# Patient Record
Sex: Female | Born: 1990
Health system: Southern US, Community
[De-identification: ages and names within clinical notes are randomized; demographics above are authoritative.]

## PROBLEM LIST (undated history)

## (undated) ENCOUNTER — Inpatient Hospital Stay (HOSPITAL_COMMUNITY): Payer: Self-pay

## (undated) ENCOUNTER — Inpatient Hospital Stay (HOSPITAL_COMMUNITY): Payer: Medicaid Other

## (undated) DIAGNOSIS — J40 Bronchitis, not specified as acute or chronic: Secondary | ICD-10-CM

## (undated) DIAGNOSIS — F4310 Post-traumatic stress disorder, unspecified: Secondary | ICD-10-CM

## (undated) DIAGNOSIS — G479 Sleep disorder, unspecified: Secondary | ICD-10-CM

## (undated) DIAGNOSIS — G4700 Insomnia, unspecified: Secondary | ICD-10-CM

## (undated) DIAGNOSIS — F419 Anxiety disorder, unspecified: Secondary | ICD-10-CM

## (undated) DIAGNOSIS — K912 Postsurgical malabsorption, not elsewhere classified: Secondary | ICD-10-CM

## (undated) DIAGNOSIS — E282 Polycystic ovarian syndrome: Secondary | ICD-10-CM

## (undated) DIAGNOSIS — G4730 Sleep apnea, unspecified: Secondary | ICD-10-CM

## (undated) DIAGNOSIS — S61259A Open bite of unspecified finger without damage to nail, initial encounter: Secondary | ICD-10-CM

## (undated) HISTORY — PX: GASTRIC BYPASS: SHX52

## (undated) HISTORY — DX: Bronchitis, not specified as acute or chronic: J40

## (undated) HISTORY — PX: MOUTH SURGERY: SHX715

## (undated) HISTORY — PX: WISDOM TOOTH EXTRACTION: SHX21

---
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
## 2011-06-17 ENCOUNTER — Inpatient Hospital Stay (HOSPITAL_COMMUNITY)
Admission: EM | Admit: 2011-06-17 | Discharge: 2011-06-20 | DRG: 203 | Disposition: A | Discharge status: Home / Self Care | Payer: Self-pay | Attending: Internal Medicine | Admitting: Internal Medicine

## 2011-06-17 ENCOUNTER — Emergency Department (HOSPITAL_COMMUNITY): Payer: Self-pay

## 2011-06-17 DIAGNOSIS — I498 Other specified cardiac arrhythmias: Secondary | ICD-10-CM | POA: Diagnosis present

## 2011-06-17 DIAGNOSIS — F410 Panic disorder [episodic paroxysmal anxiety] without agoraphobia: Secondary | ICD-10-CM | POA: Diagnosis present

## 2011-06-17 DIAGNOSIS — F17200 Nicotine dependence, unspecified, uncomplicated: Secondary | ICD-10-CM | POA: Diagnosis present

## 2011-06-17 DIAGNOSIS — T380X5A Adverse effect of glucocorticoids and synthetic analogues, initial encounter: Secondary | ICD-10-CM | POA: Diagnosis not present

## 2011-06-17 DIAGNOSIS — J45901 Unspecified asthma with (acute) exacerbation: Principal | ICD-10-CM | POA: Diagnosis present

## 2011-06-17 DIAGNOSIS — Z79899 Other long term (current) drug therapy: Secondary | ICD-10-CM

## 2011-06-17 DIAGNOSIS — D72829 Elevated white blood cell count, unspecified: Secondary | ICD-10-CM | POA: Diagnosis not present

## 2011-06-17 DIAGNOSIS — T486X5A Adverse effect of antiasthmatics, initial encounter: Secondary | ICD-10-CM | POA: Diagnosis present

## 2011-06-18 ENCOUNTER — Inpatient Hospital Stay (HOSPITAL_COMMUNITY): Payer: Self-pay

## 2011-06-18 LAB — RAPID URINE DRUG SCREEN, HOSP PERFORMED
Amphetamines: NOT DETECTED
Barbiturates: NOT DETECTED
Benzodiazepines: NOT DETECTED
Cocaine: NOT DETECTED
Opiates: POSITIVE — AB
Tetrahydrocannabinol: NOT DETECTED

## 2011-06-18 LAB — CK TOTAL AND CKMB (NOT AT ARMC)
CK, MB: 2 ng/mL (ref 0.3–4.0)
Relative Index: INVALID (ref 0.0–2.5)
Total CK: 81 U/L (ref 7–177)

## 2011-06-18 LAB — URINALYSIS, ROUTINE W REFLEX MICROSCOPIC
Bilirubin Urine: NEGATIVE
Glucose, UA: >1000 mg/dL — AB
Hgb urine dipstick: NEGATIVE
Ketones, ur: 15 mg/dL — AB
Leukocytes, UA: NEGATIVE
Nitrite: NEGATIVE
Protein, ur: NEGATIVE mg/dL
Specific Gravity, Urine: 1.04 — ABNORMAL HIGH (ref 1.005–1.030)
Urobilinogen, UA: 0.2 mg/dL (ref 0.0–1.0)
pH: 5.5 (ref 5.0–8.0)

## 2011-06-18 LAB — POCT I-STAT 3, ART BLOOD GAS (G3+)
Acid-base deficit: 7 mmol/L — ABNORMAL HIGH (ref 0.0–2.0)
Bicarbonate: 16.2 mEq/L — ABNORMAL LOW (ref 20.0–24.0)
O2 Saturation: 97 %
TCO2: 17 mmol/L (ref 0–100)
pCO2 arterial: 26.1 mmHg — ABNORMAL LOW (ref 35.0–45.0)
pH, Arterial: 7.401 — ABNORMAL HIGH (ref 7.350–7.400)
pO2, Arterial: 84 mmHg (ref 80.0–100.0)

## 2011-06-18 LAB — URINE MICROSCOPIC-ADD ON

## 2011-06-18 LAB — DIFFERENTIAL
Basophils Absolute: 0 10*3/uL (ref 0.0–0.1)
Basophils Relative: 0 % (ref 0–1)
Eosinophils Absolute: 0 10*3/uL (ref 0.0–0.7)
Eosinophils Relative: 0 % (ref 0–5)
Lymphocytes Relative: 13 % (ref 12–46)
Lymphs Abs: 1.4 10*3/uL (ref 0.7–4.0)
Monocytes Absolute: 0.3 10*3/uL (ref 0.1–1.0)
Monocytes Relative: 3 % (ref 3–12)
Neutro Abs: 8.8 10*3/uL — ABNORMAL HIGH (ref 1.7–7.7)
Neutrophils Relative %: 84 % — ABNORMAL HIGH (ref 43–77)

## 2011-06-18 LAB — POCT I-STAT, CHEM 8
BUN: 11 mg/dL (ref 6–23)
Calcium, Ion: 1.12 mmol/L (ref 1.12–1.32)
Chloride: 109 mEq/L (ref 96–112)
Creatinine, Ser: 1 mg/dL (ref 0.50–1.10)
Glucose, Bld: 133 mg/dL — ABNORMAL HIGH (ref 70–99)
HCT: 41 % (ref 36.0–46.0)
Hemoglobin: 13.9 g/dL (ref 12.0–15.0)
Potassium: 3.5 mEq/L (ref 3.5–5.1)
Sodium: 138 mEq/L (ref 135–145)
TCO2: 17 mmol/L (ref 0–100)

## 2011-06-18 LAB — CBC
HCT: 41.4 % (ref 36.0–46.0)
Hemoglobin: 14.8 g/dL (ref 12.0–15.0)
MCH: 30 pg (ref 26.0–34.0)
MCHC: 35.7 g/dL (ref 30.0–36.0)
MCV: 84 fL (ref 78.0–100.0)
Platelets: 240 10*3/uL (ref 150–400)
RBC: 4.93 MIL/uL (ref 3.87–5.11)
RDW: 12.2 % (ref 11.5–15.5)
WBC: 10.5 10*3/uL (ref 4.0–10.5)

## 2011-06-18 LAB — D-DIMER, QUANTITATIVE (NOT AT ARMC): D-Dimer, Quant: 0.22 ug/mL-FEU (ref 0.00–0.48)

## 2011-06-18 LAB — POCT PREGNANCY, URINE: Preg Test, Ur: NEGATIVE

## 2011-06-18 LAB — TROPONIN I: Troponin I: <0.3 ng/mL (ref <0.30)

## 2011-06-18 NOTE — H&P (Signed)
NAMESHARLETT, [name]              ACCOUNT NO.:  [id number]  MEDICAL RECORD NO.:  [id number]  LOCATION:  [facility]                         FACILITY:  [facility]  PHYSICIAN:  Osvaldo Shipper, MD     DATE OF BIRTH:  Feb 10, 1991  DATE OF ADMISSION:  06/18/2011 DATE OF DISCHARGE:                             HISTORY & PHYSICAL   PRIMARY CARE PHYSICIAN:  Social research officer, government in [place], [place].  ADMISSION DIAGNOSES: 1. Acute asthma exacerbation. 2. Tachycardia, likely as a result of respiratory failure and     distress. 3. History of anxiety disorder and panic attacks. 4. Tobacco abuse. 5. Nausea and vomiting as a result of the above.  CHIEF COMPLAINT:  Shortness of breath and cough for the last 4-5 days.  HISTORY OF PRESENT ILLNESS:  The patient is a 20 year old Caucasian female with a past medical history of asthma who was in her usual state of health until this past Monday when she started having facial pain, some sinus tenderness.  On Tuesday, she had shortness of breath, choking spells, and some cough.  On Wednesday, she also developed chest tightness along with other symptoms and so she went to an urgent care center.  She was diagnosed with bronchitis and sinusitis, was prescribed Bactrim, Allegra, and Xopenex inhaler.  She tried taking all these medicines.  Her symptoms keep kept getting worse.  She kept having more wheezing, more difficulty breathing and so she decided to come into the hospital tonight for further evaluation.  The cough has yellowish- greenish expectoration with specks of blood in it.  She tells me that as a result of her severe coughing spells she has nausea and vomiting.  She has not been able to eat or drink anything for the last couple of days. Denies any fever per se, but has not been able to check her temperature at home, but does admit to her chills and sweats.  She does have a history of asthma, has had bronchitis, but never this severe.   Chest tightness is mainly associated with the shortness of breath and cough.  MEDICATIONS AT HOME:  Bactrim 800 x 60 one tablet twice daily.  This was prescribed in the urgent care.  She is also on Allegra and albuterol inhaler and the Xopenex nebulizers.  She is also on NuvaRing at home and Ativan 1 mg as needed for sleep and for anxiety.  ALLERGIES:  No known drug allergies.  PAST MEDICAL HISTORY:  Positive for seasonal asthma, bronchitis.  She has had wisdom tooth removal, but denies any other surgical procedures. Denies any other medical problems.  SOCIAL HISTORY:  Lives in [place] with her fiance.  She works as a Patent attorney.  She smokes 1 pack of cigarettes, which lasts her a week ago.  Occasional alcohol use.  No illicit drug use.  FAMILY HISTORY:  Father side has diabetes, heart failure, liver disease, and lung cancer.  Mother side has heart disease and asthma.  REVIEW OF SYSTEMS:  GENERAL:  Positive for profound weakness and malaise.  HEENT:  Positive for facial pain.  CARDIOVASCULAR:  As in HPI. RESPIRATORY:  As in HPI.  GI:  Unremarkable except as in HPI.  GU: Unremarkable.  NEUROLOGICAL:  Unremarkable.  PSYCHIATRIC:  Unremarkable. DERMATOLOGICAL:  Unremarkable.  Other systems reviewed and found to be negative.  PHYSICAL EXAMINATION:  VITAL SIGNS:  Temperature 98.1.  Blood pressure initially when she came in was 150/93, subsequently 133/74.  Heart rate ranging from anywhere from 90-140s, right now appears to be regular. Respiratory rate is about 24-30 breaths per minute.  Saturation 97-100% on room air. GENERAL:  Obese, white female, in moderate discomfort, but in no distress at this time. HEENT:  Head is normocephalic and atraumatic.  Pupils are equal and reacting.  No pallor.  No icterus.  Oral mucous membranes are slightly dry.  No oral lesions are noted.  She does have sinus tenderness.  She had right frontal sinus tenderness more than the left and  she also has bilateral maxillary sinus tenderness.  There is also some erythema over her maxillary sinuses over the face. NECK:  Soft and supple.  No thyromegaly is appreciated.  No cervical, supraclavicular, or inguinal lymphadenopathy is present. LUNGS:  Diffuse wheezing bilaterally without any definite crackles. CARDIOVASCULAR:  S1 and S2, tachycardic, regular.  No S3, S4, rubs, murmurs, or bruits.  No pedal edema.  No JVD. ABDOMEN:  Soft, nontender, and nondistended.  It is obese.  No organomegaly is appreciated.  Bowel sounds are present. GU:  Deferred. MUSCULOSKELETAL:  Normal muscle mass and tone. NEUROLOGIC:  She is alert and oriented x3.  No focal neurological deficits are present. SKIN:  Does not reveal any rashes.  LAB DATA:  Her CBC shows a normal white count, hemoglobin is 13.9, platelet count is 240.  There are 84% neutrophils on the differential. Electrolytes are all normal.  Renal function is normal.  She had a chest x-ray here which showed moderate changes of bronchitis and/or asthma without any acute other cardiopulmonary process.  EKG is pending at this time.  D-dimer ordered by the ED physician is also pending.  Cardiac enzymes are pending.  ASSESSMENT:  This is a 20 year old Caucasian female with a history of seasonal asthma who presents with 4-5 day history of worsening shortness of breath, wheezing, cough, and most likely had acute asthma exacerbation.  She also has facial tenderness and could have acute sinusitis as well.  She has sinus tachycardia, which is most likely from her respiratory failure.  She also has tobacco abuse.  PLAN: 1. Acute asthma exacerbation.  Will be treated with steroids,     antibiotics, and nebulizer treatments.  We will also give her     inhaled steroids.  Because of her work of breathing and the     tachycardia, she will be monitored in the Step-Down Unit for the     next 12 hours. 2. Sinus tachycardia.  We will get an EKG to  evaluate this further.     We will avoid albuterol and give her Xopenex instead. 3. Facial tenderness, possible sinusitis.  We will get a CT of her     sinuses to evaluate this further. 4. History of anxiety and panic attacks.  Give her Ativan as needed. 5. History of tobacco abuse.  Put her on nicotine patch. 6. We will check a urine pregnancy test.  ABG is also pending at this time.  Her nausea and vomiting is probably posttussive.  We will give her ice chips for now and as her symptoms improve her diet can be advanced.  DVT prophylaxis will be initiated.  Further management decisions will depend on results of further testing  and patient's response to treatment.   Osvaldo Shipper, MD     GK/MEDQ  D:  06/18/2011  T:  06/18/2011  Job:  [id number]  cc:   [facility]  Electronically Signed by Osvaldo Shipper MD on 06/18/2011 10:13:08 PM

## 2011-06-19 LAB — BASIC METABOLIC PANEL
BUN: 11 mg/dL (ref 6–23)
CO2: 21 mEq/L (ref 19–32)
Calcium: 9.8 mg/dL (ref 8.4–10.5)
Chloride: 107 mEq/L (ref 96–112)
Creatinine, Ser: 0.62 mg/dL (ref 0.50–1.10)
GFR calc Af Amer: >90 mL/min (ref >90)
GFR calc non Af Amer: >90 mL/min (ref >90)
Glucose, Bld: 160 mg/dL — ABNORMAL HIGH (ref 70–99)
Potassium: 4.3 mEq/L (ref 3.5–5.1)
Sodium: 139 mEq/L (ref 135–145)

## 2011-06-19 LAB — CBC
HCT: 39.7 % (ref 36.0–46.0)
Hemoglobin: 13.6 g/dL (ref 12.0–15.0)
MCH: 29.7 pg (ref 26.0–34.0)
MCHC: 34.3 g/dL (ref 30.0–36.0)
MCV: 86.7 fL (ref 78.0–100.0)
Platelets: 240 10*3/uL (ref 150–400)
RBC: 4.58 MIL/uL (ref 3.87–5.11)
RDW: 12.5 % (ref 11.5–15.5)
WBC: 16.6 10*3/uL — ABNORMAL HIGH (ref 4.0–10.5)

## 2011-06-21 NOTE — Discharge Summary (Signed)
NAMEJERRIS, [name]              ACCOUNT NO.:  [id number]  MEDICAL RECORD NO.:  [id number]  LOCATION:  4706                         FACILITY:  [facility]  PHYSICIAN:  Jeoffrey Massed, MD    DATE OF BIRTH:  13-Dec-1990  DATE OF ADMISSION:  06/17/2011 DATE OF DISCHARGE:                        DISCHARGE SUMMARY - REFERRING   PRIMARY CARE PRACTITIONER:  At [facility] in [place].  PRIMARY DISCHARGE DIAGNOSES: 1. Acute asthma exacerbation. 2. Sinus tachycardia secondary to anxiety and albuterol, now resolved. 3. Ongoing tobacco abuse.  SECONDARY DISCHARGE DIAGNOSES/PAST MEDICAL HISTORY: 1. History of bronchial asthma. 2. History of anxiety disorder and panic attacks.  DISCHARGE MEDICATIONS: 1. Flovent 110 mcg 2 puffs inhaled twice daily. 2. Avelox 400 mg one tablet daily. 3. Albuterol inhaler 1-2 puffs inhaled 4 times a day p.r.n. 4. Allegra one tablet p.o. daily. 5. Lorazepam 0.5 mg one tablet p.o. daily at bedtime. 6. NuvaRing one ring per vaginally monthly. 7. Promethazine DM one teaspoon p.o. 4 times a day p.r.n. cough. 8. Xopenex nebulizer 0.63 mg one nebulizer inhaled q.4-6 h. p.r.n. 9. Prednisone 40 mg one tablet p.o. daily from June 21, 2011 to     June 23, 2011. 10.Prednisone 30 mg p.o. daily from June 24, 2011 to June 26, 2011. 11.Prednisone 20 mg p.o. daily from June 27, 2011 to June 29, 2011. 12.Prednisone 10 mg p.o. daily from June 30, 2011 to July 02, 2011 and discontinue.  CONSULTANTS ON THE CASE:  None.  BRIEF HISTORY OF PRESENT ILLNESS:  The patient is a 20 year old Caucasian female with the above-noted medical issues, who was brought in for shortness of breath and tachycardia.  For further details, please see the history and physical that was dictated by Dr. Osvaldo Shipper.  PERTINENT RADIOLOGICAL STUDIES: 1. X-ray of the chest done on June 17, 2011 showed moderate changes     of  bronchitis and asthma. 2. CT of the maxillofacial region without contrast showed mucosal     thickening in the paranasal sinus, compatible with chronic     sinusitis.  BRIEF HOSPITAL COURSE: 1. Bronchial asthma exacerbation plus the patient presented with     shortness of breath and tachycardia.  Although, she was admitted to     the step-down unit, she quickly made improvement and was then     downgraded to a regular medical bed.  She was given intravenous     steroids and nebulized albuterol and Atrovent.  With this she made     very quick clinical improvement and currently is almost back to her     usual baseline.  She will be discharged on a tapering dose of     steroids and the above-noted inhalers. 2. Tachycardia.  This was sinus tachycardia secondary to respiratory     distress, anxiety, and perhaps albuterol nebulization.  She is now     significantly better and her heart rate is down back to sinus     rhythm.  PHYSICAL EXAMINATION ON DISCHARGE:  VITAL SIGNS:  Shows a temperature to be afebrile, heart rate of 74, respiratory rate of 16, and a pulse  ox of 94% on room air.  Her blood pressure is 136/76. GENERAL:  Lying comfortably on bed, awake, alert with clear speech.  She is not in any distress, easily speaking full sentences. CHEST:  Bilaterally clear to auscultation without any rhonchi heard today. CARDIOVASCULAR:  S1, S2 regular.  No murmurs heard. ABDOMEN:  Soft, nontender.  The patient is slightly obese. EXTREMITIES:  No edema. NEUROLOGIC:  Nonfocal.  DISPOSITION:  The patient is deemed stable to be discharged home.  FOLLOW-UP INSTRUCTIONS:  The patient is to follow up with the primary care practitioner at [facility] in the next one week or so.  Total time for discharge equals 45 minutes.     Jeoffrey Massed, MD     SG/MEDQ  D:  06/20/2011  T:  06/20/2011  Job:  [id number]  cc:   [facility]  Electronically Signed by Jeoffrey Massed  on 06/21/2011 12:37:26 PM

## 2011-08-31 ENCOUNTER — Emergency Department (HOSPITAL_COMMUNITY)
Admission: EM | Admit: 2011-08-31 | Discharge: 2011-09-01 | Disposition: A | Discharge status: Home / Self Care | Payer: Self-pay | Attending: Emergency Medicine | Admitting: Emergency Medicine

## 2011-08-31 ENCOUNTER — Emergency Department (HOSPITAL_COMMUNITY): Payer: Self-pay

## 2011-08-31 ENCOUNTER — Encounter (HOSPITAL_COMMUNITY): Payer: Self-pay | Admitting: *Deleted

## 2011-08-31 ENCOUNTER — Other Ambulatory Visit: Payer: Self-pay

## 2011-08-31 DIAGNOSIS — J3489 Other specified disorders of nose and nasal sinuses: Secondary | ICD-10-CM | POA: Insufficient documentation

## 2011-08-31 DIAGNOSIS — R059 Cough, unspecified: Secondary | ICD-10-CM | POA: Insufficient documentation

## 2011-08-31 DIAGNOSIS — R6883 Chills (without fever): Secondary | ICD-10-CM | POA: Insufficient documentation

## 2011-08-31 DIAGNOSIS — R197 Diarrhea, unspecified: Secondary | ICD-10-CM | POA: Insufficient documentation

## 2011-08-31 DIAGNOSIS — R109 Unspecified abdominal pain: Secondary | ICD-10-CM | POA: Insufficient documentation

## 2011-08-31 DIAGNOSIS — J029 Acute pharyngitis, unspecified: Secondary | ICD-10-CM | POA: Insufficient documentation

## 2011-08-31 DIAGNOSIS — J45909 Unspecified asthma, uncomplicated: Secondary | ICD-10-CM | POA: Insufficient documentation

## 2011-08-31 DIAGNOSIS — K5289 Other specified noninfective gastroenteritis and colitis: Secondary | ICD-10-CM | POA: Insufficient documentation

## 2011-08-31 DIAGNOSIS — K529 Noninfective gastroenteritis and colitis, unspecified: Secondary | ICD-10-CM

## 2011-08-31 DIAGNOSIS — R05 Cough: Secondary | ICD-10-CM | POA: Insufficient documentation

## 2011-08-31 DIAGNOSIS — R112 Nausea with vomiting, unspecified: Secondary | ICD-10-CM | POA: Insufficient documentation

## 2011-08-31 LAB — DIFFERENTIAL
Basophils Relative: 0 % (ref 0–1)
Eosinophils Absolute: 0.3 10*3/uL (ref 0.0–0.7)
Monocytes Absolute: 0.7 10*3/uL (ref 0.1–1.0)
Monocytes Relative: 7 % (ref 3–12)
Neutro Abs: 7.3 10*3/uL (ref 1.7–7.7)

## 2011-08-31 LAB — CBC
HCT: 46 % (ref 36.0–46.0)
Hemoglobin: 16.7 g/dL — ABNORMAL HIGH (ref 12.0–15.0)
MCH: 30.3 pg (ref 26.0–34.0)
MCHC: 36.3 g/dL — ABNORMAL HIGH (ref 30.0–36.0)

## 2011-08-31 LAB — COMPREHENSIVE METABOLIC PANEL
Albumin: 4 g/dL (ref 3.5–5.2)
BUN: 16 mg/dL (ref 6–23)
Chloride: 104 mEq/L (ref 96–112)
Creatinine, Ser: 0.74 mg/dL (ref 0.50–1.10)
GFR calc Af Amer: >90 mL/min (ref >90)
GFR calc non Af Amer: >90 mL/min (ref >90)
Glucose, Bld: 92 mg/dL (ref 70–99)
Total Bilirubin: 0.6 mg/dL (ref 0.3–1.2)

## 2011-08-31 LAB — URINALYSIS, ROUTINE W REFLEX MICROSCOPIC
Ketones, ur: 15 mg/dL — AB
Leukocytes, UA: NEGATIVE
Nitrite: NEGATIVE
Protein, ur: NEGATIVE mg/dL
Urobilinogen, UA: 0.2 mg/dL (ref 0.0–1.0)
pH: 5 (ref 5.0–8.0)

## 2011-08-31 LAB — LIPASE, BLOOD: Lipase: 17 U/L (ref 11–59)

## 2011-08-31 MED ORDER — FENTANYL CITRATE 0.05 MG/ML IJ SOLN
50.0000 ug | Freq: Once | INTRAMUSCULAR | Status: AC
  Administered 2011-08-31: 50 ug via INTRAVENOUS
  Filled 2011-08-31: qty 2

## 2011-08-31 MED ORDER — ONDANSETRON HCL 4 MG/2ML IJ SOLN
4.0000 mg | Freq: Once | INTRAMUSCULAR | Status: AC
  Administered 2011-09-01: 4 mg via INTRAVENOUS
  Filled 2011-08-31: qty 2

## 2011-08-31 MED ORDER — SODIUM CHLORIDE 0.9 % IV BOLUS (SEPSIS)
1000.0000 mL | Freq: Once | INTRAVENOUS | Status: AC
  Administered 2011-09-01: 1000 mL via INTRAVENOUS

## 2011-08-31 MED ORDER — ONDANSETRON HCL 4 MG/2ML IJ SOLN
4.0000 mg | Freq: Once | INTRAMUSCULAR | Status: AC
  Administered 2011-08-31: 4 mg via INTRAVENOUS
  Filled 2011-08-31: qty 2

## 2011-08-31 MED ORDER — SODIUM CHLORIDE 0.9 % IV BOLUS (SEPSIS)
1000.0000 mL | Freq: Once | INTRAVENOUS | Status: AC
  Administered 2011-08-31: 1000 mL via INTRAVENOUS

## 2011-08-31 NOTE — ED Notes (Signed)
Pt c/o n/v/d for the past 3 days.  States has not been able to keep fluids down.  Last diarrhea-today at 9 o clock.  HR elevated

## 2011-08-31 NOTE — ED Notes (Addendum)
Pt is vomiting and c/o  more intense stomach pain than previously experienced this pm.

## 2011-08-31 NOTE — ED Provider Notes (Signed)
History     CSN: [id number]  Arrival date & time 08/31/11  2138   First MD Initiated Contact with Patient 08/31/11 2206     HPI Patient reports for 3 days had upper respiratory tract infection. States symptoms included rhinorrhea congestion ear pain sore throat and cough. States states also had nausea vomiting and diarrhea. Reports upper respiratory tract infection improved. States nausea vomiting and diarrhea has worsened. Reports having diffuse cramping abdominal pain. States that she is unable to tolerate solids or liquids now. Reports her boyfriend has the same symptoms. Patient is a 21 y.o. female presenting with vomiting. The history is provided by the patient.  Emesis  This is a new problem. The current episode started more than 2 days ago. The problem has not changed since onset.There has been no fever. Associated symptoms include abdominal pain, chills, cough, diarrhea and URI. Pertinent negatives include no headaches.    Past Medical History  Diagnosis Date  . Endometriosis   . Asthma     Past Surgical History  Procedure Date  . Mouth surgery     History reviewed. No pertinent family history.  History  Substance Use Topics  . Smoking status: Current Everyday Smoker -- 0.2 packs/day  . Smokeless tobacco: Not on file  . Alcohol Use: Yes    OB History    Grav Para Term Preterm Abortions TAB SAB Ect Mult Living                  Review of Systems  Constitutional: Positive for chills and fatigue.  HENT: Positive for ear pain and sinus pressure.   Respiratory: Positive for cough.   Gastrointestinal: Positive for nausea, vomiting, abdominal pain and diarrhea.  Genitourinary: Negative for dysuria, urgency and hematuria.  Neurological: Negative for dizziness, numbness and headaches.  All other systems reviewed and are negative.    Allergies  Amoxicillin  Home Medications   Current Outpatient Rx  Name Route Sig Dispense Refill  . LORAZEPAM 1 MG PO TABS Oral  Take 1 mg by mouth every 8 (eight) hours as needed. For anxiety/sleep    . MULTI-VITAMIN/MINERALS PO TABS Oral Take 1 tablet by mouth daily.    Marland Kitchen ALKA-SELTZER PLS NIGHT CLD/FLU PO Oral Take 1 tablet by mouth every 4 (four) hours as needed. For cold symptoms      BP 104/86  Pulse 128  Temp(Src) 98.8 F (37.1 C) (Oral)  Resp 17  SpO2 99%  LMP 08/13/2011  Physical Exam  Vitals reviewed. Constitutional: She is oriented to person, place, and time. Vital signs are normal. She appears well-developed and well-nourished. She has a sickly appearance. She appears ill.  HENT:  Head: Normocephalic and atraumatic.  Right Ear: External ear normal.  Left Ear: External ear normal.  Nose: Nose normal.  Mouth/Throat: Oropharynx is clear and moist. No oropharyngeal exudate.  Eyes: Conjunctivae and EOM are normal. Pupils are equal, round, and reactive to light.  Neck: Normal range of motion. Neck supple.  Cardiovascular: Regular rhythm and normal heart sounds.  Tachycardia present.  Exam reveals no friction rub.   No murmur heard. Pulmonary/Chest: Effort normal and breath sounds normal. She has no decreased breath sounds. She has no wheezes. She has no rhonchi. She has no rales. She exhibits no tenderness.  Abdominal: Soft. Bowel sounds are normal. She exhibits no distension and no mass. There is no hepatosplenomegaly. There is generalized tenderness. There is no rebound, no guarding, no CVA tenderness, no tenderness at McBurney's point and  negative Murphy's sign.  Musculoskeletal: Normal range of motion.  Neurological: She is alert and oriented to person, place, and time. Coordination normal.  Skin: Skin is warm and dry. No rash noted. No erythema. No pallor.    ED Course  Procedures   Date: 09/01/2011  Rate: 121  Rhythm: sinus tachycardia  QRS Axis: normal  Intervals: normal  ST/T Wave abnormalities: normal  Conduction Disutrbances:none  Narrative Interpretation:   Old EKG Reviewed:  unchanged  Results for orders placed during the hospital encounter of 08/31/11  CBC      Component Value Range   WBC 9.8  4.0 - 10.5 (K/uL)   RBC 5.51 (*) 3.87 - 5.11 (MIL/uL)   Hemoglobin 16.7 (*) 12.0 - 15.0 (g/dL)   HCT 24.4  01.0 - 27.2 (%)   MCV 83.5  78.0 - 100.0 (fL)   MCH 30.3  26.0 - 34.0 (pg)   MCHC 36.3 (*) 30.0 - 36.0 (g/dL)   RDW 53.6  64.4 - 03.4 (%)   Platelets 224  150 - 400 (K/uL)  DIFFERENTIAL      Component Value Range   Neutrophils Relative 74  43 - 77 (%)   Neutro Abs 7.3  1.7 - 7.7 (K/uL)   Lymphocytes Relative 15  12 - 46 (%)   Lymphs Abs 1.5  0.7 - 4.0 (K/uL)   Monocytes Relative 7  3 - 12 (%)   Monocytes Absolute 0.7  0.1 - 1.0 (K/uL)   Eosinophils Relative 3  0 - 5 (%)   Eosinophils Absolute 0.3  0.0 - 0.7 (K/uL)   Basophils Relative 0  0 - 1 (%)   Basophils Absolute 0.0  0.0 - 0.1 (K/uL)  COMPREHENSIVE METABOLIC PANEL      Component Value Range   Sodium 137  135 - 145 (mEq/L)   Potassium 3.8  3.5 - 5.1 (mEq/L)   Chloride 104  96 - 112 (mEq/L)   CO2 20  19 - 32 (mEq/L)   Glucose, Bld 92  70 - 99 (mg/dL)   BUN 16  6 - 23 (mg/dL)   Creatinine, Ser 7.42  0.50 - 1.10 (mg/dL)   Calcium 59.5  8.4 - 10.5 (mg/dL)   Total Protein 7.8  6.0 - 8.3 (g/dL)   Albumin 4.0  3.5 - 5.2 (g/dL)   AST 30  0 - 37 (U/L)   ALT 45 (*) 0 - 35 (U/L)   Alkaline Phosphatase 108  39 - 117 (U/L)   Total Bilirubin 0.6  0.3 - 1.2 (mg/dL)   GFR calc non Af Amer >90  >90 (mL/min)   GFR calc Af Amer >90  >90 (mL/min)  LIPASE, BLOOD      Component Value Range   Lipase 17  11 - 59 (U/L)  URINALYSIS, ROUTINE W REFLEX MICROSCOPIC      Component Value Range   Color, Urine AMBER (*) YELLOW    APPearance CLOUDY (*) CLEAR    Specific Gravity, Urine 1.034 (*) 1.005 - 1.030    pH 5.0  5.0 - 8.0    Glucose, UA NEGATIVE  NEGATIVE (mg/dL)   Hgb urine dipstick NEGATIVE  NEGATIVE    Bilirubin Urine SMALL (*) NEGATIVE    Ketones, ur 15 (*) NEGATIVE (mg/dL)   Protein, ur NEGATIVE   NEGATIVE (mg/dL)   Urobilinogen, UA 0.2  0.0 - 1.0 (mg/dL)   Nitrite NEGATIVE  NEGATIVE    Leukocytes, UA NEGATIVE  NEGATIVE   POCT PREGNANCY, URINE  Component Value Range   Preg Test, Ur NEGATIVE     Dg Abd Acute W/chest  08/31/2011  *RADIOLOGY REPORT*  Clinical Data: Nausea, vomiting, diarrhea.  ACUTE ABDOMEN SERIES (ABDOMEN 2 VIEW & CHEST 1 VIEW)  Comparison: 06/17/2011.  Findings: Normal bowel gas pattern.  No free air underneath the hemidiaphragms.  No organomegaly.  Mild levoconvex scoliosis.  No dilated loops of large or small bowel. Chronic elevation of the right hemidiaphragm.  No free air underneath the hemidiaphragms. No acute cardiopulmonary disease.  IMPRESSION: No acute abnormality.  Original Report Authenticated By: Andreas Newport, M.D.     MDM   Reports feeling significantly improved after medication. Has been able to keep down apple juice in the ED. Will discharge how with antiemetics and analgesics.    Abe People, PA-C 09/01/11 0038

## 2011-09-01 MED ORDER — TRAMADOL HCL 50 MG PO TABS: 50.0000 mg | ORAL_TABLET | Freq: Four times a day (QID) | ORAL | Status: AC | PRN

## 2011-09-01 MED ORDER — TRAMADOL HCL 50 MG PO TABS: 50.0000 mg | ORAL_TABLET | Freq: Four times a day (QID) | ORAL | Status: DC | PRN

## 2011-09-01 MED ORDER — ONDANSETRON 8 MG PO TBDP: 8.0000 mg | ORAL_TABLET | Freq: Three times a day (TID) | ORAL | Status: DC | PRN

## 2011-09-01 MED ORDER — ONDANSETRON 8 MG PO TBDP: 8.0000 mg | ORAL_TABLET | Freq: Three times a day (TID) | ORAL | Status: AC | PRN

## 2011-09-01 NOTE — ED Notes (Signed)
Pt reports having nausea but not actively vomiting. Pt has no had any episodes of diarrhea. Plan of care is updated with verbal understanding, will continue to monitor pt.

## 2011-09-01 NOTE — ED Provider Notes (Signed)
Medical screening examination/treatment/procedure(s) were performed by non-physician practitioner and as supervising physician I was immediately available for consultation/collaboration.   Earlee Herald L Leslyn Monda, MD 09/01/11 1452 

## 2011-09-01 NOTE — ED Notes (Signed)
Pt was given apple juice appx 240 ml. Pt has drinking juice with no vomiting observed or reported. Pt now request ice water "im so thirsty". Pt given PO fluids, plan of care is updated with verbal understanding. Pt is awaiting completion of IV Fluids prior to discharge.

## 2011-09-02 NOTE — ED Notes (Signed)
Patient called requesting change in RX, stating Zofran too expensive. Change in RX by Trixie Dredge PA to Phenergan 12.5 mg tab, one PO every eight hours prn nausea. Dispense #20, called to [facility] on [address] per patient request.

## 2011-11-02 ENCOUNTER — Emergency Department (HOSPITAL_COMMUNITY): Payer: Self-pay

## 2011-11-02 ENCOUNTER — Encounter (HOSPITAL_COMMUNITY): Payer: Self-pay | Admitting: *Deleted

## 2011-11-02 ENCOUNTER — Emergency Department (HOSPITAL_COMMUNITY)
Admission: EM | Admit: 2011-11-02 | Discharge: 2011-11-03 | Disposition: A | Discharge status: Home / Self Care | Payer: Self-pay | Attending: Emergency Medicine | Admitting: Emergency Medicine

## 2011-11-02 DIAGNOSIS — F17200 Nicotine dependence, unspecified, uncomplicated: Secondary | ICD-10-CM | POA: Insufficient documentation

## 2011-11-02 DIAGNOSIS — R070 Pain in throat: Secondary | ICD-10-CM | POA: Insufficient documentation

## 2011-11-02 DIAGNOSIS — R42 Dizziness and giddiness: Secondary | ICD-10-CM | POA: Insufficient documentation

## 2011-11-02 DIAGNOSIS — J069 Acute upper respiratory infection, unspecified: Secondary | ICD-10-CM | POA: Insufficient documentation

## 2011-11-02 DIAGNOSIS — R05 Cough: Secondary | ICD-10-CM | POA: Insufficient documentation

## 2011-11-02 DIAGNOSIS — R0602 Shortness of breath: Secondary | ICD-10-CM | POA: Insufficient documentation

## 2011-11-02 DIAGNOSIS — J3489 Other specified disorders of nose and nasal sinuses: Secondary | ICD-10-CM | POA: Insufficient documentation

## 2011-11-02 DIAGNOSIS — Z79899 Other long term (current) drug therapy: Secondary | ICD-10-CM | POA: Insufficient documentation

## 2011-11-02 DIAGNOSIS — J45901 Unspecified asthma with (acute) exacerbation: Secondary | ICD-10-CM | POA: Insufficient documentation

## 2011-11-02 DIAGNOSIS — R059 Cough, unspecified: Secondary | ICD-10-CM | POA: Insufficient documentation

## 2011-11-02 NOTE — ED Notes (Signed)
Called x1, no answer

## 2011-11-02 NOTE — ED Notes (Signed)
Coughing for 2-3 days and diff breathing worse today.  Seasonal allergies.

## 2011-11-02 NOTE — ED Notes (Signed)
Updated on POC.  Family remains with pt, no distress noted.

## 2011-11-03 ENCOUNTER — Encounter (HOSPITAL_COMMUNITY): Payer: Self-pay | Admitting: Emergency Medicine

## 2011-11-03 ENCOUNTER — Other Ambulatory Visit: Payer: Self-pay

## 2011-11-03 MED ORDER — PREDNISONE 20 MG PO TABS
60.0000 mg | ORAL_TABLET | Freq: Once | ORAL | Status: AC
  Administered 2011-11-03: 60 mg via ORAL
  Filled 2011-11-03: qty 3

## 2011-11-03 MED ORDER — LEVALBUTEROL HCL 1.25 MG/0.5ML IN NEBU
1.2500 mg | INHALATION_SOLUTION | RESPIRATORY_TRACT | Status: AC
  Administered 2011-11-03: 1.25 mg via RESPIRATORY_TRACT
  Filled 2011-11-03: qty 0.5

## 2011-11-03 MED ORDER — BENZONATATE 100 MG PO CAPS
200.0000 mg | ORAL_CAPSULE | Freq: Once | ORAL | Status: AC
  Administered 2011-11-03: 200 mg via ORAL
  Filled 2011-11-03: qty 2

## 2011-11-03 MED ORDER — IPRATROPIUM BROMIDE 0.02 % IN SOLN
0.5000 mg | Freq: Once | RESPIRATORY_TRACT | Status: AC
  Administered 2011-11-03: 0.5 mg via RESPIRATORY_TRACT
  Filled 2011-11-03: qty 2.5

## 2011-11-03 MED ORDER — SODIUM CHLORIDE 0.9 % IV BOLUS (SEPSIS): 1000.0000 mL | Freq: Once | INTRAVENOUS | Status: DC

## 2011-11-03 MED ORDER — PREDNISONE 50 MG PO TABS: 50.0000 mg | ORAL_TABLET | Freq: Every day | ORAL | Status: AC

## 2011-11-03 NOTE — Discharge Instructions (Signed)
 Asthma, Acute Bronchospasm  Your exam shows you have asthma, or acute bronchospasm that acts like asthma. Bronchospasm means your air passages become narrowed. These conditions are due to inflammation and airway spasm that cause narrowing of the bronchial tubes in the lungs. This causes you to have wheezing and shortness of breath.  CAUSES    Respiratory infections and allergies most often bring on these attacks. Smoking, air pollution, cold air, emotional upsets, and vigorous exercise can also bring them on.    TREATMENT     Treatment is aimed at making the narrowed airways larger. Mild asthma/bronchospasm is usually controlled with inhaled medicines. Albuterol is a common medicine that you breathe in to open spastic or narrowed airways. Some trade names for albuterol are Ventolin or Proventil. Steroid medicine is also used to reduce the inflammation when an attack is moderate or severe. Antibiotics (medications used to kill germs) are only used if a bacterial infection is present.    If you are pregnant and need to use Albuterol (Ventolin or Proventil), you can expect the baby to move more than usual shortly after the medicine is used.   HOME CARE INSTRUCTIONS     Rest.    Drink plenty of liquids. This helps the mucus to remain thin and easily coughed up. Do not use caffeine or alcohol.    Do not smoke. Avoid being exposed to second-hand smoke.    You play a critical role in keeping yourself in good health. Avoid exposure to things that cause you to wheeze. Avoid exposure to things that cause you to have breathing problems. Keep your medications up-to-date and available. Carefully follow your doctor's treatment plan.    When pollen or pollution is bad, keep windows closed and use an air conditioner go to places with air conditioning. If you are allergic to furry pets or birds, find new homes for them or keep them outside.    Take your medicine exactly as prescribed.     Asthma requires careful medical attention. See your caregiver for follow-up as advised. If you are more than [redacted] weeks pregnant and you were prescribed any new medications, let your Obstetrician know about the visit and how you are doing. Arrange a recheck.   SEEK IMMEDIATE MEDICAL CARE IF:     You are getting worse.    You have trouble breathing. If severe, call 911.    You develop chest pain or discomfort.    You are throwing up or not drinking fluids.    You are not getting better within 24 hours.    You are coughing up yellow, green, brown, or bloody sputum.    You develop a fever over 102 F (38.9 C).    You have trouble swallowing.   MAKE SURE YOU:     Understand these instructions.    Will watch your condition.    Will get help right away if you are not doing well or get worse.   Document Released: 11/22/2006 Document Revised: 07/27/2011 Document Reviewed: 07/22/2007  [facility] Patient Information 2012 [place], [place].

## 2011-11-03 NOTE — ED Provider Notes (Signed)
History     CSN: [id number]  Arrival date & time 11/02/11  1843   First MD Initiated Contact with Patient 11/03/11 [phone]      Chief Complaint  Patient presents with  . Shortness of Breath    (Consider location/radiation/quality/duration/timing/severity/associated sxs/prior treatment) HPI Comments: Patient notes that she has a history of seasonal allergies with asthma set off a change of seasons.  Over the last 2 days she's had cold and sinus-type symptoms.  She's had some increased difficulty breathing similar to prior episodes without relief with her nebulizers at home.  No specific fevers.  No specific chest pain.  Patient presents for the worsening shortness of breath the  Patient is a 21 y.o. female presenting with shortness of breath. The history is provided by the patient. No language interpreter was used.  Shortness of Breath  The current episode started yesterday. The problem has been gradually worsening. The problem is moderate. The symptoms are relieved by nothing. The symptoms are aggravated by activity. Associated symptoms include rhinorrhea, sore throat, cough, shortness of breath and wheezing. Pertinent negatives include no chest pain and no fever.    Past Medical History  Diagnosis Date  . Endometriosis   . Asthma     Past Surgical History  Procedure Date  . Mouth surgery     History reviewed. No pertinent family history.  History  Substance Use Topics  . Smoking status: Current Everyday Smoker -- 0.2 packs/day  . Smokeless tobacco: Not on file  . Alcohol Use: Yes    OB History    Grav Para Term Preterm Abortions TAB SAB Ect Mult Living                  Review of Systems  Constitutional: Negative.  Negative for fever and chills.  HENT: Positive for sore throat and rhinorrhea.   Eyes: Negative.  Negative for discharge and redness.  Respiratory: Positive for cough, shortness of breath and wheezing.   Cardiovascular: Negative.  Negative for chest pain.    Gastrointestinal: Negative.  Negative for nausea, vomiting, abdominal pain and diarrhea.  Genitourinary: Negative.  Negative for dysuria and vaginal discharge.  Musculoskeletal: Negative.  Negative for back pain.  Skin: Negative.  Negative for color change and rash.  Neurological: Negative.  Negative for syncope and headaches.  Hematological: Negative.  Negative for adenopathy.  Psychiatric/Behavioral: Negative.  Negative for confusion.  All other systems reviewed and are negative.    Allergies  Amoxicillin and Albuterol sulfate  Home Medications   Current Outpatient Rx  Name Route Sig Dispense Refill  . LORAZEPAM 1 MG PO TABS Oral Take 1 mg by mouth every 8 (eight) hours as needed. For anxiety/sleep    . MULTI-VITAMIN/MINERALS PO TABS Oral Take 1 tablet by mouth daily.    Dorothyann Peng MULTI-SYMPTOM PO Oral Take 2 capsules by mouth once.      BP 115/60  Pulse 83  Temp(Src) 98.8 F (37.1 C) (Oral)  Resp 21  SpO2 98%  LMP 09/22/2011  Physical Exam  Nursing note and vitals reviewed. Constitutional: She is oriented to person, place, and time. She appears well-developed and well-nourished.  Non-toxic appearance. She does not have a sickly appearance.  HENT:  Head: Normocephalic and atraumatic.  Eyes: Conjunctivae, EOM and lids are normal. Pupils are equal, round, and reactive to light. No scleral icterus.  Neck: Trachea normal and normal range of motion. Neck supple.  Cardiovascular: Normal rate, regular rhythm and normal heart sounds.   Pulmonary/Chest:  Effort normal. No respiratory distress. She has wheezes.       Patient had inspiratory and expiratory wheezing with decreased breath sounds on exam  Abdominal: Soft. Normal appearance. There is no tenderness. There is no rebound, no guarding and no CVA tenderness.  Musculoskeletal: Normal range of motion.  Neurological: She is alert and oriented to person, place, and time. She has normal strength.  Skin: Skin is warm, dry and  intact. No rash noted.  Psychiatric: She has a normal mood and affect. Her behavior is normal. Judgment and thought content normal.    ED Course  Procedures (including critical care time)  Labs Reviewed - No data to display Dg Chest 2 View  11/02/2011  *RADIOLOGY REPORT*  Clinical Data: Short of breath.  Cough.  Dizziness.  CHEST - 2 VIEW  Comparison:  06/17/2011  Findings:  The heart size and mediastinal contours are within normal limits.  Both lungs are clear.  The visualized skeletal structures are unremarkable.  IMPRESSION: No active cardiopulmonary disease.  Original Report Authenticated By: Danae Orleans, M.D.     No diagnosis found.    MDM  Patient came here for a mild asthma exacerbation.  She's received nebulizer treatments here and does feel much better.  Patient wishes to go home at this point in time and I will discharge her with a prednisone prescription.        Nat Christen, MD 11/03/11 [phone]

## 2012-01-01 ENCOUNTER — Encounter (HOSPITAL_COMMUNITY): Payer: Self-pay | Admitting: Emergency Medicine

## 2012-01-01 ENCOUNTER — Emergency Department (INDEPENDENT_AMBULATORY_CARE_PROVIDER_SITE_OTHER): Payer: Self-pay

## 2012-01-01 ENCOUNTER — Emergency Department (INDEPENDENT_AMBULATORY_CARE_PROVIDER_SITE_OTHER)
Admission: EM | Admit: 2012-01-01 | Discharge: 2012-01-01 | Disposition: A | Discharge status: Home / Self Care | Payer: Self-pay | Source: Home / Self Care | Attending: Emergency Medicine | Admitting: Emergency Medicine

## 2012-01-01 DIAGNOSIS — S9030XA Contusion of unspecified foot, initial encounter: Secondary | ICD-10-CM

## 2012-01-01 DIAGNOSIS — S9032XA Contusion of left foot, initial encounter: Secondary | ICD-10-CM

## 2012-01-01 MED ORDER — MELOXICAM 15 MG PO TABS: 15.0000 mg | ORAL_TABLET | Freq: Every day | ORAL | Status: DC

## 2012-01-01 MED ORDER — TRAMADOL HCL 50 MG PO TABS: 100.0000 mg | ORAL_TABLET | Freq: Three times a day (TID) | ORAL | Status: AC | PRN

## 2012-01-01 NOTE — ED Notes (Signed)
PT HERE WITH LEFT FOOT PAIN RADIATING TO L CALF S/P INJURY Thursday.PT STATES SHE WAS IN A SLEEP NIGHT TERROR AND WAS KICKING HEADBOARD MULTIPLE TIMES ACCORDING TO BOYFRIEND AS WITNESS BUT PT DOESN'T RECALL.TAKES ATIVAN FOR ANXIETY BUT RAN OUT  AND RELATES INJURY TO ATTACK.NO BRUISING OR SWELLING SEEN BUT REDNESS AND TOP OF FOOT PAIN

## 2012-01-01 NOTE — ED Notes (Signed)
ICE APPLIED TO LEFT FOOT AND ELEVATED

## 2012-01-01 NOTE — Discharge Instructions (Signed)
Bone Bruise  A bone bruise is a small hidden fracture of the bone. It typically occurs with bones located close to the surface of the skin.  SYMPTOMS  The pain lasts longer than a normal bruise.   The bruised area is difficult to use.   There may be discoloration or swelling of the bruised area.   When a bone bruise is found with injury to the anterior cruciate ligament (in the knee) there is often an increased:   Amount of fluid in the knee   Time the fluid in the knee lasts.   Number of days until you are walking normally and regaining the motion you had before the injury.   Number of days with pain from the injury.  DIAGNOSIS  It can only be seen on X-rays known as MRIs. This stands for magnetic resonance imaging. A regular X-ray taken of a bone bruise would appear to be normal. A bone bruise is a common injury in the knee and the heel bone (calcaneus). The problems are similar to those produced by stress fractures, which are bone injuries caused by overuse. A bone bruise may also be a sign of other injuries. For example, bone bruises are commonly found where an anterior cruciate ligament (ACL) in the knee has been pulled away from the bone (ruptured). A ligament is a tough fibrous material that connects bones together to make our joints stable. Bruises of the bone last a lot longer than bruises of the muscle or tissues beneath the skin. Bone bruises can last from days to months and are often more severe and painful than other bruises. TREATMENT Because bone bruises are sudden injuries you cannot often prevent them, other than by being extremely careful. Some things you can do to improve the condition are:  Apply ice to the sore area for 15 to 20 minutes, 3 to 4 times per day while awake for the first 2 days. Put the ice in a plastic bag, and place a towel between the bag of ice and your skin.   Keep your bruised area raised (elevated) when possible to lessen swelling.   For activity:     Use crutches when necessary; do not put weight on the injured leg until you are no longer tender.   You may walk on your affected part as the pain allows, or as instructed.   Start weight bearing gradually on the bruised part.   Continue to use crutches or a cane until you can stand without causing pain, or as instructed.   If a plaster splint was applied, wear the splint until you are seen for a follow-up examination. Rest it on nothing harder than a pillow the first 24 hours. Do not put weight on it. Do not get it wet. You may take it off to take a shower or bath.   If an air splint was applied, more air may be blown into or out of the splint as needed for comfort. You may take it off at night and to take a shower or bath.   Wiggle your toes in the splint several times per day if you are able.   You may have been given an elastic bandage to use with the plaster splint or alone. The splint is too tight if you have numbness, tingling or if your foot becomes cold and blue. Adjust the bandage to make it comfortable.   Only take over-the-counter or prescription medicines for pain, discomfort, or fever as directed by   your caregiver.   Follow all instructions for follow up with your caregiver. This includes any orthopedic referrals, physical therapy, and rehabilitation. Any delay in obtaining necessary care could result in a delay or failure of the bones to heal.  SEEK MEDICAL CARE IF:   You have an increase in bruising, swelling, or pain.   You notice coldness of your toes.   You do not get pain relief with medications.  SEEK IMMEDIATE MEDICAL CARE IF:   Your toes are numb or blue.   You have severe pain not controlled with medications.   If any of the problems that caused you to seek care are becoming worse.  Document Released: 10/28/2003 Document Revised: 07/27/2011 Document Reviewed: 03/11/2008 [employer] Patient Information 2012 [employer].  Contusion A contusion is a deep  bruise. Contusions are the result of an injury that caused bleeding under the skin. The contusion may turn blue, purple, or yellow. Minor injuries will give you a painless contusion, but more severe contusions may stay painful and swollen for a few weeks.  CAUSES  A contusion is usually caused by a blow, trauma, or direct force to an area of the body. SYMPTOMS   Swelling and redness of the injured area.   Bruising of the injured area.   Tenderness and soreness of the injured area.   Pain.  DIAGNOSIS  The diagnosis can be made by taking a history and physical exam. An X-ray, CT scan, or MRI may be needed to determine if there were any associated injuries, such as fractures. TREATMENT  Specific treatment will depend on what area of the body was injured. In general, the best treatment for a contusion is resting, icing, elevating, and applying cold compresses to the injured area. Over-the-counter medicines may also be recommended for pain control. Ask your caregiver what the best treatment is for your contusion. HOME CARE INSTRUCTIONS   Put ice on the injured area.   Put ice in a plastic bag.   Place a towel between your skin and the bag.   Leave the ice on for 15 to 20 minutes, 3 to 4 times a day.   Only take over-the-counter or prescription medicines for pain, discomfort, or fever as directed by your caregiver. Your caregiver may recommend avoiding anti-inflammatory medicines (aspirin, ibuprofen, and naproxen) for 48 hours because these medicines may increase bruising.   Rest the injured area.   If possible, elevate the injured area to reduce swelling.  SEEK IMMEDIATE MEDICAL CARE IF:   You have increased bruising or swelling.   You have pain that is getting worse.   Your swelling or pain is not relieved with medicines.  MAKE SURE YOU:   Understand these instructions.   Will watch your condition.   Will get help right away if you are not doing well or get worse.  Document  Released: 05/17/2005 Document Revised: 07/27/2011 Document Reviewed: 06/12/2011 [employer] Patient Information 2012 [employer]. 

## 2012-01-01 NOTE — ED Provider Notes (Signed)
Chief Complaint  Patient presents with  . Foot Injury    History of Present Illness:   The patient is a 21 year old female who injured her right foot 6 days ago. She has night terrors and kicked the headboard of her bed. Ever since then she's had pain over the dorsum of the foot without any swelling or bruising. It hurts somewhat to bear weight, but she is able to walk but with a length. She denies any numbness or tingling.  Review of Systems:  Other than noted above, the patient denies any of the following symptoms: Systemic:  No fevers, chills, sweats, or aches.  No fatigue or tiredness. Musculoskeletal:  No joint pain, arthritis, bursitis, swelling, back pain, or neck pain. Neurological:  No muscular weakness, paresthesias, headache, or trouble with speech or coordination.  No dizziness.   PMFSH:  Past medical history, family history, social history, meds, and allergies were reviewed.  Physical Exam:   Vital signs:  LMP 12/24/2011 Gen:  Alert and oriented times 3.  In no distress. Musculoskeletal: She has pain to palpation over the first metatarsal. There is no swelling, bruising, or deformity. Otherwise, all joints had a full a ROM with no swelling, bruising or deformity.  No edema, pulses full. Extremities were warm and pink.  Capillary refill was brisk.  Skin:  Clear, warm and dry.  No rash. Neuro:  Alert and oriented times 3.  Muscle strength was normal.  Sensation was intact to light touch.   Radiology:  Dg Foot Complete Right  01/01/2012  *RADIOLOGY REPORT*  Clinical Data: Foot trauma, pain at superior aspect of left foot  LEFT FOOT THREE VIEWS:  Comparison: None  Findings: Tiny plantar calcaneal spur. Bone mineralization normal. Joint spaces preserved. No fracture, dislocation, or bone destruction.  IMPRESSION: No acute osseous abnormalities.  Original Report Authenticated By: Lollie Marrow, M.D.    Assessment:  The encounter diagnosis was Contusion of left foot.  Plan:   1.   The following meds were prescribed:   New Prescriptions   MELOXICAM (MOBIC) 15 MG TABLET    Take 1 tablet (15 mg total) by mouth daily.   TRAMADOL (ULTRAM) 50 MG TABLET    Take 2 tablets (100 mg total) by mouth every 8 (eight) hours as needed for pain.   2.  The patient was instructed in symptomatic care, including rest and activity, elevation, application of ice and compression.  She was placed in a postoperative boot and given crutches.  Appropriate handouts were given. 3.  The patient was told to return if becoming worse in any way, if no better in 3 or 4 days, and given some red flag symptoms that would indicate earlier return.   4.  The patient was told to follow up with her primary care physician if no better in 2 weeks.   Reuben Likes, MD 01/01/12 [phone]

## 2012-03-31 ENCOUNTER — Emergency Department (HOSPITAL_COMMUNITY): Payer: Self-pay

## 2012-03-31 ENCOUNTER — Emergency Department (HOSPITAL_COMMUNITY)
Admission: EM | Admit: 2012-03-31 | Discharge: 2012-04-01 | Disposition: A | Discharge status: Home / Self Care | Payer: Self-pay | Attending: Emergency Medicine | Admitting: Emergency Medicine

## 2012-03-31 ENCOUNTER — Encounter (HOSPITAL_COMMUNITY): Payer: Self-pay | Admitting: *Deleted

## 2012-03-31 DIAGNOSIS — N949 Unspecified condition associated with female genital organs and menstrual cycle: Secondary | ICD-10-CM | POA: Insufficient documentation

## 2012-03-31 DIAGNOSIS — R112 Nausea with vomiting, unspecified: Secondary | ICD-10-CM | POA: Insufficient documentation

## 2012-03-31 DIAGNOSIS — R10819 Abdominal tenderness, unspecified site: Secondary | ICD-10-CM | POA: Insufficient documentation

## 2012-03-31 DIAGNOSIS — R109 Unspecified abdominal pain: Secondary | ICD-10-CM | POA: Insufficient documentation

## 2012-03-31 LAB — CBC WITH DIFFERENTIAL/PLATELET
HCT: 40.4 % (ref 36.0–46.0)
Hemoglobin: 14.2 g/dL (ref 12.0–15.0)
Lymphs Abs: 2.3 10*3/uL (ref 0.7–4.0)
Monocytes Relative: 14 % — ABNORMAL HIGH (ref 3–12)
Neutro Abs: 2 10*3/uL (ref 1.7–7.7)
Neutrophils Relative %: 38 % — ABNORMAL LOW (ref 43–77)
RBC: 4.7 MIL/uL (ref 3.87–5.11)

## 2012-03-31 LAB — POCT I-STAT, CHEM 8
BUN: 14 mg/dL (ref 6–23)
Chloride: 108 mEq/L (ref 96–112)
Creatinine, Ser: 0.8 mg/dL (ref 0.50–1.10)
Potassium: 4.6 mEq/L (ref 3.5–5.1)
Sodium: 142 mEq/L (ref 135–145)

## 2012-03-31 LAB — URINALYSIS, ROUTINE W REFLEX MICROSCOPIC
Bilirubin Urine: NEGATIVE
Glucose, UA: NEGATIVE mg/dL
Leukocytes, UA: NEGATIVE
Nitrite: NEGATIVE
Specific Gravity, Urine: 1.028 (ref 1.005–1.030)
pH: 5 (ref 5.0–8.0)

## 2012-03-31 MED ORDER — HYDROMORPHONE HCL PF 1 MG/ML IJ SOLN
1.0000 mg | Freq: Once | INTRAMUSCULAR | Status: AC
  Administered 2012-03-31: 1 mg via INTRAVENOUS
  Filled 2012-03-31: qty 1

## 2012-03-31 MED ORDER — SODIUM CHLORIDE 0.9 % IV BOLUS (SEPSIS)
1000.0000 mL | Freq: Once | INTRAVENOUS | Status: AC
  Administered 2012-03-31: 1000 mL via INTRAVENOUS

## 2012-03-31 MED ORDER — HYDROMORPHONE HCL PF 2 MG/ML IJ SOLN
2.0000 mg | Freq: Once | INTRAMUSCULAR | Status: AC
  Administered 2012-03-31: 2 mg via INTRAVENOUS
  Filled 2012-03-31: qty 1

## 2012-03-31 MED ORDER — HYDROMORPHONE HCL PF 1 MG/ML IJ SOLN
1.0000 mg | Freq: Once | INTRAMUSCULAR | Status: AC
  Administered 2012-03-31: 1 mg via INTRAVENOUS

## 2012-03-31 MED ORDER — KETOROLAC TROMETHAMINE 30 MG/ML IJ SOLN
30.0000 mg | Freq: Once | INTRAMUSCULAR | Status: AC
  Administered 2012-03-31: 30 mg via INTRAVENOUS
  Filled 2012-03-31: qty 1

## 2012-03-31 MED ORDER — HYDROMORPHONE HCL PF 2 MG/ML IJ SOLN
2.0000 mg | Freq: Once | INTRAMUSCULAR | Status: DC
  Filled 2012-03-31: qty 2

## 2012-03-31 NOTE — ED Notes (Signed)
Pt presented to ED with acute abdo pain with nausea and vomiting.

## 2012-03-31 NOTE — ED Notes (Signed)
Patient transported to Ultrasound 

## 2012-03-31 NOTE — ED Notes (Signed)
Pt has hx of endometriosis.  Pt unsure last menstral.  States at 4pm had RLQ cramp and then severe pain that made her drop to her knees with nausea, trembling, and chills.  Pt is having waves of severe lower abdominal pain and nausea

## 2012-03-31 NOTE — ED Provider Notes (Signed)
History     CSN: [id number]  Arrival date & time 03/31/12  1804   First MD Initiated Contact with Patient 03/31/12 1906      Chief Complaint  Patient presents with  . Abdominal Pain  . Nausea    (Consider location/radiation/quality/duration/timing/severity/associated sxs/prior treatment) HPI Comments: Patient reports a 4 hour history of RLQ abdominal. The pain started suddenly when she was at work. She described the pain as sharp and excruciating. She reports associated nausea and vomiting from the pain. She has not taken anything for the pain and has never experienced this before. Her last normal period was 6 weeks ago. She denies injury or any other symptoms.   Patient is a 21 y.o. female presenting with abdominal pain.  Abdominal Pain The primary symptoms of the illness include abdominal pain, nausea and vomiting. The primary symptoms of the illness do not include fever, fatigue, shortness of breath, diarrhea or vaginal discharge.  Symptoms associated with the illness do not include chills, diaphoresis or constipation.    Past Medical History  Diagnosis Date  . Endometriosis   . Asthma     Past Surgical History  Procedure Date  . Mouth surgery     No family history on file.  History  Substance Use Topics  . Smoking status: Current Everyday Smoker -- 0.2 packs/day  . Smokeless tobacco: Not on file  . Alcohol Use: Yes    OB History    Grav Para Term Preterm Abortions TAB SAB Ect Mult Living                  Review of Systems  Constitutional: Negative for fever, chills, diaphoresis and fatigue.  Respiratory: Negative for shortness of breath and wheezing.   Cardiovascular: Negative for chest pain.  Gastrointestinal: Positive for nausea, vomiting and abdominal pain. Negative for diarrhea and constipation.  Genitourinary: Negative for flank pain and vaginal discharge.  Skin: Negative for rash and wound.  Neurological: Negative for dizziness, weakness, numbness  and headaches.    Allergies  Amoxicillin and Albuterol sulfate  Home Medications   Current Outpatient Rx  Name Route Sig Dispense Refill  . CALCIUM CARBONATE ANTACID 500 MG PO CHEW Oral Chew 2 tablets by mouth daily as needed. For upset stomach    . CLONAZEPAM 1 MG PO TABS Oral Take 1 mg by mouth at bedtime as needed. For sleep    . FAMOTIDINE 10 MG PO TABS Oral Take 10 mg by mouth 2 (two) times daily as needed. For indigestion    . IBUPROFEN 200 MG PO TABS Oral Take 400 mg by mouth every 6 (six) hours as needed. For pain      Pulse 95  Temp 98.3 F (36.8 C) (Oral)  Resp 18  SpO2 99%  Physical Exam  Nursing note and vitals reviewed. Constitutional: She is oriented to person, place, and time. She appears well-developed and well-nourished. She appears distressed.       Patient is crying.  HENT:  Head: Normocephalic and atraumatic.  Eyes: Conjunctivae are normal. No scleral icterus.  Neck: Normal range of motion.  Cardiovascular: Normal rate and regular rhythm.  Exam reveals no gallop and no friction rub.   No murmur heard. Pulmonary/Chest: Effort normal and breath sounds normal. No respiratory distress. She has no wheezes. She has no rales. She exhibits no tenderness.  Abdominal: Soft. She exhibits no distension.       Generalized tenderness to palpation most notable in the lower mid abdomen. Guarding of  lower abdomen.  Musculoskeletal: Normal range of motion.  Neurological: She is alert and oriented to person, place, and time.  Skin: Skin is warm and dry. She is not diaphoretic.  Psychiatric: She has a normal mood and affect. Her behavior is normal.    ED Course  Procedures (including critical care time)  Labs Reviewed  CBC WITH DIFFERENTIAL - Abnormal; Notable for the following:    Neutrophils Relative 38 (*)     Monocytes Relative 14 (*)     All other components within normal limits  POCT I-STAT, CHEM 8 - Abnormal; Notable for the following:    Glucose, Bld 104 (*)      Calcium, Ion 1.30 (*)     All other components within normal limits  URINALYSIS, ROUTINE W REFLEX MICROSCOPIC  POCT PREGNANCY, URINE   No results found.   No diagnosis found.    MDM  1:15 AM Patient shows a normal ultrasound and will be discharged with Percocet. She can be discharged with pain medication. Plan discussed with Dr. Dierdre Highman who is agreeable.         Emilia Beck, PA-C 04/01/12 [phone]

## 2012-03-31 NOTE — ED Notes (Signed)
Pt presented to ED with sever abdo pain with nausea and vomiting.

## 2012-03-31 NOTE — ED Notes (Signed)
Unsuccessful blood draw attempt lab informed.  

## 2012-03-31 NOTE — ED Provider Notes (Signed)
Patient moved to CDU holding for pelvic ultrasound.  Signout received from Dr Weldon Inches.  Patient with sudden onset abdominal pain, N/V.  Possible history of endometriosis.  Pt is to get pelvic US, concern for ruptured ovarian cyst or endometriosis.    10:58 PM Pt has arrived to CDU.  Patient reports no needs at this time.  Pain is 8/10 but states she recently received dilaudid.  Pt currently being taken to ultrasound.    12:09 AM Patient currently in ultrasound.  Emilia Beck, PA-C, to reassume care of patient at change of shift pending pelvic US.    [place], [place] 04/01/12 0010

## 2012-03-31 NOTE — ED Provider Notes (Signed)
Medical screening examination/treatment/procedure(s) were conducted as a shared visit with non-physician practitioner(s) and myself.  I personally evaluated the patient during the encounter 21 year old, morbidly obese, female, who says that she may of been diagnosed with endometriosis in the past.  Presents to emergency department with sudden onset of abdominal pain, and nausea and vomiting.  She was at work, sitting on her symptoms began.  She has not had fevers, chills, diarrhea, urinary tract symptoms.  She denies vaginal discharge or bleeding.  On, examination.  She is morbidly obese crying and a lot of discomfort.  She has lower abdominal tenderness.  Concern is that she has endometriosis or a ruptured ovarian cyst.  We will perform laboratory testing, and an ultrasound of her pelvis.  We will give her IV analgesics for the pain.  Cheri Guppy, MD 03/31/12 [phone]

## 2012-04-01 MED ORDER — HYDROMORPHONE HCL PF 1 MG/ML IJ SOLN
1.0000 mg | Freq: Once | INTRAMUSCULAR | Status: AC
  Administered 2012-04-01: 1 mg via INTRAVENOUS
  Filled 2012-04-01: qty 1

## 2012-04-01 MED ORDER — OXYCODONE-ACETAMINOPHEN 5-325 MG PO TABS: 2.0000 | ORAL_TABLET | ORAL | Status: AC | PRN

## 2012-04-01 NOTE — ED Provider Notes (Signed)
Medical screening examination/treatment/procedure(s) were conducted as a shared visit with non-physician practitioner(s) and myself.  I personally evaluated the patient during the encounter  Ellora Varnum, MD 04/01/12 0712 

## 2012-04-01 NOTE — ED Provider Notes (Signed)
Medical screening examination/treatment/procedure(s) were conducted as a shared visit with non-physician practitioner(s) and myself.  I personally evaluated the patient during the encounter  Cheri Guppy, MD 04/01/12 [phone]

## 2012-05-20 ENCOUNTER — Encounter (HOSPITAL_COMMUNITY): Payer: Self-pay | Admitting: Emergency Medicine

## 2012-05-20 ENCOUNTER — Emergency Department (HOSPITAL_COMMUNITY)
Admission: EM | Admit: 2012-05-20 | Discharge: 2012-05-20 | Disposition: A | Discharge status: Home / Self Care | Payer: Self-pay | Attending: Emergency Medicine | Admitting: Emergency Medicine

## 2012-05-20 DIAGNOSIS — Z881 Allergy status to other antibiotic agents status: Secondary | ICD-10-CM | POA: Insufficient documentation

## 2012-05-20 DIAGNOSIS — J45909 Unspecified asthma, uncomplicated: Secondary | ICD-10-CM | POA: Insufficient documentation

## 2012-05-20 DIAGNOSIS — Z888 Allergy status to other drugs, medicaments and biological substances status: Secondary | ICD-10-CM | POA: Insufficient documentation

## 2012-05-20 DIAGNOSIS — R1110 Vomiting, unspecified: Secondary | ICD-10-CM | POA: Insufficient documentation

## 2012-05-20 DIAGNOSIS — F17200 Nicotine dependence, unspecified, uncomplicated: Secondary | ICD-10-CM | POA: Insufficient documentation

## 2012-05-20 DIAGNOSIS — R197 Diarrhea, unspecified: Secondary | ICD-10-CM | POA: Insufficient documentation

## 2012-05-20 HISTORY — DX: Polycystic ovarian syndrome: E28.2

## 2012-05-20 LAB — URINALYSIS, ROUTINE W REFLEX MICROSCOPIC
Glucose, UA: NEGATIVE mg/dL
Hgb urine dipstick: NEGATIVE
Specific Gravity, Urine: 1.038 — ABNORMAL HIGH (ref 1.005–1.030)
Urobilinogen, UA: 0.2 mg/dL (ref 0.0–1.0)
pH: 5 (ref 5.0–8.0)

## 2012-05-20 LAB — POCT PREGNANCY, URINE: Preg Test, Ur: NEGATIVE

## 2012-05-20 MED ORDER — ONDANSETRON HCL 4 MG/2ML IJ SOLN
4.0000 mg | Freq: Once | INTRAMUSCULAR | Status: AC
  Administered 2012-05-20: 4 mg via INTRAVENOUS
  Filled 2012-05-20: qty 2

## 2012-05-20 MED ORDER — SODIUM CHLORIDE 0.9 % IV SOLN
INTRAVENOUS | Status: DC
  Administered 2012-05-20: 04:00:00 via INTRAVENOUS

## 2012-05-20 MED ORDER — HYDROMORPHONE HCL PF 1 MG/ML IJ SOLN
1.0000 mg | Freq: Once | INTRAMUSCULAR | Status: AC
  Administered 2012-05-20: 1 mg via INTRAVENOUS
  Filled 2012-05-20: qty 1

## 2012-05-20 MED ORDER — MORPHINE SULFATE 4 MG/ML IJ SOLN: 4.0000 mg | Freq: Once | INTRAMUSCULAR | Status: DC

## 2012-05-20 MED ORDER — ONDANSETRON HCL 8 MG PO TABS: 8.0000 mg | ORAL_TABLET | Freq: Three times a day (TID) | ORAL | Status: DC | PRN

## 2012-05-20 MED ORDER — SODIUM CHLORIDE 0.9 % IV BOLUS (SEPSIS)
1000.0000 mL | Freq: Once | INTRAVENOUS | Status: AC
  Administered 2012-05-20: 1000 mL via INTRAVENOUS

## 2012-05-20 MED ORDER — PROMETHAZINE HCL 25 MG PO TABS: 25.0000 mg | ORAL_TABLET | Freq: Four times a day (QID) | ORAL | Status: DC | PRN

## 2012-05-20 NOTE — ED Provider Notes (Signed)
History     CSN: [id number]  Arrival date & time 05/20/12  0246   First MD Initiated Contact with Patient 05/20/12 [phone]      Chief Complaint  Patient presents with  . Emesis    Patient is a 21 y.o. female presenting with vomiting. The history is provided by the patient.  Emesis  This is a new problem. The current episode started 2 days ago. The problem occurs 2 to 4 times per day. The problem has been gradually worsening. The emesis has an appearance of stomach contents. The maximum temperature recorded prior to her arrival was 101 to 101.9 F. Associated symptoms include abdominal pain, chills, diarrhea and a fever. Risk factors include ill contacts.  pt denies bloody emesis or bloody stool She reports abdominal cramping No dysuria is reported.  No vaginal bleeding is reported +sick contacts  Past Medical History  Diagnosis Date  . Endometriosis   . Asthma   . PCOS (polycystic ovarian syndrome)     Past Surgical History  Procedure Date  . Mouth surgery     No family history on file.  History  Substance Use Topics  . Smoking status: Current Every Day Smoker -- 0.2 packs/day  . Smokeless tobacco: Not on file  . Alcohol Use: Yes    OB History    Grav Para Term Preterm Abortions TAB SAB Ect Mult Living                  Review of Systems  Constitutional: Positive for fever and chills.  Gastrointestinal: Positive for vomiting, abdominal pain and diarrhea.  All other systems reviewed and are negative.    Allergies  Amoxicillin and Albuterol sulfate  Home Medications   Current Outpatient Rx  Name Route Sig Dispense Refill  . CALCIUM CARBONATE ANTACID 500 MG PO CHEW Oral Chew 2 tablets by mouth daily as needed. For upset stomach    . CLONAZEPAM 1 MG PO TABS Oral Take 1 mg by mouth at bedtime as needed. For sleep    . FAMOTIDINE 10 MG PO TABS Oral Take 10 mg by mouth 2 (two) times daily as needed. For indigestion    . IBUPROFEN 200 MG PO TABS Oral Take 400 mg by  mouth every 6 (six) hours as needed. For pain      BP 141/77  Pulse 94  Temp 98.4 F (36.9 C) (Oral)  Resp 14  Ht 5' 8.5" (1.74 m)  Wt 280 lb (127.007 kg)  BMI 41.95 kg/m2  SpO2 99%  Physical Exam CONSTITUTIONAL: Well developed/well nourished HEAD AND FACE: Normocephalic/atraumatic EYES: EOMI/PERRL, no icterus ENMT: Mucous membranes dry NECK: supple no meningeal signs SPINE:entire spine nontender CV: S1/S2 noted, no murmurs/rubs/gallops noted LUNGS: Lungs are clear to auscultation bilaterally, no apparent distress ABDOMEN: soft, nontender, no rebound or guarding GU:no cva tenderness NEURO: Pt is awake/alert, moves all extremitiesx4 EXTREMITIES: pulses normal, full ROM SKIN: warm, color normal PSYCH: no abnormalities of mood noted   ED Course  Procedures    Labs Reviewed  URINALYSIS, ROUTINE W REFLEX MICROSCOPIC   3:44 AM Pt with vomiting/diarrhea and feeling dizzy Will give IV fluids, pain meds/nausea meds and then reassess   6:40 AM Pt starting to feel improved No further vomiting Her abd pain is improved Discussed return precautions with patient She is requesting zofran and phenergen  MDM  Nursing notes including past medical history and social history reviewed and considered in documentation Labs/vital reviewed and considered  Joya Gaskins, MD 05/20/12 [phone]

## 2012-05-20 NOTE — ED Notes (Signed)
PT. REPORTS VOMITTING AND DIARRHEA WITH DIZZINESS / THIRSTY / GENERALIZED WEAKNESS/ABDOMINAL CRAMPING ONSET 2 DAYS AGO.

## 2012-07-23 DIAGNOSIS — J45901 Unspecified asthma with (acute) exacerbation: Secondary | ICD-10-CM | POA: Insufficient documentation

## 2012-07-23 DIAGNOSIS — Z79899 Other long term (current) drug therapy: Secondary | ICD-10-CM | POA: Insufficient documentation

## 2012-07-23 DIAGNOSIS — F17200 Nicotine dependence, unspecified, uncomplicated: Secondary | ICD-10-CM | POA: Insufficient documentation

## 2012-07-23 DIAGNOSIS — E282 Polycystic ovarian syndrome: Secondary | ICD-10-CM | POA: Insufficient documentation

## 2012-07-23 DIAGNOSIS — R05 Cough: Secondary | ICD-10-CM | POA: Insufficient documentation

## 2012-07-23 DIAGNOSIS — R071 Chest pain on breathing: Secondary | ICD-10-CM | POA: Insufficient documentation

## 2012-07-23 DIAGNOSIS — R059 Cough, unspecified: Secondary | ICD-10-CM | POA: Insufficient documentation

## 2012-07-23 DIAGNOSIS — N809 Endometriosis, unspecified: Secondary | ICD-10-CM | POA: Insufficient documentation

## 2012-07-24 ENCOUNTER — Emergency Department (HOSPITAL_COMMUNITY)
Admit: 2012-07-24 | Discharge: 2012-07-24 | Disposition: A | Discharge status: Home / Self Care | Payer: Self-pay | Attending: Emergency Medicine | Admitting: Emergency Medicine

## 2012-07-24 ENCOUNTER — Encounter (HOSPITAL_COMMUNITY): Payer: Self-pay | Admitting: Emergency Medicine

## 2012-07-24 ENCOUNTER — Emergency Department (HOSPITAL_COMMUNITY)
Admission: EM | Admit: 2012-07-24 | Discharge: 2012-07-24 | Disposition: A | Discharge status: Home / Self Care | Payer: Self-pay | Attending: Emergency Medicine | Admitting: Emergency Medicine

## 2012-07-24 DIAGNOSIS — J40 Bronchitis, not specified as acute or chronic: Secondary | ICD-10-CM

## 2012-07-24 DIAGNOSIS — J45901 Unspecified asthma with (acute) exacerbation: Secondary | ICD-10-CM

## 2012-07-24 LAB — CBC
HCT: 40.6 % (ref 36.0–46.0)
Hemoglobin: 14 g/dL (ref 12.0–15.0)
MCHC: 34.5 g/dL (ref 30.0–36.0)

## 2012-07-24 LAB — POCT I-STAT, CHEM 8
Chloride: 106 mEq/L (ref 96–112)
Creatinine, Ser: 0.9 mg/dL (ref 0.50–1.10)
Glucose, Bld: 115 mg/dL — ABNORMAL HIGH (ref 70–99)
Potassium: 3.5 mEq/L (ref 3.5–5.1)

## 2012-07-24 MED ORDER — PREDNISONE 20 MG PO TABS
40.0000 mg | ORAL_TABLET | Freq: Once | ORAL | Status: AC
  Administered 2012-07-24: 40 mg via ORAL
  Filled 2012-07-24: qty 2

## 2012-07-24 MED ORDER — AZITHROMYCIN 250 MG PO TABS: 250.0000 mg | ORAL_TABLET | Freq: Every day | ORAL | Status: DC

## 2012-07-24 MED ORDER — ALBUTEROL SULFATE HFA 108 (90 BASE) MCG/ACT IN AERS: 2.0000 | INHALATION_SPRAY | RESPIRATORY_TRACT | Status: DC | PRN

## 2012-07-24 MED ORDER — PREDNISONE 20 MG PO TABS: 40.0000 mg | ORAL_TABLET | Freq: Every day | ORAL | Status: DC

## 2012-07-24 MED ORDER — ALBUTEROL SULFATE (5 MG/ML) 0.5% IN NEBU
5.0000 mg | INHALATION_SOLUTION | Freq: Once | RESPIRATORY_TRACT | Status: AC
  Administered 2012-07-24: 5 mg via RESPIRATORY_TRACT

## 2012-07-24 NOTE — ED Notes (Addendum)
Patient with increase shortness of breath during the day today since 0300.  Audible wheezing heard with patient's arrival to ED.  Patient also states she is having chest pain with the shortness of breath, that started around 5pm today.

## 2012-07-24 NOTE — ED Provider Notes (Signed)
History     CSN: [id number]  Arrival date & time 07/23/12  2357   First MD Initiated Contact with Patient 07/24/12 0136      Chief Complaint  Patient presents with  . Shortness of Breath  . Chest Pain    (Consider location/radiation/quality/duration/timing/severity/associated sxs/prior treatment) HPI Comments: 21 year old female, with a history of intermittent asthma that is related to this is and who has had approximately 12 hours of difficulty breathing, she started coughing at 3:00, has had several albuterol treatments since that time with mild improvement, denies any chest pain or back pain. The symptoms are persistent, similar to prior episodes, not associated with fevers, chills, nausea, vomiting, swelling of the legs. She does admit that her cough is productive of a green sputum.  Patient is a 21 y.o. female presenting with shortness of breath and chest pain. The history is provided by the patient.  Shortness of Breath  Associated symptoms include chest pain and shortness of breath.  Chest Pain Primary symptoms include shortness of breath.     Past Medical History  Diagnosis Date  . Endometriosis   . Asthma   . PCOS (polycystic ovarian syndrome)     Past Surgical History  Procedure Date  . Mouth surgery     No family history on file.  History  Substance Use Topics  . Smoking status: Current Every Day Smoker -- 0.2 packs/day  . Smokeless tobacco: Not on file  . Alcohol Use: Yes    OB History    Grav Para Term Preterm Abortions TAB SAB Ect Mult Living                  Review of Systems  Respiratory: Positive for shortness of breath.   Cardiovascular: Positive for chest pain.  All other systems reviewed and are negative.    Allergies  Amoxicillin and Albuterol sulfate  Home Medications   Current Outpatient Rx  Name  Route  Sig  Dispense  Refill  . ALBUTEROL SULFATE HFA 108 (90 BASE) MCG/ACT IN AERS   Inhalation   Inhale 2 puffs into the lungs  every 4 (four) hours as needed for wheezing or shortness of breath.   1 Inhaler   3   . AZITHROMYCIN 250 MG PO TABS   Oral   Take 1 tablet (250 mg total) by mouth daily. 500mg  PO day 1, then 250mg  PO days 205   6 tablet   0   . CALCIUM CARBONATE ANTACID 500 MG PO CHEW   Oral   Chew 2 tablets by mouth daily as needed. For upset stomach         . ETONOGESTREL-ETHINYL ESTRADIOL 0.12-0.015 MG/24HR VA RING   Vaginal   Place 1 each vaginally every 28 (twenty-eight) days. Insert vaginally and leave in place for 3 consecutive weeks, then remove for 1 week.         Marland Kitchen FAMOTIDINE 10 MG PO TABS   Oral   Take 10 mg by mouth 2 (two) times daily as needed. For indigestion         . LORAZEPAM 2 MG PO TABS   Oral   Take 2 mg by mouth at bedtime as needed. For sleep         . ONDANSETRON HCL 8 MG PO TABS   Oral   Take 1 tablet (8 mg total) by mouth every 8 (eight) hours as needed for nausea.   20 tablet   0   . PREDNISONE 20 MG  PO TABS   Oral   Take 2 tablets (40 mg total) by mouth daily.   10 tablet   0   . PROMETHAZINE HCL 25 MG PO TABS   Oral   Take 1 tablet (25 mg total) by mouth every 6 (six) hours as needed for nausea.   30 tablet   0     BP 140/115  Pulse 114  Temp 98.1 F (36.7 C) (Oral)  Resp 19  SpO2 100%  LMP 07/15/2012  Physical Exam  Nursing note and vitals reviewed. Constitutional: She appears well-developed and well-nourished. No distress.  HENT:  Head: Normocephalic and atraumatic.  Mouth/Throat: Oropharynx is clear and moist. No oropharyngeal exudate.       Tympanic membranes clear bilaterally, oropharynx is clear and moist, no signs of erythema, exudate, asymmetry, hypertrophy. Nasal passages are clear  Eyes: Conjunctivae normal and EOM are normal. Pupils are equal, round, and reactive to light. Right eye exhibits no discharge. Left eye exhibits no discharge. No scleral icterus.  Neck: Normal range of motion. Neck supple. No JVD present. No  thyromegaly present.  Cardiovascular: Regular rhythm, normal heart sounds and intact distal pulses.  Exam reveals no gallop and no friction rub.   No murmur heard.      Tachycardia to 110  Pulmonary/Chest: Effort normal. No respiratory distress. She has wheezes ( Mild end expiratory wheezing, tachypnea to 22 breaths per minute but no accessory muscle use, speaks in full sentences.). She has no rales.  Abdominal: Soft. Bowel sounds are normal. She exhibits no distension and no mass. There is no tenderness.  Musculoskeletal: Normal range of motion. She exhibits no edema and no tenderness.  Lymphadenopathy:    She has no cervical adenopathy.  Neurological: She is alert. Coordination normal.  Skin: Skin is warm and dry. No rash noted. No erythema.  Psychiatric: She has a normal mood and affect. Her behavior is normal.    ED Course  Procedures (including critical care time)   Labs Reviewed  CBC   Dg Chest 2 View  07/24/2012  *RADIOLOGY REPORT*  Clinical Data: New onset of shortness of breath.  CHEST - 2 VIEW  Comparison: Chest radiograph performed 11/02/2011  Findings: The lungs are mildly hypoexpanded; minimal bibasilar atelectasis is noted.  No focal consolidation, pleural effusion or pneumothorax is seen.  The cardiomediastinal silhouette is within normal limits.  No acute osseous abnormalities are seen.  IMPRESSION: Lungs mildly hypoexpanded, with minimal bibasilar atelectasis.  No definite evidence of focal airspace consolidation.   Original Report Authenticated By: Tonia Ghent, M.D.      1. Asthma attack   2. Bronchitis       MDM  The patient states that she uses lead albuterol at home to minimize tachycardia, she was given albuterol neb on arrival though it did improve, it also causes her to be slightly tachycardic. She is not febrile, she is not hypoxic, I have personally interpreted her chest x-ray which is a 2 view PA and lateral of the chest which shows no signs of focal  infiltrates, pneumothorax or other abnormalities. She will be started on prednisone, albuterol, discharged home with Zithromax        Vida Roller, MD 07/24/12 [phone]

## 2012-09-13 ENCOUNTER — Emergency Department (HOSPITAL_COMMUNITY)
Admission: EM | Admit: 2012-09-13 | Discharge: 2012-09-13 | Disposition: A | Discharge status: Home / Self Care | Payer: Self-pay | Attending: Emergency Medicine | Admitting: Emergency Medicine

## 2012-09-13 ENCOUNTER — Encounter (HOSPITAL_COMMUNITY): Payer: Self-pay | Admitting: Emergency Medicine

## 2012-09-13 ENCOUNTER — Emergency Department (HOSPITAL_COMMUNITY): Payer: Self-pay

## 2012-09-13 DIAGNOSIS — F17200 Nicotine dependence, unspecified, uncomplicated: Secondary | ICD-10-CM | POA: Insufficient documentation

## 2012-09-13 DIAGNOSIS — X503XXA Overexertion from repetitive movements, initial encounter: Secondary | ICD-10-CM | POA: Insufficient documentation

## 2012-09-13 DIAGNOSIS — IMO0002 Reserved for concepts with insufficient information to code with codable children: Secondary | ICD-10-CM | POA: Insufficient documentation

## 2012-09-13 DIAGNOSIS — Y929 Unspecified place or not applicable: Secondary | ICD-10-CM | POA: Insufficient documentation

## 2012-09-13 DIAGNOSIS — Y939 Activity, unspecified: Secondary | ICD-10-CM | POA: Insufficient documentation

## 2012-09-13 DIAGNOSIS — Z79899 Other long term (current) drug therapy: Secondary | ICD-10-CM | POA: Insufficient documentation

## 2012-09-13 DIAGNOSIS — J45909 Unspecified asthma, uncomplicated: Secondary | ICD-10-CM | POA: Insufficient documentation

## 2012-09-13 DIAGNOSIS — S86919A Strain of unspecified muscle(s) and tendon(s) at lower leg level, unspecified leg, initial encounter: Secondary | ICD-10-CM

## 2012-09-13 DIAGNOSIS — Z8742 Personal history of other diseases of the female genital tract: Secondary | ICD-10-CM | POA: Insufficient documentation

## 2012-09-13 MED ORDER — OXYCODONE-ACETAMINOPHEN 5-325 MG PO TABS
2.0000 | ORAL_TABLET | Freq: Once | ORAL | Status: AC
  Administered 2012-09-13: 2 via ORAL
  Filled 2012-09-13: qty 2

## 2012-09-13 MED ORDER — OXYCODONE-ACETAMINOPHEN 5-325 MG PO TABS: 2.0000 | ORAL_TABLET | ORAL | Status: DC | PRN

## 2012-09-13 MED ORDER — IBUPROFEN 800 MG PO TABS: 800.0000 mg | ORAL_TABLET | Freq: Three times a day (TID) | ORAL | Status: DC

## 2012-09-13 NOTE — ED Notes (Signed)
Rt knee popped last  Night after standing up. It hurts  And it may be swelling

## 2012-09-13 NOTE — ED Provider Notes (Signed)
History     CSN: [id number]  Arrival date & time 09/13/12  1124   First MD Initiated Contact with Patient 09/13/12 1144      No chief complaint on file.   (Consider location/radiation/quality/duration/timing/severity/associated sxs/prior treatment) Patient is a 22 y.o. female presenting with knee pain. The history is provided by the patient. No language interpreter was used.  Knee Pain This is a new problem. The current episode started yesterday. The problem occurs constantly. The problem has been gradually worsening. Associated symptoms include joint swelling and myalgias. Nothing aggravates the symptoms. She has tried nothing for the symptoms. The treatment provided moderate relief.  Pt complains of popping and cracking when knee bends.  Pt reports knee feels like it is going to give a way  Past Medical History  Diagnosis Date  . Endometriosis   . Asthma   . PCOS (polycystic ovarian syndrome)     Past Surgical History  Procedure Date  . Mouth surgery     No family history on file.  History  Substance Use Topics  . Smoking status: Current Every Day Smoker -- 0.2 packs/day  . Smokeless tobacco: Not on file  . Alcohol Use: Yes    OB History    Grav Para Term Preterm Abortions TAB SAB Ect Mult Living                  Review of Systems  Unable to perform ROS Musculoskeletal: Positive for myalgias and joint swelling.  All other systems reviewed and are negative.    Allergies  Amoxicillin and Albuterol sulfate  Home Medications   Current Outpatient Rx  Name  Route  Sig  Dispense  Refill  . ALBUTEROL SULFATE HFA 108 (90 BASE) MCG/ACT IN AERS   Inhalation   Inhale 2 puffs into the lungs every 4 (four) hours as needed for wheezing or shortness of breath.   1 Inhaler   3   . CALCIUM CARBONATE ANTACID 500 MG PO CHEW   Oral   Chew 2 tablets by mouth daily as needed. For upset stomach         . ETONOGESTREL-ETHINYL ESTRADIOL 0.12-0.015 MG/24HR VA RING  Vaginal   Place 1 each vaginally every 28 (twenty-eight) days. Insert vaginally and leave in place for 3 consecutive weeks, then remove for 1 week.         Marland Kitchen FAMOTIDINE 10 MG PO TABS   Oral   Take 10 mg by mouth 2 (two) times daily as needed. For indigestion         . LORAZEPAM 2 MG PO TABS   Oral   Take 2 mg by mouth at bedtime as needed. For sleep           BP 134/97  Pulse 91  Temp 98.5 F (36.9 C) (Oral)  Resp 22  SpO2 99%  LMP 09/13/2012  Physical Exam  Nursing note and vitals reviewed. Constitutional: She appears well-developed and well-nourished.  HENT:  Head: Normocephalic.  Musculoskeletal: She exhibits tenderness.       From with pain,  nv and ns intact.  No medial or lateral instability    Neurological: She is alert.  Skin: Skin is warm.  Psychiatric: She has a normal mood and affect.    ED Course  Procedures (including critical care time)  Labs Reviewed - No data to display Dg Knee Complete 4 Views Left  09/13/2012  *RADIOLOGY REPORT*  Clinical Data: Pain after fall.  LEFT KNEE - COMPLETE 4+  VIEW  Comparison: None.  Findings: Bipartite patella.  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 No joint effusion.  IMPRESSION: No acute osseous abnormality.   Original Report Authenticated By: Jeronimo Greaves, M.D.      No diagnosis found.    MDM  Knee imbolizer,   Rx for percocet.   I advied follow up with Dr. Ophelia Charter for recheck.  Call for appointment        Elson Areas, PA 09/13/12 1234

## 2012-09-13 NOTE — Progress Notes (Signed)
Orthopedic Tech Progress Note Patient Details:  [name] 15-May-1991 [id number]  Ortho Devices Type of Ortho Device: Knee Immobilizer Ortho Device/Splint Location: left leg Ortho Device/Splint Interventions: Application   [name] 09/13/2012, 1:12 PM

## 2012-09-16 NOTE — ED Provider Notes (Signed)
Medical screening examination/treatment/procedure(s) were performed by non-physician practitioner and as supervising physician I was immediately available for consultation/collaboration.   Vash Quezada J. Lavinia Mcneely, MD 09/16/12 1542 

## 2012-10-29 ENCOUNTER — Encounter (HOSPITAL_COMMUNITY): Payer: Self-pay | Admitting: *Deleted

## 2012-10-29 ENCOUNTER — Emergency Department (HOSPITAL_COMMUNITY)
Admission: EM | Admit: 2012-10-29 | Discharge: 2012-10-29 | Disposition: A | Discharge status: Home / Self Care | Payer: Worker's Compensation | Attending: Emergency Medicine | Admitting: Emergency Medicine

## 2012-10-29 DIAGNOSIS — S61209A Unspecified open wound of unspecified finger without damage to nail, initial encounter: Secondary | ICD-10-CM | POA: Insufficient documentation

## 2012-10-29 DIAGNOSIS — F17200 Nicotine dependence, unspecified, uncomplicated: Secondary | ICD-10-CM | POA: Insufficient documentation

## 2012-10-29 DIAGNOSIS — Y990 Civilian activity done for income or pay: Secondary | ICD-10-CM | POA: Insufficient documentation

## 2012-10-29 DIAGNOSIS — Z23 Encounter for immunization: Secondary | ICD-10-CM | POA: Insufficient documentation

## 2012-10-29 DIAGNOSIS — Z79899 Other long term (current) drug therapy: Secondary | ICD-10-CM | POA: Insufficient documentation

## 2012-10-29 DIAGNOSIS — IMO0001 Reserved for inherently not codable concepts without codable children: Secondary | ICD-10-CM | POA: Insufficient documentation

## 2012-10-29 DIAGNOSIS — Y9269 Other specified industrial and construction area as the place of occurrence of the external cause: Secondary | ICD-10-CM | POA: Insufficient documentation

## 2012-10-29 DIAGNOSIS — Y93K9 Activity, other involving animal care: Secondary | ICD-10-CM | POA: Insufficient documentation

## 2012-10-29 DIAGNOSIS — S61258A Open bite of other finger without damage to nail, initial encounter: Secondary | ICD-10-CM

## 2012-10-29 DIAGNOSIS — Z8742 Personal history of other diseases of the female genital tract: Secondary | ICD-10-CM | POA: Insufficient documentation

## 2012-10-29 MED ORDER — TETANUS-DIPHTHERIA TOXOIDS TD 5-2 LFU IM INJ
0.5000 mL | INJECTION | Freq: Once | INTRAMUSCULAR | Status: AC
  Administered 2012-10-29: 0.5 mL via INTRAMUSCULAR
  Filled 2012-10-29: qty 0.5

## 2012-10-29 MED ORDER — AMOXICILLIN-POT CLAVULANATE 875-125 MG PO TABS
1.0000 | ORAL_TABLET | Freq: Once | ORAL | Status: AC
  Administered 2012-10-29: 1 via ORAL
  Filled 2012-10-29: qty 1

## 2012-10-29 MED ORDER — AMOXICILLIN-POT CLAVULANATE 875-125 MG PO TABS: 1.0000 | ORAL_TABLET | Freq: Two times a day (BID) | ORAL | Status: DC

## 2012-10-29 NOTE — ED Provider Notes (Signed)
History  This chart was scribed for non-physician practitioner working with Hilario Quarry, MD, by Candelaria Stagers, ED Scribe. This patient was seen in room WTR8/WTR8 and the patient's care was started at 10:52 PM   CSN: [id number]  Arrival date & time 10/29/12  2107   First MD Initiated Contact with Patient 10/29/12 2233      Chief Complaint  Patient presents with  . Animal Bite     The history is provided by the patient. No language interpreter was used.   [name] is a 22 y.o. female who presents to the Emergency Department complaining of an animal bite to her right index finger from a kitten that occurred earlier today while at work.  Pt states the cat is a ferrell cat with no known vaccines.  She reports the bite was from a small tooth, not an incisor.  Cat is in custody at vet and will be quarantined for the next ten days.  Animal control report has already been filed.  Pt has bled the puncture wound, scrubbed with soap and water, and dripped saline onto the bite.  Pt presents an injury report from work.  Last tetanus was 7 years ago.  Past Medical History  Diagnosis Date  . Endometriosis   . Asthma   . PCOS (polycystic ovarian syndrome)     Past Surgical History  Procedure Laterality Date  . Mouth surgery      No family history on file.  History  Substance Use Topics  . Smoking status: Current Every Day Smoker -- 0.25 packs/day  . Smokeless tobacco: Not on file  . Alcohol Use: Yes    OB History   Grav Para Term Preterm Abortions TAB SAB Ect Mult Living                  Review of Systems  Skin: Positive for wound (cat bite to right index finger).  All other systems reviewed and are negative.    Allergies  Amoxicillin and Albuterol sulfate  Home Medications   Current Outpatient Rx  Name  Route  Sig  Dispense  Refill  . albuterol (PROVENTIL HFA;VENTOLIN HFA) 108 (90 BASE) MCG/ACT inhaler   Inhalation   Inhale 2 puffs into the lungs every 4  (four) hours as needed for wheezing or shortness of breath.   1 Inhaler   3   . calcium carbonate (TUMS - DOSED IN MG ELEMENTAL CALCIUM) 500 MG chewable tablet   Oral   Chew 2 tablets by mouth daily as needed. For upset stomach         . etonogestrel-ethinyl estradiol (NUVARING) 0.12-0.015 MG/24HR vaginal ring   Vaginal   Place 1 each vaginally every 28 (twenty-eight) days. Insert vaginally and leave in place for 3 consecutive weeks, then remove for 1 week.         Marland Kitchen LORazepam (ATIVAN) 2 MG tablet   Oral   Take 2 mg by mouth at bedtime as needed. For sleep         . amoxicillin-clavulanate (AUGMENTIN) 875-125 MG per tablet   Oral   Take 1 tablet by mouth 2 (two) times daily.   14 tablet   0     BP 146/99  Pulse 84  Temp(Src) 97.8 F (36.6 C) (Oral)  Resp 18  Ht 5\' 8"  (1.727 m)  SpO2 100%  LMP 10/22/2012  Physical Exam  Nursing note and vitals reviewed. Constitutional: She is oriented to person, place, and time. She  appears well-developed and well-nourished. No distress.  HENT:  Head: Normocephalic and atraumatic.  Eyes: EOM are normal.  Neck: Neck supple. No tracheal deviation present.  Cardiovascular: Normal rate.   Pulmonary/Chest: Effort normal. No respiratory distress.  Musculoskeletal: Normal range of motion.  Small puncture wound to the right index finger which has been bled and irrigated.  No tenderness.  No erythema or drainage from wound.    Neurological: She is alert and oriented to person, place, and time.  Skin: Skin is warm and dry.  Psychiatric: She has a normal mood and affect. Her behavior is normal.    ED Course  Procedures   DIAGNOSTIC STUDIES: Oxygen Saturation is 100% on room air, normal by my interpretation.    COORDINATION OF CARE:  10:55 PM Will prescribe Augmentin.  Pt understands and agrees.    Labs Reviewed - No data to display No results found.   1. Cat bite of index finger, initial encounter       MDM  Pt finger  bled at [facility]. Wound is clean. Washed with soap and water. Pt familiar with signs and symptoms of infection. Given high dose Augmenting for 7 days TID. Asked to return for wound check in 3 days. Animal in quarantine.   Pt has been advised of the symptoms that warrant their return to the ED. Patient has voiced understanding and has agreed to follow-up with the PCP or specialist.       Dorthula Matas, PA-C 10/29/12 2349

## 2012-10-29 NOTE — ED Notes (Signed)
Pt bit right index finger from a cat at [employer] where she works; vaccinations unknown on cat; states animal control report has already been filed; cat is in custody at [employer]

## 2012-10-30 NOTE — ED Provider Notes (Signed)
History/physical exam/procedure(s) were performed by non-physician practitioner and as supervising physician I was immediately available for consultation/collaboration. I have reviewed all notes and am in agreement with care and plan. I personally performed the services described in this documentation, which was scribed in my presence. The recorded information has been reviewed and considered.   Hilario Quarry, MD 10/30/12 [phone]

## 2013-05-12 ENCOUNTER — Inpatient Hospital Stay (HOSPITAL_COMMUNITY)
Admission: AD | Admit: 2013-05-12 | Discharge: 2013-05-12 | Disposition: A | Discharge status: Home / Self Care | Payer: Medicaid Other | Source: Ambulatory Visit | Attending: Obstetrics and Gynecology | Admitting: Obstetrics and Gynecology

## 2013-05-12 ENCOUNTER — Encounter (HOSPITAL_COMMUNITY): Payer: Self-pay

## 2013-05-12 DIAGNOSIS — O99891 Other specified diseases and conditions complicating pregnancy: Secondary | ICD-10-CM | POA: Insufficient documentation

## 2013-05-12 DIAGNOSIS — G5602 Carpal tunnel syndrome, left upper limb: Secondary | ICD-10-CM

## 2013-05-12 DIAGNOSIS — G5600 Carpal tunnel syndrome, unspecified upper limb: Secondary | ICD-10-CM | POA: Insufficient documentation

## 2013-05-12 DIAGNOSIS — R42 Dizziness and giddiness: Secondary | ICD-10-CM | POA: Insufficient documentation

## 2013-05-12 DIAGNOSIS — I959 Hypotension, unspecified: Secondary | ICD-10-CM | POA: Insufficient documentation

## 2013-05-12 HISTORY — DX: Open bite of unspecified finger without damage to nail, initial encounter: S61.259A

## 2013-05-12 LAB — URINALYSIS, ROUTINE W REFLEX MICROSCOPIC
Leukocytes, UA: NEGATIVE
Nitrite: NEGATIVE
Specific Gravity, Urine: >1.03 — ABNORMAL HIGH (ref 1.005–1.030)
pH: 5.5 (ref 5.0–8.0)

## 2013-05-12 LAB — POCT PREGNANCY, URINE: Preg Test, Ur: POSITIVE — AB

## 2013-05-12 MED ORDER — FAMOTIDINE 20 MG PO TABS: 20.0000 mg | ORAL_TABLET | Freq: Two times a day (BID) | ORAL | Status: DC

## 2013-05-12 MED ORDER — FAMOTIDINE 20 MG PO TABS
20.0000 mg | ORAL_TABLET | Freq: Once | ORAL | Status: AC
  Administered 2013-05-12: 20 mg via ORAL
  Filled 2013-05-12: qty 1

## 2013-05-12 NOTE — MAU Note (Signed)
[facility] She went and started feeling numbness and tingling her hands. She felt like her hands are swollen, and can't get her rings off. She also states that she feels dizzy whenever she gets up or changed position. She denies vaginal bleeding, abdominal pain or abnormal discharge. She states that she is unsure of her lmp but thinks it was in July. 7/24

## 2013-05-12 NOTE — MAU Provider Note (Signed)
History     CSN: [id number]  Arrival date and time: 05/12/13 2440   First Provider Initiated Contact with Patient 05/12/13 1944      Chief Complaint  Patient presents with  . Numbness  . Dizziness   HPI [name] is a 22 y.o. G1P0 at [redacted]w[redacted]d who presents to MAU today with complaints of dizziness and tingling in her hands. The tingling is worse on the right side. She is also having swelling in both hands. She states that dizziness occurs with changes of position. LMP was at the end of July. She has also had regular heartburn. She hasn't taken any medication for it because she didn't know what to take. She has had some nausea without vomiting.    OB History   Grav Para Term Preterm Abortions TAB SAB Ect Mult Living   1               Past Medical History  Diagnosis Date  . Endometriosis   . Asthma   . PCOS (polycystic ovarian syndrome)     Past Surgical History  Procedure Laterality Date  . Mouth surgery      No family history on file.  History  Substance Use Topics  . Smoking status: Current Every Day Smoker -- 0.25 packs/day  . Smokeless tobacco: Not on file  . Alcohol Use: Yes    Allergies:  Allergies  Allergen Reactions  . Amoxicillin     Needs probiotic. Causes yeast infections  . Albuterol Sulfate Palpitations    Prescriptions prior to admission  Medication Sig Dispense Refill  . albuterol (PROVENTIL HFA;VENTOLIN HFA) 108 (90 BASE) MCG/ACT inhaler Inhale 2 puffs into the lungs every 4 (four) hours as needed for wheezing or shortness of breath.  1 Inhaler  3  . amoxicillin-clavulanate (AUGMENTIN) 875-125 MG per tablet Take 1 tablet by mouth 2 (two) times daily.  14 tablet  0  . calcium carbonate (TUMS - DOSED IN MG ELEMENTAL CALCIUM) 500 MG chewable tablet Chew 2 tablets by mouth daily as needed. For upset stomach      . etonogestrel-ethinyl estradiol (NUVARING) 0.12-0.015 MG/24HR vaginal ring Place 1 each vaginally every 28 (twenty-eight) days.  Insert vaginally and leave in place for 3 consecutive weeks, then remove for 1 week.      Marland Kitchen LORazepam (ATIVAN) 2 MG tablet Take 2 mg by mouth at bedtime as needed. For sleep        Review of Systems  Constitutional: Negative for fever and malaise/fatigue.  Gastrointestinal: Positive for nausea. Negative for vomiting, abdominal pain, diarrhea and constipation.  Genitourinary: Negative for dysuria, urgency and frequency.       Neg - vaginal bleeding, discharge  Neurological: Positive for dizziness and tingling. Negative for loss of consciousness.   Physical Exam   Blood pressure 119/76, pulse 84, temperature 98.2 F (36.8 C), resp. rate 18, height 5\' 8"  (1.727 m), weight 332 lb 8 oz (150.821 kg), last menstrual period 03/13/2013, SpO2 100.00%.  Physical Exam  Constitutional: She is oriented to person, place, and time. She appears well-developed and well-nourished. No distress.  HENT:  Head: Normocephalic and atraumatic.  Cardiovascular: Normal rate, regular rhythm and normal heart sounds.   Respiratory: Effort normal and breath sounds normal. No respiratory distress.  GI: Soft. Bowel sounds are normal. She exhibits no distension and no mass. There is tenderness (mild upper abdominal tenderness to palpation). There is no rebound and no guarding.  Neurological: She is alert and oriented to  person, place, and time.  + Tinnels sign + Phalens sign  Skin: Skin is warm and dry.  Psychiatric: She has a normal mood and affect.   Results for orders placed during the hospital encounter of 05/12/13 (from the past 24 hour(s))  POCT PREGNANCY, URINE     Status: Abnormal   Collection Time    05/12/13  7:29 PM      Result Value Range   Preg Test, Ur POSITIVE (*) NEGATIVE  URINALYSIS, ROUTINE W REFLEX MICROSCOPIC     Status: Abnormal   Collection Time    05/12/13  7:30 PM      Result Value Range   Color, Urine YELLOW  YELLOW   APPearance CLEAR  CLEAR   Specific Gravity, Urine >1.030 (*) 1.005 -  1.030   pH 5.5  5.0 - 8.0   Glucose, UA NEGATIVE  NEGATIVE mg/dL   Hgb urine dipstick NEGATIVE  NEGATIVE   Bilirubin Urine NEGATIVE  NEGATIVE   Ketones, ur NEGATIVE  NEGATIVE mg/dL   Protein, ur NEGATIVE  NEGATIVE mg/dL   Urobilinogen, UA 0.2  0.0 - 1.0 mg/dL   Nitrite NEGATIVE  NEGATIVE   Leukocytes, UA NEGATIVE  NEGATIVE    MAU Course  Procedures None  MDM +UPT UA today   Assessment and Plan  A: Carpal tunnel syndrome Hypotension  P: Discharge home Referred to [facility] clinic for prenatal care in [facility] Advised patient to increase PO hydration as tolerated ABCs of pregnancy information given Patient encouraged to get wrist braces for support of carpal tunnel Patient may return to MAU as needed  Freddi Starr, PA-C  05/12/2013, 7:44 PM

## 2013-05-14 NOTE — MAU Provider Note (Signed)
Attestation of Attending Supervision of Advanced Practitioner (CNM/NP): Evaluation and management procedures were performed by the Advanced Practitioner under my supervision and collaboration.  I have reviewed the Advanced Practitioner's note and chart, and I agree with the management and plan.  Ursala Cressy 05/14/2013 10:26 AM

## 2013-06-04 ENCOUNTER — Emergency Department (HOSPITAL_COMMUNITY)
Admission: EM | Admit: 2013-06-04 | Discharge: 2013-06-05 | Disposition: A | Discharge status: Home / Self Care | Payer: Medicaid Other | Attending: Emergency Medicine | Admitting: Emergency Medicine

## 2013-06-04 ENCOUNTER — Encounter (HOSPITAL_COMMUNITY): Payer: Self-pay | Admitting: Emergency Medicine

## 2013-06-04 ENCOUNTER — Telehealth: Payer: Self-pay

## 2013-06-04 ENCOUNTER — Emergency Department (HOSPITAL_COMMUNITY): Payer: Medicaid Other

## 2013-06-04 DIAGNOSIS — Z8639 Personal history of other endocrine, nutritional and metabolic disease: Secondary | ICD-10-CM | POA: Insufficient documentation

## 2013-06-04 DIAGNOSIS — R109 Unspecified abdominal pain: Secondary | ICD-10-CM | POA: Insufficient documentation

## 2013-06-04 DIAGNOSIS — Z87828 Personal history of other (healed) physical injury and trauma: Secondary | ICD-10-CM | POA: Insufficient documentation

## 2013-06-04 DIAGNOSIS — O210 Mild hyperemesis gravidarum: Secondary | ICD-10-CM | POA: Insufficient documentation

## 2013-06-04 DIAGNOSIS — Z79899 Other long term (current) drug therapy: Secondary | ICD-10-CM | POA: Insufficient documentation

## 2013-06-04 DIAGNOSIS — O99330 Smoking (tobacco) complicating pregnancy, unspecified trimester: Secondary | ICD-10-CM | POA: Insufficient documentation

## 2013-06-04 DIAGNOSIS — Z8742 Personal history of other diseases of the female genital tract: Secondary | ICD-10-CM | POA: Insufficient documentation

## 2013-06-04 DIAGNOSIS — Z862 Personal history of diseases of the blood and blood-forming organs and certain disorders involving the immune mechanism: Secondary | ICD-10-CM | POA: Insufficient documentation

## 2013-06-04 DIAGNOSIS — J069 Acute upper respiratory infection, unspecified: Secondary | ICD-10-CM

## 2013-06-04 DIAGNOSIS — Z3490 Encounter for supervision of normal pregnancy, unspecified, unspecified trimester: Secondary | ICD-10-CM

## 2013-06-04 DIAGNOSIS — J45909 Unspecified asthma, uncomplicated: Secondary | ICD-10-CM | POA: Insufficient documentation

## 2013-06-04 LAB — CBC WITH DIFFERENTIAL/PLATELET
Basophils Absolute: 0 10*3/uL (ref 0.0–0.1)
Eosinophils Relative: 3 % (ref 0–5)
HCT: 38.6 % (ref 36.0–46.0)
Hemoglobin: 13.8 g/dL (ref 12.0–15.0)
Lymphocytes Relative: 33 % (ref 12–46)
Lymphs Abs: 2.4 10*3/uL (ref 0.7–4.0)
MCV: 85.4 fL (ref 78.0–100.0)
Monocytes Absolute: 0.7 10*3/uL (ref 0.1–1.0)
Monocytes Relative: 9 % (ref 3–12)
Neutro Abs: 4 10*3/uL (ref 1.7–7.7)
Neutrophils Relative %: 55 % (ref 43–77)
RBC: 4.52 MIL/uL (ref 3.87–5.11)
RDW: 12 % (ref 11.5–15.5)
WBC: 7.3 10*3/uL (ref 4.0–10.5)

## 2013-06-04 LAB — OB RESULTS CONSOLE GC/CHLAMYDIA
CHLAMYDIA, DNA PROBE: NEGATIVE
GC PROBE AMP, GENITAL: NEGATIVE

## 2013-06-04 LAB — HCG, QUANTITATIVE, PREGNANCY: hCG, Beta Chain, Quant, S: 67736 m[IU]/mL — ABNORMAL HIGH (ref <5)

## 2013-06-04 LAB — POCT PREGNANCY, URINE: Preg Test, Ur: POSITIVE — AB

## 2013-06-04 LAB — BASIC METABOLIC PANEL
CO2: 24 mEq/L (ref 19–32)
Chloride: 101 mEq/L (ref 96–112)
GFR calc non Af Amer: >90 mL/min (ref >90)
Glucose, Bld: 100 mg/dL — ABNORMAL HIGH (ref 70–99)
Potassium: 3.7 mEq/L (ref 3.5–5.1)
Sodium: 133 mEq/L — ABNORMAL LOW (ref 135–145)

## 2013-06-04 LAB — TYPE AND SCREEN
ABO/RH(D): O NEG
Antibody Screen: NEGATIVE

## 2013-06-04 LAB — WET PREP, GENITAL: Yeast Wet Prep HPF POC: NONE SEEN

## 2013-06-04 LAB — ABO/RH: ABO/RH(D): O NEG

## 2013-06-04 MED ORDER — SODIUM CHLORIDE 0.9 % IV BOLUS (SEPSIS)
1000.0000 mL | Freq: Once | INTRAVENOUS | Status: AC
  Administered 2013-06-04: 1000 mL via INTRAVENOUS

## 2013-06-04 NOTE — ED Notes (Signed)
Patient also c/o post tussive emesis and dizziness

## 2013-06-04 NOTE — ED Notes (Signed)
Patient with c/o "stopped up nose that I can't clear up. Then I started coughing and that freaked me out because I'm [redacted] weeks pregnant." Patient also c/o "not being able to keep anything down, even toast and clear liquids." Patient denies vaginal bleeding, but states that she does have vaginal discharge, but "that's considered normal according to a book I'm reading."

## 2013-06-04 NOTE — Telephone Encounter (Signed)
Pt called and stated that she would have a new ob appt scheduled on Oct 22nd and had a question. Called pt and pt asked if she was going to receive an [place] that day so she can tell her husband so that he would take off.  I informed pt that no that an [place] would not be done that day but that one would possibly be scheduled for another day.  Pt stated "ok , thank you."

## 2013-06-04 NOTE — ED Provider Notes (Signed)
CSN: [id number]     Arrival date & time 06/04/13  2043 History   First MD Initiated Contact with Patient 06/04/13 2115     Chief Complaint  Patient presents with  . Abdominal Pain  . Cough  . Emesis    Post tussive emesis   (Consider location/radiation/quality/duration/timing/severity/associated sxs/prior Treatment) HPI  This is a 22 are old G1 P0 female who presents with upper respiratory symptoms, nausea, and abdominal pain. Patient reports 3 days of nasal congestion and nonproductive cough. She denies any fevers. She denies any sore throat or ear pain. Patient states she's also had multiple episodes of nonbilious, nonbloody emesis including one in triage. She states "it could be morning sickness."  She reports sharp epigastric pains that started today. She reports her pain is 6/10. It is worse with coughing. +vaginal discharge.  She denies any chest pain or shortness of breath. She has not gotten her flu shot this year.  Past Medical History  Diagnosis Date  . Endometriosis   . Asthma   . PCOS (polycystic ovarian syndrome)   . Animal bite of finger     a cat   Past Surgical History  Procedure Laterality Date  . Mouth surgery     History reviewed. No pertinent family history. History  Substance Use Topics  . Smoking status: Current Every Day Smoker -- 0.25 packs/day  . Smokeless tobacco: Not on file  . Alcohol Use: Yes   OB History   Grav Para Term Preterm Abortions TAB SAB Ect Mult Living   1              Review of Systems  Constitutional: Negative for fever.  Respiratory: Positive for cough. Negative for chest tightness and shortness of breath.   Cardiovascular: Negative for chest pain.  Gastrointestinal: Positive for nausea and vomiting. Negative for abdominal pain.  Genitourinary: Positive for vaginal discharge. Negative for dysuria, vaginal bleeding and vaginal pain.  Musculoskeletal: Negative for back pain.  Neurological: Negative for headaches.  All other  systems reviewed and are negative.    Allergies  Amoxicillin and Albuterol sulfate  Home Medications   Current Outpatient Rx  Name  Route  Sig  Dispense  Refill  . albuterol (PROVENTIL HFA;VENTOLIN HFA) 108 (90 BASE) MCG/ACT inhaler   Inhalation   Inhale 2 puffs into the lungs every 4 (four) hours as needed for wheezing or shortness of breath.   1 Inhaler   3   . calcium carbonate (TUMS - DOSED IN MG ELEMENTAL CALCIUM) 500 MG chewable tablet   Oral   Chew 2 tablets by mouth 3 (three) times daily as needed for heartburn. For upset stomach         . famotidine (PEPCID) 10 MG tablet   Oral   Take 10 mg by mouth 2 (two) times daily.         Marland Kitchen guaifenesin (HUMIBID E) 400 MG TABS tablet   Oral   Take 400 mg by mouth every 4 (four) hours.         . Prenatal Vit-Fe Fumarate-FA (PRENATAL MULTIVITAMIN) TABS tablet   Oral   Take 1 tablet by mouth daily at 12 noon.         . sodium chloride (OCEAN) 0.65 % nasal spray   Nasal   Place 1 spray into the nose as needed for congestion.         Marland Kitchen albuterol (PROVENTIL HFA;VENTOLIN HFA) 108 (90 BASE) MCG/ACT inhaler   Inhalation   Inhale  2 puffs into the lungs every 4 (four) hours as needed (cough).   1 Inhaler   0    BP 106/64  Pulse 83  Temp(Src) 98.6 F (37 C) (Oral)  Resp 22  Ht 5\' 7"  (1.702 m)  Wt 300 lb (136.079 kg)  BMI 46.98 kg/m2  SpO2 100%  LMP 03/13/2013 Physical Exam  Nursing note and vitals reviewed. Constitutional: She is oriented to person, place, and time. She appears well-developed and well-nourished.  obese  HENT:  Head: Normocephalic and atraumatic.  Cardiovascular: Normal rate, regular rhythm and normal heart sounds.   No murmur heard. Pulmonary/Chest: Effort normal and breath sounds normal. No respiratory distress. She has no wheezes.  Abdominal: Soft. Bowel sounds are normal. There is no tenderness.  Genitourinary:  Scant white vaginal discharge noted on pelvic exam, no lateralizing  tenderness, no cervical motion tenderness  Musculoskeletal: She exhibits no edema.  Neurological: She is alert and oriented to person, place, and time.  Skin: Skin is warm and dry.  Psychiatric: She has a normal mood and affect.    ED Course  Procedures (including critical care time) Labs Review Labs Reviewed  WET PREP, GENITAL - Abnormal; Notable for the following:    WBC, Wet Prep HPF POC FEW (*)    All other components within normal limits  BASIC METABOLIC PANEL - Abnormal; Notable for the following:    Sodium 133 (*)    Glucose, Bld 100 (*)    All other components within normal limits  HCG, QUANTITATIVE, PREGNANCY - Abnormal; Notable for the following:    hCG, Beta Chain, Quant, [place] [address] (*)    All other components within normal limits  POCT PREGNANCY, URINE - Abnormal; Notable for the following:    Preg Test, Ur POSITIVE (*)    All other components within normal limits  GC/CHLAMYDIA PROBE AMP  CBC WITH DIFFERENTIAL  TYPE AND SCREEN  ABO/RH   Imaging Review US Ob Comp Less 14 Wks  06/05/2013   CLINICAL DATA:  Pelvic pain.  Pregnancy.  EXAM: OBSTETRIC <14 WK ULTRASOUND  TECHNIQUE: Transabdominal ultrasound was performed for evaluation of the gestation as well as the maternal uterus and adnexal regions.  COMPARISON:  None of the same gestation  FINDINGS: Intrauterine gestational sac: Visualized/normal in shape. No subchorionic hemorrhage.  Yolk sac:  Present, 4 mm diameter.  Embryo:  Present.  Cardiac Activity: Present and normal.  Heart Rate: 165 bpm  CRL:   1.9 cm   8 w 3 d                  [place] EDC: 01/11/2014  Maternal uterus/adnexae: Other than the gestational, unremarkable uterus. Normal and symmetric appearing ovaries, 3.5 x 2.3 x 2.8 cm on the right and 3.3 x 2.2 x 2.6 cm on the left.  IMPRESSION: Single living intrauterine gestation, estimated gestational age [redacted] weeks 3 days.   Electronically Signed   By: Tiburcio Pea M.D.   On: 06/05/2013 00:58    EKG Interpretation    None       MDM   1. Upper respiratory infection   2. Pregnancy    Presents with cough and abdominal pain. She states that she is approximately [redacted] weeks pregnant by last menstrual period.  She has an OB appointment later this week. Belly is soft without rebound or guarding. GU exam is reassuring. Patient is coughing but breath sounds are clear. She is afebrile. Ultrasound shows intrauterine pregnancy at 8 weeks and 3  days. Discussed with the patient I have low suspicion for pneumonia and that she likely has a viral URI. Patient was given albuterol given her history reactive airway disease and persistent cough. Patient will followup as scheduled with her OB/GYN.  After history, exam, and medical workup I feel the patient has been appropriately medically screened and is safe for discharge home. Pertinent diagnoses were discussed with the patient. Patient was given return precautions.     Shon Baton, MD 06/06/13 [phone]

## 2013-06-05 LAB — GC/CHLAMYDIA PROBE AMP: CT Probe RNA: NEGATIVE

## 2013-06-05 MED ORDER — ALBUTEROL SULFATE HFA 108 (90 BASE) MCG/ACT IN AERS: 2.0000 | INHALATION_SPRAY | RESPIRATORY_TRACT | Status: DC | PRN

## 2013-06-05 MED ORDER — ALBUTEROL SULFATE HFA 108 (90 BASE) MCG/ACT IN AERS
2.0000 | INHALATION_SPRAY | RESPIRATORY_TRACT | Status: DC | PRN
  Administered 2013-06-05: 2 via RESPIRATORY_TRACT
  Filled 2013-06-05: qty 6.7

## 2013-06-11 ENCOUNTER — Other Ambulatory Visit: Payer: Self-pay | Admitting: Obstetrics and Gynecology

## 2013-06-11 ENCOUNTER — Ambulatory Visit (INDEPENDENT_AMBULATORY_CARE_PROVIDER_SITE_OTHER): Payer: Medicaid Other | Admitting: Obstetrics and Gynecology

## 2013-06-11 ENCOUNTER — Encounter: Payer: Self-pay | Admitting: Obstetrics and Gynecology

## 2013-06-11 ENCOUNTER — Encounter (HOSPITAL_COMMUNITY): Payer: Self-pay

## 2013-06-11 ENCOUNTER — Ambulatory Visit (HOSPITAL_COMMUNITY)
Admission: RE | Admit: 2013-06-11 | Discharge: 2013-06-11 | Disposition: A | Discharge status: Home / Self Care | Payer: Medicaid Other | Source: Ambulatory Visit | Attending: Obstetrics and Gynecology | Admitting: Obstetrics and Gynecology

## 2013-06-11 ENCOUNTER — Ambulatory Visit (HOSPITAL_COMMUNITY): Payer: Medicaid Other

## 2013-06-11 VITALS — BP 132/81 | Temp 98.8°F | Wt 322.0 lb

## 2013-06-11 DIAGNOSIS — Z124 Encounter for screening for malignant neoplasm of cervix: Secondary | ICD-10-CM

## 2013-06-11 DIAGNOSIS — F17200 Nicotine dependence, unspecified, uncomplicated: Secondary | ICD-10-CM | POA: Insufficient documentation

## 2013-06-11 DIAGNOSIS — O360990 Maternal care for other rhesus isoimmunization, unspecified trimester, not applicable or unspecified: Secondary | ICD-10-CM

## 2013-06-11 DIAGNOSIS — Z3682 Encounter for antenatal screening for nuchal translucency: Secondary | ICD-10-CM

## 2013-06-11 DIAGNOSIS — O360113 Maternal care for anti-D [Rh] antibodies, first trimester, fetus 3: Secondary | ICD-10-CM

## 2013-06-11 DIAGNOSIS — E669 Obesity, unspecified: Secondary | ICD-10-CM | POA: Insufficient documentation

## 2013-06-11 DIAGNOSIS — Z3689 Encounter for other specified antenatal screening: Secondary | ICD-10-CM | POA: Insufficient documentation

## 2013-06-11 DIAGNOSIS — Z3491 Encounter for supervision of normal pregnancy, unspecified, first trimester: Secondary | ICD-10-CM | POA: Insufficient documentation

## 2013-06-11 DIAGNOSIS — Z113 Encounter for screening for infections with a predominantly sexual mode of transmission: Secondary | ICD-10-CM

## 2013-06-11 LAB — US OB LIMITED

## 2013-06-11 LAB — POCT URINALYSIS DIP (DEVICE)
Glucose, UA: NEGATIVE mg/dL
Hgb urine dipstick: NEGATIVE
Ketones, ur: NEGATIVE mg/dL
Specific Gravity, Urine: >=1.03 (ref 1.005–1.030)
Urobilinogen, UA: 0.2 mg/dL (ref 0.0–1.0)

## 2013-06-11 NOTE — Progress Notes (Signed)
   Subjective:    [name] is a G1P0 [redacted]w[redacted]d being seen today for her first obstetrical visit.  Her obstetrical history is significant for obesity. Patient does intend to breast feed. Pregnancy history fully reviewed.  Patient reports no complaints. 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  Filed Vitals:   06/11/13 0931  BP: 132/81  Temp: 98.8 F (37.1 C)  Weight: 146.058 kg (322 lb)    HISTORY: OB History  Gravida Para Term Preterm AB SAB TAB Ectopic Multiple Living  1             # Outcome Date GA Lbr Len/2nd Weight Sex Delivery Anes PTL Lv  1 CUR              Past Medical History  Diagnosis Date  . Endometriosis   . Asthma   . PCOS (polycystic ovarian syndrome)   . Animal bite of finger     a cat  . Bronchitis    Past Surgical History  Procedure Laterality Date  . Mouth surgery     Family History  Problem Relation Age of Onset  . Asthma Mother   . Diabetes Father   . Heart attack Maternal Grandfather   . Diabetes Paternal Grandmother   . Heart attack Paternal Grandfather      Exam    Uterus:     Pelvic Exam:    Perineum: No Hemorrhoids, Normal Perineum   Vulva: normal, female escutcheon   Vagina:  normal mucosa, normal discharge       Cervix: no bleeding following Pap and no cervical motion tenderness   Adnexa: no mass, fullness, tenderness   Bony Pelvis: gynecoid  System: Breast:  normal appearance, no masses or tenderness   Skin: normal coloration and turgor, no rashes    Neurologic: oriented, normal, grossly non-focal   Extremities: normal strength, tone, and muscle mass   HEENT PERRLA and extra ocular movement intact   Mouth/Teeth mucous membranes moist, pharynx normal without lesions and dental hygiene good   Neck supple and no masses   Cardiovascular: regular rate and rhythm, no murmurs or gallops   Respiratory:  appears well, vitals normal, no respiratory distress, acyanotic, normal RR, ear and  throat exam is normal, neck free of mass or lymphadenopathy, chest clear, no wheezing, crepitations, rhonchi, normal symmetric air entry   Abdomen: Obese DT: not heard> [place] by Diane confirms viable early IUP   Urinary: urethral meatus normal      Assessment:    Pregnancy: G1P0 Patient Active Problem List   Diagnosis Date Noted  . Supervision of normal pregnancy in first trimester 06/11/2013  . Obesity 06/11/2013  Smoker      Plan:     Initial labs drawn. Prenatal vitamins. Problem list reviewed and updated. Genetic Screening discussed First Screen, Integrated Screen and Quad Screen: undecided. Call if decides on integrated screen.  Ultrasound discussed; fetal survey: requested.  Follow up in 4 weeks. 50% of 30 min visit spent on counseling and coordination of care.  Pregnancy precautions reviewed. Smoking cessation info given.   POE,DEIRDRE 06/11/2013

## 2013-06-11 NOTE — Addendum Note (Signed)
Addended by: Faythe Casa on: 06/11/2013 04:41 PM   Modules accepted: Orders

## 2013-06-11 NOTE — Patient Instructions (Addendum)
Pregnancy - First Trimester During sexual intercourse, millions of sperm go into the vagina. Only 1 sperm will penetrate and fertilize the female egg while it is in the Fallopian tube. One week later, the fertilized egg implants into the wall of the uterus. An embryo begins to develop into a baby. At 6 to 8 weeks, the eyes and face are formed and the heartbeat can be seen on ultrasound. At the end of 12 weeks (first trimester), all the baby's organs are formed. Now that you are pregnant, you will want to do everything you can to have a healthy baby. Two of the most important things are to get good prenatal care and follow your caregiver's instructions. Prenatal care is all the medical care you receive before the baby's birth. It is given to prevent, find, and treat problems during the pregnancy and childbirth. PRENATAL EXAMS  During prenatal visits, your weight, blood pressure, and urine are checked. This is done to make sure you are healthy and progressing normally during the pregnancy.  A pregnant woman should gain 25 to 35 pounds during the pregnancy. However, if you are overweight or underweight, your caregiver will advise you regarding your weight.  Your caregiver will ask and answer questions for you.  Blood work, cervical cultures, other necessary tests, and a Pap test are done during your prenatal exams. These tests are done to check on your health and the probable health of your baby. Tests are strongly recommended and done for HIV with your permission. This is the virus that causes AIDS. These tests are done because medicines can be given to help prevent your baby from being born with this infection should you have been infected without knowing it. Blood work is also used to find out your blood type, previous infections, and follow your blood levels (hemoglobin).  Low hemoglobin (anemia) is common during pregnancy. Iron and vitamins are given to help prevent this. Later in the pregnancy, blood  tests for diabetes will be done along with any other tests if any problems develop.  You may need other tests to make sure you and the baby are doing well. CHANGES DURING THE FIRST TRIMESTER  Your body goes through many changes during pregnancy. They vary from person to person. Talk to your caregiver about changes you notice and are concerned about. Changes can include:  Your menstrual period stops.  The egg and sperm carry the genes that determine what you look like. Genes from you and your partner are forming a baby. The female genes determine whether the baby is a boy or a girl.  Your body increases in girth and you may feel bloated.  Feeling sick to your stomach (nauseous) and throwing up (vomiting). If the vomiting is uncontrollable, call your caregiver.  Your breasts will begin to enlarge and become tender.  Your nipples may stick out more and become darker.  The need to urinate more. Painful urination may mean you have a bladder infection.  Tiring easily.  Loss of appetite.  Cravings for certain kinds of food.  At first, you may gain or lose a couple of pounds.  You may have changes in your emotions from day to day (excited to be pregnant or concerned something may go wrong with the pregnancy and baby).  You may have more vivid and strange dreams. HOME CARE INSTRUCTIONS   It is very important to avoid all smoking, alcohol and non-prescribed drugs during your pregnancy. These affect the formation and growth of the baby.   Avoid chemicals while pregnant to ensure the delivery of a healthy infant.  Start your prenatal visits by the 12th week of pregnancy. They are usually scheduled monthly at first, then more often in the last 2 months before delivery. Keep your caregiver's appointments. Follow your caregiver's instructions regarding medicine use, blood and lab tests, exercise, and diet.  During pregnancy, you are providing food for you and your baby. Eat regular, well-balanced  meals. Choose foods such as meat, fish, milk and other low fat dairy products, vegetables, fruits, and whole-grain breads and cereals. Your caregiver will tell you of the ideal weight gain.  You can help morning sickness by keeping soda crackers at the bedside. Eat a couple before arising in the morning. You may want to use the crackers without salt on them.  Eating 4 to 5 small meals rather than 3 large meals a day also may help the nausea and vomiting.  Drinking liquids between meals instead of during meals also seems to help nausea and vomiting.  A physical sexual relationship may be continued throughout pregnancy if there are no other problems. Problems may be early (premature) leaking of amniotic fluid from the membranes, vaginal bleeding, or belly (abdominal) pain.  Exercise regularly if there are no restrictions. Check with your caregiver or physical therapist if you are unsure of the safety of some of your exercises. Greater weight gain will occur in the last 2 trimesters of pregnancy. Exercising will help:  Control your weight.  Keep you in shape.  Prepare you for labor and delivery.  Help you lose your pregnancy weight after you deliver your baby.  Wear a good support or jogging bra for breast tenderness during pregnancy. This may help if worn during sleep too.  Ask when prenatal classes are available. Begin classes when they are offered.  Do not use hot tubs, steam rooms, or saunas.  Wear your seat belt when driving. This protects you and your baby if you are in an accident.  Avoid raw meat, uncooked cheese, cat litter boxes, and soil used by cats throughout the pregnancy. These carry germs that can cause birth defects in the baby.  The first trimester is a good time to visit your dentist for your dental health. Getting your teeth cleaned is okay. Use a softer toothbrush and brush gently during pregnancy.  Ask for help if you have financial, counseling, or nutritional needs  during pregnancy. Your caregiver will be able to offer counseling for these needs as well as refer you for other special needs.  Do not take any medicines or herbs unless told by your caregiver.  Inform your caregiver if there is any mental or physical domestic violence.  Make a list of emergency phone numbers of family, friends, hospital, and police and fire departments.  Write down your questions. Take them to your prenatal visit.  Do not douche.  Do not cross your legs.  If you have to stand for long periods of time, rotate you feet or take small steps in a circle.  You may have more vaginal secretions that may require a sanitary pad. Do not use tampons or scented sanitary pads. MEDICINES AND DRUG USE IN PREGNANCY  Take prenatal vitamins as directed. The vitamin should contain 1 milligram of folic acid. Keep all vitamins out of reach of children. Only a couple vitamins or tablets containing iron may be fatal to a baby or young child when ingested.  Avoid use of all medicines, including herbs, over-the-counter medicines, not   prescribed or suggested by your caregiver. Only take over-the-counter or prescription medicines for pain, discomfort, or fever as directed by your caregiver. Do not use aspirin, ibuprofen, or naproxen unless directed by your caregiver.  Let your caregiver also know about herbs you may be using.  Alcohol is related to a number of birth defects. This includes fetal alcohol syndrome. All alcohol, in any form, should be avoided completely. Smoking will cause low birth rate and premature babies.  Street or illegal drugs are very harmful to the baby. They are absolutely forbidden. A baby born to an addicted mother will be addicted at birth. The baby will go through the same withdrawal an adult does.  Let your caregiver know about any medicines that you have to take and for what reason you take them. SEEK MEDICAL CARE IF:  You have any concerns or worries during your  pregnancy. It is better to call with your questions if you feel they cannot wait, rather than worry about them. SEEK IMMEDIATE MEDICAL CARE IF:   An unexplained oral temperature above 102 F (38.9 C) develops, or as your caregiver suggests.  You have leaking of fluid from the vagina (birth canal). If leaking membranes are suspected, take your temperature and inform your caregiver of this when you call.  There is vaginal spotting or bleeding. Notify your caregiver of the amount and how many pads are used.  You develop a bad smelling vaginal discharge with a change in the color.  You continue to feel sick to your stomach (nauseated) and have no relief from remedies suggested. You vomit blood or coffee ground-like materials.  You lose more than 2 pounds of weight in 1 week.  You gain more than 2 pounds of weight in 1 week and you notice swelling of your face, hands, feet, or legs.  You gain 5 pounds or more in 1 week (even if you do not have swelling of your hands, face, legs, or feet).  You get exposed to [place] measles and have never had them.  You are exposed to fifth disease or chickenpox.  You develop belly (abdominal) pain. Round ligament discomfort is a common non-cancerous (benign) cause of abdominal pain in pregnancy. Your caregiver still must evaluate this.  You develop headache, fever, diarrhea, pain with urination, or shortness of breath.  You fall or are in a car accident or have any kind of trauma.  There is mental or physical violence in your home. Document Released: 08/01/2001 Document Revised: 05/01/2012 Document Reviewed: 02/02/2009 [facility] Patient Information 2014 [place], [place]. Smoking Cessation Quitting smoking is important to your health and has many advantages. However, it is not always easy to quit since nicotine is a very addictive drug. Often times, people try 3 times or more before being able to quit. This document explains the best ways for you to prepare  to quit smoking. Quitting takes hard work and a lot of effort, but you can do it. ADVANTAGES OF QUITTING SMOKING  You will live longer, feel better, and live better.  Your body will feel the impact of quitting smoking almost immediately.  Within 20 minutes, blood pressure decreases. Your pulse returns to its normal level.  After 8 hours, carbon monoxide levels in the blood return to normal. Your oxygen level increases.  After 24 hours, the chance of having a heart attack starts to decrease. Your breath, hair, and body stop smelling like smoke.  After 48 hours, damaged nerve endings begin to recover. Your sense of taste  and smell improve.  After 72 hours, the body is virtually free of nicotine. Your bronchial tubes relax and breathing becomes easier.  After 2 to 12 weeks, lungs can hold more air. Exercise becomes easier and circulation improves.  The risk of having a heart attack, stroke, cancer, or lung disease is greatly reduced.  After 1 year, the risk of coronary heart disease is cut in half.  After 5 years, the risk of stroke falls to the same as a nonsmoker.  After 10 years, the risk of lung cancer is cut in half and the risk of other cancers decreases significantly.  After 15 years, the risk of coronary heart disease drops, usually to the level of a nonsmoker.  If you are pregnant, quitting smoking will improve your chances of having a healthy baby.  The people you live with, especially any children, will be healthier.  You will have extra money to spend on things other than cigarettes. QUESTIONS TO THINK ABOUT BEFORE ATTEMPTING TO QUIT You may want to talk about your answers with your caregiver.  Why do you want to quit?  If you tried to quit in the past, what helped and what did not?  What will be the most difficult situations for you after you quit? How will you plan to handle them?  Who can help you through the tough times? Your family? Friends? A  caregiver?  What pleasures do you get from smoking? What ways can you still get pleasure if you quit? Here are some questions to ask your caregiver:  How can you help me to be successful at quitting?  What medicine do you think would be best for me and how should I take it?  What should I do if I need more help?  What is smoking withdrawal like? How can I get information on withdrawal? GET READY  Set a quit date.  Change your environment by getting rid of all cigarettes, ashtrays, matches, and lighters in your home, car, or work. Do not let people smoke in your home.  Review your past attempts to quit. Think about what worked and what did not. GET SUPPORT AND ENCOURAGEMENT You have a better chance of being successful if you have help. You can get support in many ways.  Tell your family, friends, and co-workers that you are going to quit and need their support. Ask them not to smoke around you.  Get individual, group, or telephone counseling and support. Programs are available at [employer] and health centers. Call your local health department for information about programs in your area.  Spiritual beliefs and practices may help some smokers quit.  Download a "quit meter" on your computer to keep track of quit statistics, such as how long you have gone without smoking, cigarettes not smoked, and money saved.  Get a self-help book about quitting smoking and staying off of tobacco. LEARN NEW SKILLS AND BEHAVIORS  Distract yourself from urges to smoke. Talk to someone, go for a walk, or occupy your time with a task.  Change your normal routine. Take a different route to work. Drink tea instead of coffee. Eat breakfast in a different place.  Reduce your stress. Take a hot bath, exercise, or read a book.  Plan something enjoyable to do every day. Reward yourself for not smoking.  Explore interactive web-based programs that specialize in helping you quit. GET MEDICINE AND USE  IT CORRECTLY Medicines can help you stop smoking and decrease the urge to smoke.  Combining medicine with the above behavioral methods and support can greatly increase your chances of successfully quitting smoking.  Nicotine replacement therapy helps deliver nicotine to your body without the negative effects and risks of smoking. Nicotine replacement therapy includes nicotine gum, lozenges, inhalers, nasal sprays, and skin patches. Some may be available over-the-counter and others require a prescription.  Antidepressant medicine helps people abstain from smoking, but how this works is unknown. This medicine is available by prescription.  Nicotinic receptor partial agonist medicine simulates the effect of nicotine in your brain. This medicine is available by prescription. Ask your caregiver for advice about which medicines to use and how to use them based on your health history. Your caregiver will tell you what side effects to look out for if you choose to be on a medicine or therapy. Carefully read the information on the package. Do not use any other product containing nicotine while using a nicotine replacement product.  RELAPSE OR DIFFICULT SITUATIONS Most relapses occur within the first 3 months after quitting. Do not be discouraged if you start smoking again. Remember, most people try several times before finally quitting. You may have symptoms of withdrawal because your body is used to nicotine. You may crave cigarettes, be irritable, feel very hungry, cough often, get headaches, or have difficulty concentrating. The withdrawal symptoms are only temporary. They are strongest when you first quit, but they will go away within 10 14 days. To reduce the chances of relapse, try to:  Avoid drinking alcohol. Drinking lowers your chances of successfully quitting.  Reduce the amount of caffeine you consume. Once you quit smoking, the amount of caffeine in your body increases and can give you symptoms,  such as a rapid heartbeat, sweating, and anxiety.  Avoid smokers because they can make you want to smoke.  Do not let weight gain distract you. Many smokers will gain weight when they quit, usually less than 10 pounds. Eat a healthy diet and stay active. You can always lose the weight gained after you quit.  Find ways to improve your mood other than smoking. FOR MORE INFORMATION  www.smokefree.gov  Document Released: 08/01/2001 Document Revised: 02/06/2012 Document Reviewed: 11/16/2011 [facility] Patient Information 2014 [place], [place].

## 2013-06-11 NOTE — Addendum Note (Signed)
Addended by: Franchot Mimes on: 06/11/2013 02:25 PM   Modules accepted: Orders

## 2013-06-11 NOTE — Progress Notes (Signed)
Informal [place] for viability- FHR = 170 per PW doppler.  Deirdre Christy Gentles CNM notified

## 2013-06-11 NOTE — Addendum Note (Signed)
Addended by: Louanna Raw on: 06/11/2013 11:16 AM   Modules accepted: Orders

## 2013-06-11 NOTE — Progress Notes (Signed)
Pulse: 75

## 2013-06-12 LAB — OBSTETRIC PANEL
Eosinophils Absolute: 0.2 10*3/uL (ref 0.0–0.7)
Eosinophils Relative: 2 % (ref 0–5)
Hemoglobin: 13.2 g/dL (ref 12.0–15.0)
Lymphocytes Relative: 34 % (ref 12–46)
Lymphs Abs: 2.3 10*3/uL (ref 0.7–4.0)
MCH: 30.1 pg (ref 26.0–34.0)
MCV: 87 fL (ref 78.0–100.0)
Monocytes Absolute: 0.5 10*3/uL (ref 0.1–1.0)
Monocytes Relative: 8 % (ref 3–12)
Neutro Abs: 3.9 10*3/uL (ref 1.7–7.7)
Platelets: 205 10*3/uL (ref 150–400)
RBC: 4.39 MIL/uL (ref 3.87–5.11)
Rh Type: NEGATIVE
WBC: 6.9 10*3/uL (ref 4.0–10.5)

## 2013-06-12 LAB — PRESCRIPTION MONITORING PROFILE (19 PANEL)
Barbiturate Screen, Urine: NEGATIVE ng/mL
Benzodiazepine Screen, Urine: NEGATIVE ng/mL
Cannabinoid Scrn, Ur: NEGATIVE ng/mL
Cocaine Metabolites: NEGATIVE ng/mL
Fentanyl, Ur: NEGATIVE ng/mL
Methaqualone: NEGATIVE ng/mL
Opiate Screen, Urine: NEGATIVE ng/mL
Phencyclidine, Ur: NEGATIVE ng/mL
Tapentadol, urine: NEGATIVE ng/mL
Tramadol Scrn, Ur: NEGATIVE ng/mL
Zolpidem, Urine: NEGATIVE ng/mL

## 2013-06-12 LAB — GLUCOSE TOLERANCE, 1 HOUR (50G) W/O FASTING: Glucose, 1 Hour GTT: 107 mg/dL (ref 70–140)

## 2013-06-12 LAB — HIV ANTIBODY (ROUTINE TESTING W REFLEX): HIV: NONREACTIVE

## 2013-06-12 LAB — ALCOHOL METABOLITE (ETG), URINE: Ethyl Glucuronide (EtG): NEGATIVE ng/mL

## 2013-06-13 DIAGNOSIS — Z6791 Unspecified blood type, Rh negative: Secondary | ICD-10-CM | POA: Insufficient documentation

## 2013-07-03 ENCOUNTER — Other Ambulatory Visit: Payer: Self-pay | Admitting: Advanced Practice Midwife

## 2013-07-03 DIAGNOSIS — Z3682 Encounter for antenatal screening for nuchal translucency: Secondary | ICD-10-CM

## 2013-07-09 ENCOUNTER — Encounter: Payer: Self-pay | Admitting: *Deleted

## 2013-07-09 ENCOUNTER — Other Ambulatory Visit: Payer: Self-pay

## 2013-07-09 ENCOUNTER — Ambulatory Visit (HOSPITAL_COMMUNITY)
Admission: RE | Admit: 2013-07-09 | Discharge: 2013-07-09 | Disposition: A | Discharge status: Home / Self Care | Payer: Medicaid Other | Source: Ambulatory Visit | Attending: Obstetrics and Gynecology | Admitting: Obstetrics and Gynecology

## 2013-07-09 ENCOUNTER — Encounter (HOSPITAL_COMMUNITY): Payer: Self-pay

## 2013-07-09 ENCOUNTER — Ambulatory Visit (HOSPITAL_COMMUNITY)
Admission: RE | Admit: 2013-07-09 | Discharge: 2013-07-09 | Disposition: A | Discharge status: Home / Self Care | Payer: Medicaid Other | Source: Ambulatory Visit | Attending: Advanced Practice Midwife | Admitting: Advanced Practice Midwife

## 2013-07-09 ENCOUNTER — Ambulatory Visit (INDEPENDENT_AMBULATORY_CARE_PROVIDER_SITE_OTHER): Payer: Medicaid Other | Admitting: Advanced Practice Midwife

## 2013-07-09 VITALS — BP 118/66 | Temp 98.0°F | Wt 316.0 lb

## 2013-07-09 DIAGNOSIS — Z3491 Encounter for supervision of normal pregnancy, unspecified, first trimester: Secondary | ICD-10-CM

## 2013-07-09 DIAGNOSIS — O3510X0 Maternal care for (suspected) chromosomal abnormality in fetus, unspecified, not applicable or unspecified: Secondary | ICD-10-CM | POA: Insufficient documentation

## 2013-07-09 DIAGNOSIS — Z3682 Encounter for antenatal screening for nuchal translucency: Secondary | ICD-10-CM

## 2013-07-09 DIAGNOSIS — Z3689 Encounter for other specified antenatal screening: Secondary | ICD-10-CM | POA: Insufficient documentation

## 2013-07-09 DIAGNOSIS — O351XX0 Maternal care for (suspected) chromosomal abnormality in fetus, not applicable or unspecified: Secondary | ICD-10-CM | POA: Insufficient documentation

## 2013-07-09 DIAGNOSIS — O360990 Maternal care for other rhesus isoimmunization, unspecified trimester, not applicable or unspecified: Secondary | ICD-10-CM

## 2013-07-09 DIAGNOSIS — O99330 Smoking (tobacco) complicating pregnancy, unspecified trimester: Secondary | ICD-10-CM

## 2013-07-09 LAB — POCT URINALYSIS DIP (DEVICE)
Bilirubin Urine: NEGATIVE
Glucose, UA: NEGATIVE mg/dL
Hgb urine dipstick: NEGATIVE
Nitrite: NEGATIVE
Protein, ur: NEGATIVE mg/dL
Urobilinogen, UA: 0.2 mg/dL (ref 0.0–1.0)

## 2013-07-09 MED ORDER — PROMETHAZINE HCL 25 MG PO TABS: 25.0000 mg | ORAL_TABLET | Freq: Four times a day (QID) | ORAL | Status: DC | PRN

## 2013-07-09 MED ORDER — HYDROXYZINE PAMOATE 25 MG PO CAPS: 25.0000 mg | ORAL_CAPSULE | Freq: Three times a day (TID) | ORAL | Status: DC | PRN

## 2013-07-09 NOTE — Progress Notes (Signed)
Patient is having constipation. She has tried increased fiber, prune juice and colace. She is also having vomiting. She has been able to keep down fluids, but will vomit food. She is down about 15lbs. She is also having anxiety. She has been on ativan in the past, and she has tried benadryl, but it is not helping. She has tried zoloft and other antidepressants in the past, but she does not like the way it makes her feel. She has taken vistiril in the past for anxiety, and it has worked ok for her. Will try adding that back in to see if that helps. She feels that it is situational, and is hoping her anxiety will improve when things improve at home. She denies suicidal or homicidal ideations. She is taking tums and pepcid for heartburn.   Unable to hear FHT, but She had a FIRST screen today.

## 2013-07-09 NOTE — Progress Notes (Signed)
P= 84 C/o of severe heartburn, relieved a little by tums, and N/V to the point where she cant keep anything down. Would like something for nausea.

## 2013-07-11 ENCOUNTER — Encounter: Payer: Self-pay | Admitting: *Deleted

## 2013-07-11 DIAGNOSIS — O99330 Smoking (tobacco) complicating pregnancy, unspecified trimester: Secondary | ICD-10-CM | POA: Insufficient documentation

## 2013-07-16 ENCOUNTER — Encounter: Payer: Self-pay | Admitting: *Deleted

## 2013-07-16 DIAGNOSIS — Z3491 Encounter for supervision of normal pregnancy, unspecified, first trimester: Secondary | ICD-10-CM

## 2013-07-23 ENCOUNTER — Encounter (HOSPITAL_COMMUNITY): Payer: Self-pay

## 2013-07-23 ENCOUNTER — Inpatient Hospital Stay (EMERGENCY_DEPARTMENT_HOSPITAL)
Admission: AD | Admit: 2013-07-23 | Discharge: 2013-07-24 | Disposition: A | Discharge status: Home / Self Care | Payer: Medicaid Other | Source: Ambulatory Visit | Attending: Obstetrics and Gynecology | Admitting: Obstetrics and Gynecology

## 2013-07-23 ENCOUNTER — Inpatient Hospital Stay (HOSPITAL_COMMUNITY)
Admission: AD | Admit: 2013-07-23 | Discharge: 2013-07-23 | Disposition: A | Discharge status: Home / Self Care | Payer: Medicaid Other | Source: Ambulatory Visit | Attending: Obstetrics & Gynecology | Admitting: Obstetrics & Gynecology

## 2013-07-23 DIAGNOSIS — K5289 Other specified noninfective gastroenteritis and colitis: Secondary | ICD-10-CM

## 2013-07-23 DIAGNOSIS — O210 Mild hyperemesis gravidarum: Secondary | ICD-10-CM | POA: Insufficient documentation

## 2013-07-23 DIAGNOSIS — O99891 Other specified diseases and conditions complicating pregnancy: Secondary | ICD-10-CM | POA: Insufficient documentation

## 2013-07-23 DIAGNOSIS — R197 Diarrhea, unspecified: Secondary | ICD-10-CM | POA: Insufficient documentation

## 2013-07-23 DIAGNOSIS — R509 Fever, unspecified: Secondary | ICD-10-CM | POA: Insufficient documentation

## 2013-07-23 DIAGNOSIS — N390 Urinary tract infection, site not specified: Secondary | ICD-10-CM | POA: Insufficient documentation

## 2013-07-23 DIAGNOSIS — K529 Noninfective gastroenteritis and colitis, unspecified: Secondary | ICD-10-CM

## 2013-07-23 DIAGNOSIS — O2390 Unspecified genitourinary tract infection in pregnancy, unspecified trimester: Secondary | ICD-10-CM | POA: Insufficient documentation

## 2013-07-23 DIAGNOSIS — O2342 Unspecified infection of urinary tract in pregnancy, second trimester: Secondary | ICD-10-CM

## 2013-07-23 LAB — URINALYSIS, ROUTINE W REFLEX MICROSCOPIC
Bilirubin Urine: NEGATIVE
Glucose, UA: 500 mg/dL — AB
Leukocytes, UA: NEGATIVE
Leukocytes, UA: NEGATIVE
Nitrite: POSITIVE — AB
Protein, ur: NEGATIVE mg/dL
Specific Gravity, Urine: >1.03 — ABNORMAL HIGH (ref 1.005–1.030)
Specific Gravity, Urine: >1.03 — ABNORMAL HIGH (ref 1.005–1.030)
pH: 5.5 (ref 5.0–8.0)
pH: 5.5 (ref 5.0–8.0)

## 2013-07-23 LAB — CBC WITH DIFFERENTIAL/PLATELET
Basophils Absolute: 0 10*3/uL (ref 0.0–0.1)
HCT: 36.8 % (ref 36.0–46.0)
Hemoglobin: 13.1 g/dL (ref 12.0–15.0)
Lymphocytes Relative: 10 % — ABNORMAL LOW (ref 12–46)
Lymphs Abs: 0.6 10*3/uL — ABNORMAL LOW (ref 0.7–4.0)
MCH: 30.6 pg (ref 26.0–34.0)
Monocytes Absolute: 0.4 10*3/uL (ref 0.1–1.0)
Monocytes Relative: 6 % (ref 3–12)
Neutro Abs: 5.3 10*3/uL (ref 1.7–7.7)
Platelets: 152 10*3/uL (ref 150–400)
RBC: 4.28 MIL/uL (ref 3.87–5.11)
RDW: 12.7 % (ref 11.5–15.5)
WBC: 6.3 10*3/uL (ref 4.0–10.5)

## 2013-07-23 LAB — COMPREHENSIVE METABOLIC PANEL
Albumin: 3.6 g/dL (ref 3.5–5.2)
Alkaline Phosphatase: 49 U/L (ref 39–117)
BUN: 12 mg/dL (ref 6–23)
Calcium: 9.4 mg/dL (ref 8.4–10.5)
Chloride: 104 mEq/L (ref 96–112)
Creatinine, Ser: 0.58 mg/dL (ref 0.50–1.10)
Total Bilirubin: 0.4 mg/dL (ref 0.3–1.2)
Total Protein: 6.8 g/dL (ref 6.0–8.3)

## 2013-07-23 LAB — URINE MICROSCOPIC-ADD ON

## 2013-07-23 LAB — CBC
HCT: 38.5 % (ref 36.0–46.0)
Hemoglobin: 13.9 g/dL (ref 12.0–15.0)
MCH: 30.7 pg (ref 26.0–34.0)
MCHC: 36.1 g/dL — ABNORMAL HIGH (ref 30.0–36.0)
MCV: 85 fL (ref 78.0–100.0)
Platelets: 181 10*3/uL (ref 150–400)
RDW: 12.7 % (ref 11.5–15.5)
WBC: 12.6 10*3/uL — ABNORMAL HIGH (ref 4.0–10.5)

## 2013-07-23 MED ORDER — LACTATED RINGERS IV BOLUS (SEPSIS)
1000.0000 mL | Freq: Once | INTRAVENOUS | Status: AC
  Administered 2013-07-23: 1000 mL via INTRAVENOUS

## 2013-07-23 MED ORDER — ONDANSETRON 8 MG/NS 50 ML IVPB
8.0000 mg | Freq: Once | INTRAVENOUS | Status: AC
  Administered 2013-07-23: 8 mg via INTRAVENOUS
  Filled 2013-07-23: qty 8

## 2013-07-23 MED ORDER — LACTATED RINGERS IV BOLUS (SEPSIS): 1000.0000 mL | Freq: Once | INTRAVENOUS | Status: DC

## 2013-07-23 MED ORDER — ONDANSETRON HCL 4 MG/2ML IJ SOLN
4.0000 mg | INTRAMUSCULAR | Status: AC
  Administered 2013-07-23: 4 mg via INTRAVENOUS
  Filled 2013-07-23: qty 2

## 2013-07-23 MED ORDER — PROMETHAZINE HCL 25 MG/ML IJ SOLN
25.0000 mg | Freq: Once | INTRAMUSCULAR | Status: AC
  Administered 2013-07-23: 25 mg via INTRAVENOUS
  Filled 2013-07-23: qty 1

## 2013-07-23 MED ORDER — DEXTROSE 5 % IN LACTATED RINGERS IV BOLUS
1000.0000 mL | Freq: Once | INTRAVENOUS | Status: AC
  Administered 2013-07-23: 1000 mL via INTRAVENOUS

## 2013-07-23 MED ORDER — ACETAMINOPHEN 500 MG PO TABS
1000.0000 mg | ORAL_TABLET | Freq: Once | ORAL | Status: AC
  Administered 2013-07-23: 1000 mg via ORAL
  Filled 2013-07-23: qty 2

## 2013-07-23 MED ORDER — LOPERAMIDE HCL 2 MG PO CAPS
4.0000 mg | ORAL_CAPSULE | Freq: Once | ORAL | Status: AC
  Administered 2013-07-23: 4 mg via ORAL
  Filled 2013-07-23: qty 2

## 2013-07-23 MED ORDER — PROMETHAZINE HCL 25 MG/ML IJ SOLN
25.0000 mg | Freq: Once | INTRAVENOUS | Status: AC
  Administered 2013-07-23: 25 mg via INTRAVENOUS
  Filled 2013-07-23: qty 1

## 2013-07-23 MED ORDER — DIPHENHYDRAMINE HCL 50 MG/ML IJ SOLN
25.0000 mg | INTRAMUSCULAR | Status: AC
  Administered 2013-07-23: 25 mg via INTRAVENOUS
  Filled 2013-07-23: qty 1

## 2013-07-23 MED ORDER — IBUPROFEN 800 MG PO TABS
800.0000 mg | ORAL_TABLET | Freq: Once | ORAL | Status: AC
  Administered 2013-07-23: 800 mg via ORAL
  Filled 2013-07-23: qty 1

## 2013-07-23 MED ORDER — ONDANSETRON HCL 4 MG PO TABS: 4.0000 mg | ORAL_TABLET | Freq: Three times a day (TID) | ORAL | Status: DC | PRN

## 2013-07-23 MED ORDER — PROMETHAZINE HCL 25 MG PO TABS: 12.5000 mg | ORAL_TABLET | Freq: Four times a day (QID) | ORAL | Status: DC | PRN

## 2013-07-23 MED ORDER — CEPHALEXIN 500 MG PO CAPS
500.0000 mg | ORAL_CAPSULE | Freq: Once | ORAL | Status: AC
  Administered 2013-07-24: 500 mg via ORAL
  Filled 2013-07-23: qty 1

## 2013-07-23 NOTE — MAU Provider Note (Signed)
Chief Complaint: Fever, Fatigue and Diarrhea  First Provider Initiated Contact with Patient 07/23/13 2108      SUBJECTIVE HPI: [name] is a 22 y.o. G1P0 at [redacted]w[redacted]d by LMP who presents with fever of 100.7 that did not resolve after Tylenol, diarrhea, HA, body aches and fatigue. . Was seen in MAU for sudden onset of severe N/V/D. Suspected food poisoning. N/V controlled w/ antiemetics, but returned to MAU primarily due to concern about fever. Able to keep down fluids. Denies pregnancy complaints.   HA 6/10, generalized, dull. Diarrhea was watery, >60 x yesterday, less today, more formed.    Past Medical History  Diagnosis Date  . Endometriosis   . Asthma   . PCOS (polycystic ovarian syndrome)   . Animal bite of finger     a cat  . Bronchitis    OB History  Gravida Para Term Preterm AB SAB TAB Ectopic Multiple Living  1             # Outcome Date GA Lbr Len/2nd Weight Sex Delivery Anes PTL Lv  1 CUR              Past Surgical History  Procedure Laterality Date  . Mouth surgery     History   Social History  . Marital Status: Married    Spouse Name: N/A    Number of Children: N/A  . Years of Education: N/A   Occupational History  . Not on file.   Social History Main Topics  . Smoking status: Current Some Day Smoker -- 0.25 packs/day  . Smokeless tobacco: Never Used  . Alcohol Use: No  . Drug Use: No     Comment: exposed to THC from "pot butter"  . Sexual Activity: Yes    Birth Control/ Protection: None   Other Topics Concern  . Not on file   Social History Narrative  . No narrative on file   No current facility-administered medications on file prior to encounter.   Current Outpatient Prescriptions on File Prior to Encounter  Medication Sig Dispense Refill  . calcium carbonate (TUMS - DOSED IN MG ELEMENTAL CALCIUM) 500 MG chewable tablet Chew 2 tablets by mouth 3 (three) times daily as needed for heartburn. For upset stomach      . famotidine (PEPCID) 10  MG tablet Take 10 mg by mouth 2 (two) times daily.      . hydrOXYzine (VISTARIL) 25 MG capsule Take 1 capsule (25 mg total) by mouth 3 (three) times daily as needed.  30 capsule  1  . ondansetron (ZOFRAN) 4 MG tablet Take 1 tablet (4 mg total) by mouth every 8 (eight) hours as needed for nausea or vomiting.  20 tablet  0  . Prenatal Vit-Fe Fumarate-FA (PRENATAL MULTIVITAMIN) TABS tablet Take 1 tablet by mouth daily at 12 noon.      . promethazine (PHENERGAN) 25 MG tablet Take 1 tablet (25 mg total) by mouth every 6 (six) hours as needed for nausea or vomiting.  30 tablet  1   Allergies  Allergen Reactions  . Amoxicillin     Needs probiotic. Causes yeast infections  . Albuterol Sulfate Palpitations    Just a sensitivity/patient still uses this medication    ROS: Pertinent pos items in HPI. Neg for neck stiffness, difficulties w/ speech or gait, weakness, numbness.   OBJECTIVE Blood pressure 107/62, pulse 86, temperature 98.3 F (36.8 C), temperature source Axillary, resp. rate 18, last menstrual period 03/13/2013, SpO2 100.00%. GENERAL:  Well-developed, well-nourished female in no acute distress. Mildly ill-appearing.  HEENT: Normocephalic. Sinuses NT. No rhinorrhea. Normal ROM of neck. Mucus membranes moist.  HEART: normal rate RESP: normal effort. CTAB. No cough.  ABDOMEN: Soft, non-tender. No CVAT.  EXTREMITIES: Nontender, no edema NEURO: Alert and oriented. Normal speech and gait.  SPECULUM EXAM: Deferred. FHR 160 by doppler.   LAB RESULTS Results for orders placed during the hospital encounter of 07/23/13 (from the past 24 hour(s))  URINALYSIS, ROUTINE W REFLEX MICROSCOPIC     Status: Abnormal   Collection Time    07/23/13  9:18 PM      Result Value Range   Color, Urine YELLOW  YELLOW   APPearance TURBID (*) CLEAR   Specific Gravity, Urine >1.030 (*) 1.005 - 1.030   pH 5.5  5.0 - 8.0   Glucose, UA NEGATIVE  NEGATIVE mg/dL   Hgb urine dipstick NEGATIVE  NEGATIVE   Bilirubin  Urine NEGATIVE  NEGATIVE   Ketones, ur 15 (*) NEGATIVE mg/dL   Protein, ur NEGATIVE  NEGATIVE mg/dL   Urobilinogen, UA 0.2  0.0 - 1.0 mg/dL   Nitrite POSITIVE (*) NEGATIVE   Leukocytes, UA NEGATIVE  NEGATIVE  URINE MICROSCOPIC-ADD ON     Status: Abnormal   Collection Time    07/23/13  9:18 PM      Result Value Range   Squamous Epithelial / LPF RARE  RARE   WBC, UA 0-2  <3 WBC/hpf   RBC / HPF 0-2  <3 RBC/hpf   Bacteria, UA MANY (*) RARE  CBC WITH DIFFERENTIAL     Status: Abnormal   Collection Time    07/23/13  9:33 PM      Result Value Range   WBC 6.3  4.0 - 10.5 K/uL   RBC 4.28  3.87 - 5.11 MIL/uL   Hemoglobin 13.1  12.0 - 15.0 g/dL   HCT 40.9  81.1 - 91.4 %   MCV 86.0  78.0 - 100.0 fL   MCH 30.6  26.0 - 34.0 pg   MCHC 35.6  30.0 - 36.0 g/dL   RDW 78.2  95.6 - 21.3 %   Platelets 152  150 - 400 K/uL   Neutrophils Relative % 83 (*) 43 - 77 %   Neutro Abs 5.3  1.7 - 7.7 K/uL   Lymphocytes Relative 10 (*) 12 - 46 %   Lymphs Abs 0.6 (*) 0.7 - 4.0 K/uL   Monocytes Relative 6  3 - 12 %   Monocytes Absolute 0.4  0.1 - 1.0 K/uL   Eosinophils Relative 0  0 - 5 %   Eosinophils Absolute 0.0  0.0 - 0.7 K/uL   Basophils Relative 0  0 - 1 %   Basophils Absolute 0.0  0.0 - 0.1 K/uL    IMAGING NA  MAU COURSE IV fluids,Ibuprofen, Tylenol, Rocephin, Imodium, Zofran, Phenergan given.  Feeling much better. Fever controlled. 98.3 at time of D/C.   ASSESSMENT 1. Gastroenteritis, acute   2. UTI in pregnancy, antepartum, second trimester     PLAN Discharge home instable condition. Increase fluids and rest. Pyelo precautions.  Fluids only x 24 hours, then advance diet slowly. Alternate Tylenol and Ibuprofen Q 3 hours. Return to MAU if unable to keep fever below 100.4.      Follow-up Information   Follow up with [facility]. (as scheduled or as needed if symptoms worsen)    Specialty:  Obstetrics and Gynecology   Contact information:   [address]  [place]  [place] [address] [phone]      Follow up with [facility] [place] [facility]. (As needed if symptoms worsen)    Contact information:   [address] [id number] [place] [address] [phone]       Medication List         acetaminophen 500 MG tablet  Commonly known as:  TYLENOL  Take 1,000 mg by mouth every 6 (six) hours as needed for mild pain, moderate pain, fever or headache.     albuterol 108 (90 BASE) MCG/ACT inhaler  Commonly known as:  PROVENTIL HFA;VENTOLIN HFA  Inhale 2 puffs into the lungs every 6 (six) hours as needed for wheezing or shortness of breath.     calcium carbonate 500 MG chewable tablet  Commonly known as:  TUMS - dosed in mg elemental calcium  Chew 2 tablets by mouth 3 (three) times daily as needed for heartburn. For upset stomach     cephALEXin 500 MG capsule  Commonly known as:  KEFLEX  Take 1 capsule (500 mg total) by mouth 4 (four) times daily.     famotidine 10 MG tablet  Commonly known as:  PEPCID  Take 10 mg by mouth 2 (two) times daily.     fluconazole 150 MG tablet  Commonly known as:  DIFLUCAN  Take 1 tablet (150 mg total) by mouth daily.     hydrOXYzine 25 MG capsule  Commonly known as:  VISTARIL  Take 1 capsule (25 mg total) by mouth 3 (three) times daily as needed.     ibuprofen 600 MG tablet  Commonly known as:  ADVIL,MOTRIN  Take 1 tablet (600 mg total) by mouth every 6 (six) hours as needed for fever, headache or moderate pain.     ondansetron 4 MG tablet  Commonly known as:  ZOFRAN  Take 1 tablet (4 mg total) by mouth every 8 (eight) hours as needed for nausea or vomiting.     prenatal multivitamin Tabs tablet  Take 1 tablet by mouth daily at 12 noon.     promethazine 25 MG tablet  Commonly known as:  PHENERGAN  Take 1 tablet (25 mg total) by mouth every 6 (six) hours as needed for nausea or vomiting.       [place], CNM 07/24/2013  12:54 AM

## 2013-07-23 NOTE — MAU Note (Signed)
Patient states that she was here yesterday and was dx with food poisoning. She had n/v/d. She states that she only have diarrhea today. She is c/o high fever 100.21f after 1000mg  tylenol, headache, fatigue. She denies vaginal bleeding

## 2013-07-23 NOTE — MAU Provider Note (Signed)
Chief Complaint: Emesis, Diarrhea and Abdominal Pain   None    SUBJECTIVE HPI: [name] is a 22 y.o. G1P0 at [redacted]w[redacted]d by LMP who presents to maternity admissions reporting n/v/d x3 hours after eating a sandwich from [facility].  She was planning to wait until tomorrow to be seen but she became dizzy and fell at home and her husband told her she needed to be seen tonight.  She is concerned she may have food poisoning because her symptoms started suddenly and have been severe since ~2 hours after her meal.  She denies vaginal bleeding, vaginal itching/burning, urinary symptoms, h/a, or fever/chills.     Past Medical History  Diagnosis Date  . Endometriosis   . Asthma   . PCOS (polycystic ovarian syndrome)   . Animal bite of finger     a cat  . Bronchitis    Past Surgical History  Procedure Laterality Date  . Mouth surgery     History   Social History  . Marital Status: Married    Spouse Name: N/A    Number of Children: N/A  . Years of Education: N/A   Occupational History  . Not on file.   Social History Main Topics  . Smoking status: Current Some Day Smoker -- 0.25 packs/day  . Smokeless tobacco: Never Used  . Alcohol Use: No  . Drug Use: No     Comment: exposed to THC from "pot butter"  . Sexual Activity: Yes    Birth Control/ Protection: None   Other Topics Concern  . Not on file   Social History Narrative  . No narrative on file   No current facility-administered medications on file prior to encounter.   Current Outpatient Prescriptions on File Prior to Encounter  Medication Sig Dispense Refill  . albuterol (PROVENTIL HFA;VENTOLIN HFA) 108 (90 BASE) MCG/ACT inhaler Inhale 2 puffs into the lungs every 4 (four) hours as needed for wheezing or shortness of breath.  1 Inhaler  3  . albuterol (PROVENTIL HFA;VENTOLIN HFA) 108 (90 BASE) MCG/ACT inhaler Inhale 2 puffs into the lungs every 4 (four) hours as needed (cough).  1 Inhaler  0  . calcium carbonate (TUMS -  DOSED IN MG ELEMENTAL CALCIUM) 500 MG chewable tablet Chew 2 tablets by mouth 3 (three) times daily as needed for heartburn. For upset stomach      . Prenatal Vit-Fe Fumarate-FA (PRENATAL MULTIVITAMIN) TABS tablet Take 1 tablet by mouth daily at 12 noon.      . promethazine (PHENERGAN) 25 MG tablet Take 1 tablet (25 mg total) by mouth every 6 (six) hours as needed for nausea or vomiting.  30 tablet  1  . dextromethorphan (DELSYM) 30 MG/5ML liquid Take 60 mg by mouth as needed for cough.      . famotidine (PEPCID) 10 MG tablet Take 10 mg by mouth 2 (two) times daily.      Marland Kitchen guaifenesin (HUMIBID E) 400 MG TABS tablet Take 400 mg by mouth every 4 (four) hours.      . hydrOXYzine (VISTARIL) 25 MG capsule Take 1 capsule (25 mg total) by mouth 3 (three) times daily as needed.  30 capsule  1  . sodium chloride (OCEAN) 0.65 % nasal spray Place 1 spray into the nose as needed for congestion.       Allergies  Allergen Reactions  . Amoxicillin     Needs probiotic. Causes yeast infections  . Albuterol Sulfate Palpitations    ROS: Pertinent items in  HPI  OBJECTIVE Blood pressure 132/88, pulse 126, temperature 99.3 F (37.4 C), temperature source Oral, resp. rate 20, height 5\' 8"  (1.727 m), weight 142.429 kg (314 lb), last menstrual period 03/13/2013, SpO2 100.00%. GENERAL: Well-developed, well-nourished female in moderate distress.  HEENT: Normocephalic HEART: mild tachycardia RESP: normal effort ABDOMEN: Soft, non-tender EXTREMITIES: Nontender, no edema NEURO: Alert and oriented   LAB RESULTS Results for orders placed during the hospital encounter of 07/23/13 (from the past 168 hour(s))  CBC   Collection Time    07/23/13  4:12 AM      Result Value Range   WBC 12.6 (*) 4.0 - 10.5 K/uL   RBC 4.53  3.87 - 5.11 MIL/uL   Hemoglobin 13.9  12.0 - 15.0 g/dL   HCT 16.1  09.6 - 04.5 %   MCV 85.0  78.0 - 100.0 fL   MCH 30.7  26.0 - 34.0 pg   MCHC 36.1 (*) 30.0 - 36.0 g/dL   RDW 40.9  81.1 - 91.4  %   Platelets 181  150 - 400 K/uL  COMPREHENSIVE METABOLIC PANEL   Collection Time    07/23/13  4:12 AM      Result Value Range   Sodium 137  135 - 145 mEq/L   Potassium 4.5  3.5 - 5.1 mEq/L   Chloride 104  96 - 112 mEq/L   CO2 20  19 - 32 mEq/L   Glucose, Bld 107 (*) 70 - 99 mg/dL   BUN 12  6 - 23 mg/dL   Creatinine, Ser 7.82  0.50 - 1.10 mg/dL   Calcium 9.4  8.4 - 95.6 mg/dL   Total Protein 6.8  6.0 - 8.3 g/dL   Albumin 3.6  3.5 - 5.2 g/dL   AST 32  0 - 37 U/L   ALT 19  0 - 35 U/L   Alkaline Phosphatase 49  39 - 117 U/L   Total Bilirubin 0.4  0.3 - 1.2 mg/dL   GFR calc non Af Amer >90  >90 mL/min   GFR calc Af Amer >90  >90 mL/min  URINALYSIS, ROUTINE W REFLEX MICROSCOPIC   Collection Time    07/23/13  5:07 AM      Result Value Range   Color, Urine YELLOW  YELLOW   APPearance CLEAR  CLEAR   Specific Gravity, Urine >1.030 (*) 1.005 - 1.030   pH 5.5  5.0 - 8.0   Glucose, UA 500 (*) NEGATIVE mg/dL   Hgb urine dipstick NEGATIVE  NEGATIVE   Bilirubin Urine NEGATIVE  NEGATIVE   Ketones, ur 15 (*) NEGATIVE mg/dL   Protein, ur NEGATIVE  NEGATIVE mg/dL   Urobilinogen, UA 0.2  0.0 - 1.0 mg/dL   Nitrite NEGATIVE  NEGATIVE   Leukocytes, UA NEGATIVE  NEGATIVE     ASSESSMENT 1. Gastroenteritis, acute     PLAN D5LR x1000 ml and LR x1000 ml in MAU Phenergan 25 mg IV and Zofran 8 mg ODT in MAU Pt reported feeling uncomfortable skin sensation, without hives or itching, after Phenergan IV.  She reports she has taken Phenergan PO before without incident.  Benadryl 25 mg IV given and symptoms resolved prior to discharge.  Discharge home Zofran 4 mg PO Q 8 hours PRN Phenergan 25 mg PO Q 6 hours PRN Drink plenty of fluids F/U as scheduled in [facility] Return to MAU as needed    Medication List    ASK your doctor about these medications       albuterol  108 (90 BASE) MCG/ACT inhaler  Commonly known as:  PROVENTIL HFA;VENTOLIN HFA  Inhale 2 puffs into the lungs every 4 (four) hours  as needed for wheezing or shortness of breath.     albuterol 108 (90 BASE) MCG/ACT inhaler  Commonly known as:  PROVENTIL HFA;VENTOLIN HFA  Inhale 2 puffs into the lungs every 4 (four) hours as needed (cough).     calcium carbonate 500 MG chewable tablet  Commonly known as:  TUMS - dosed in mg elemental calcium  Chew 2 tablets by mouth 3 (three) times daily as needed for heartburn. For upset stomach     dextromethorphan 30 MG/5ML liquid  Commonly known as:  DELSYM  Take 60 mg by mouth as needed for cough.     famotidine 10 MG tablet  Commonly known as:  PEPCID  Take 10 mg by mouth 2 (two) times daily.     guaifenesin 400 MG Tabs tablet  Commonly known as:  HUMIBID E  Take 400 mg by mouth every 4 (four) hours.     hydrOXYzine 25 MG capsule  Commonly known as:  VISTARIL  Take 1 capsule (25 mg total) by mouth 3 (three) times daily as needed.     prenatal multivitamin Tabs tablet  Take 1 tablet by mouth daily at 12 noon.     promethazine 25 MG tablet  Commonly known as:  PHENERGAN  Take 1 tablet (25 mg total) by mouth every 6 (six) hours as needed for nausea or vomiting.     sodium chloride 0.65 % nasal spray  Commonly known as:  OCEAN  Place 1 spray into the nose as needed for congestion.         Sharen Counter Certified Nurse-Midwife 07/23/2013  3:56 AM

## 2013-07-23 NOTE — MAU Note (Signed)
Pt reports vomiting and diarrhea x 2 hours, thinks it may be food poisoning. States she is having sharp lower abd cramping

## 2013-07-24 DIAGNOSIS — K5289 Other specified noninfective gastroenteritis and colitis: Secondary | ICD-10-CM

## 2013-07-24 MED ORDER — IBUPROFEN 600 MG PO TABS: 600.0000 mg | ORAL_TABLET | Freq: Four times a day (QID) | ORAL | Status: DC | PRN

## 2013-07-24 MED ORDER — CEPHALEXIN 500 MG PO CAPS: 500.0000 mg | ORAL_CAPSULE | Freq: Four times a day (QID) | ORAL | Status: AC

## 2013-07-24 MED ORDER — FLUCONAZOLE 150 MG PO TABS: 150.0000 mg | ORAL_TABLET | Freq: Every day | ORAL | Status: DC

## 2013-07-25 ENCOUNTER — Telehealth: Payer: Self-pay | Admitting: Medical

## 2013-07-25 NOTE — Telephone Encounter (Signed)
[name] had contacted the office of patient experience after her recent MAU visit. The patient was given Phenergan for acute gastroenteritis. She states that she looked up the medication when she left the hospital and was concerned because it was a Category C medication. I explained that this is a medication that we use often in pregnancy and that it would be ok for her to take it PRN for her symptoms. The patient also was not sure why she was given Diflucan. The patient was diagnosed with a UTI and given Rx for Keflex. She also states that she often gets yeast infections after taking antibiotics. I explained that the provider most likely sent that Rx for Diflucan in case the patient developed symptoms of a yeast infection after taking the Keflex. The patient voiced understanding and denied any additional questions or concerns at this time.   Freddi Starr, PA-C 07/25/2013 4:10 PM

## 2013-07-29 ENCOUNTER — Telehealth: Payer: Self-pay | Admitting: Advanced Practice Midwife

## 2013-07-29 DIAGNOSIS — K529 Noninfective gastroenteritis and colitis, unspecified: Secondary | ICD-10-CM

## 2013-07-29 DIAGNOSIS — O2342 Unspecified infection of urinary tract in pregnancy, second trimester: Secondary | ICD-10-CM

## 2013-07-29 NOTE — MAU Provider Note (Signed)
Attestation of Attending Supervision of Advanced Practitioner: Evaluation and management procedures were performed by the PA/NP/CNM/OB Fellow under my supervision/collaboration. Chart reviewed and agree with management and plan.  Victor Langenbach V 07/29/2013 6:29 AM

## 2013-07-29 NOTE — Telephone Encounter (Signed)
Pt called to request albuterol inhaler refill until her appointment with Dr Clearance Coots in 2 weeks.  Pt has albuterol listed as previous mediation but also has albuterol listed as an allergy which causes palpitations.  Did NOT sent medication to pharmacy at this time.  Attempted to call pt back to ask about allergy but phone number listed in Epic is work number and requires pt extension.  Unable to leave message.

## 2013-07-30 ENCOUNTER — Telehealth: Payer: Self-pay | Admitting: Medical

## 2013-07-30 DIAGNOSIS — J45909 Unspecified asthma, uncomplicated: Secondary | ICD-10-CM

## 2013-07-30 DIAGNOSIS — K529 Noninfective gastroenteritis and colitis, unspecified: Secondary | ICD-10-CM

## 2013-07-30 DIAGNOSIS — O2342 Unspecified infection of urinary tract in pregnancy, second trimester: Secondary | ICD-10-CM

## 2013-07-30 MED ORDER — ALBUTEROL SULFATE HFA 108 (90 BASE) MCG/ACT IN AERS: 2.0000 | INHALATION_SPRAY | Freq: Four times a day (QID) | RESPIRATORY_TRACT | Status: DC | PRN

## 2013-07-30 NOTE — Telephone Encounter (Signed)
Patient called MAU requesting Rx be sent to pharmacy for Albuterol inhaler. Note in Epic from Sharen Counter, CNM states that patient's chart noted allergy to albuterol even though it was an existing medication on her medication list. She had attempted to contact the patient by phone to confirm without success. Discussed reaction to albuterol with patient today. Patient states that "allergy" was tachycardia resulting from an hour long treatment in the past when she had a significant URI. Patient understands that this refill will be sent to her pharmacy today and any additional refills will need to come from Dr. Clearance Coots after prenatal care is established.   Freddi Starr, PA-C 07/30/2013 9:50 AM

## 2013-08-06 ENCOUNTER — Ambulatory Visit (INDEPENDENT_AMBULATORY_CARE_PROVIDER_SITE_OTHER): Payer: Medicaid Other | Admitting: Advanced Practice Midwife

## 2013-08-06 VITALS — BP 134/83 | Temp 97.2°F | Wt 311.2 lb

## 2013-08-06 DIAGNOSIS — O360120 Maternal care for anti-D [Rh] antibodies, second trimester, not applicable or unspecified: Secondary | ICD-10-CM

## 2013-08-06 DIAGNOSIS — O360990 Maternal care for other rhesus isoimmunization, unspecified trimester, not applicable or unspecified: Secondary | ICD-10-CM

## 2013-08-06 DIAGNOSIS — E669 Obesity, unspecified: Secondary | ICD-10-CM

## 2013-08-06 LAB — POCT URINALYSIS DIP (DEVICE)
Bilirubin Urine: NEGATIVE
Hgb urine dipstick: NEGATIVE
Ketones, ur: NEGATIVE mg/dL
Leukocytes, UA: NEGATIVE
Nitrite: NEGATIVE
Protein, ur: NEGATIVE mg/dL
pH: 5.5 (ref 5.0–8.0)

## 2013-08-06 NOTE — Progress Notes (Signed)
Doing well.  Denies vaginal bleeding, LOF, cramping/contractions. Constipation off an on, diet related, improves with increased fiber.  Discussed high fiber foods.  Discussed recommended weight gain in pregnancy.  Pt having trouble sleeping, Vistaril helps some. Discussed Benadryl or Vistaril PRN for sleep, good sleep hygiene.  Pt requests records to transfer care.  [facility] has Friday evening appts so her husband can attend.

## 2013-08-06 NOTE — Progress Notes (Signed)
Pulse-  95 

## 2013-08-11 ENCOUNTER — Ambulatory Visit (INDEPENDENT_AMBULATORY_CARE_PROVIDER_SITE_OTHER): Payer: Medicaid Other | Admitting: Obstetrics

## 2013-08-11 ENCOUNTER — Encounter: Payer: Self-pay | Admitting: Obstetrics

## 2013-08-11 VITALS — BP 124/80 | Temp 98.4°F | Wt 317.0 lb

## 2013-08-11 DIAGNOSIS — Z0000 Encounter for general adult medical examination without abnormal findings: Secondary | ICD-10-CM

## 2013-08-11 DIAGNOSIS — Z3201 Encounter for pregnancy test, result positive: Secondary | ICD-10-CM

## 2013-08-11 DIAGNOSIS — Z3402 Encounter for supervision of normal first pregnancy, second trimester: Secondary | ICD-10-CM

## 2013-08-11 MED ORDER — CITRANATAL HARMONY 27-1-250 MG PO CAPS: 1.0000 | ORAL_CAPSULE | Freq: Every day | ORAL | Status: DC

## 2013-08-11 NOTE — Progress Notes (Signed)
Pulse: 86   Subjective:    [name] is being seen today for her first obstetrical visit.  This is a planned pregnancy. She is at [redacted]w[redacted]d gestation. Her obstetrical history is significant for obesity. Relationship with FOB: significant other, living together. Patient does intend to breast feed. Pregnancy history fully reviewed.  Menstrual History: OB History   Grav Para Term Preterm Abortions TAB SAB Ect Mult Living   1               Last Pap 2014 WNL Menarche age: 1  Patient's last menstrual period was 03/13/2013.    The following portions of the patient's history were reviewed and updated as appropriate: allergies, current medications, past family history, past medical history, past social history, past surgical history and problem list.  Review of Systems Pertinent items are noted in HPI.    Objective:    General appearance: alert and no distress Abdomen: normal findings: soft, non-tender    Assessment:    Pregnancy at [redacted]w[redacted]d weeks    Plan:    Initial labs drawn. Prenatal vitamins.  Counseling provided regarding continued use of seat belts, cessation of alcohol consumption, smoking or use of illicit drugs; infection precautions i.e., influenza/TDAP immunizations, toxoplasmosis,CMV, parvovirus, listeria and varicella; workplace safety, exercise during pregnancy; routine dental care, safe medications, sexual activity, hot tubs, saunas, pools, travel, caffeine use, fish and methlymercury, potential toxins, hair treatments, varicose veins Weight gain recommendations per IOM guidelines reviewed: underweight/BMI< 18.5--> gain 28 - 40 lbs; normal weight/BMI 18.5 - 24.9--> gain 25 - 35 lbs; overweight/BMI 25 - 29.9--> gain 15 - 25 lbs; obese/BMI >30->gain  11 - 20 lbs Problem list reviewed and updated. FIRST/CF mutation testing/NIPT/QUAD SCREEN discussed: done. Role of ultrasound in pregnancy discussed; fetal survey: requested. Amniocentesis discussed: not indicated. VBAC  calculator score: VBAC consent form provided Follow up in 4 weeks. 50% of 20 min visit spent on counseling and coordination of care.

## 2013-08-20 ENCOUNTER — Ambulatory Visit (HOSPITAL_COMMUNITY): Payer: Medicaid Other

## 2013-08-22 ENCOUNTER — Ambulatory Visit (HOSPITAL_COMMUNITY): Admission: RE | Admit: 2013-08-22 | Payer: Medicaid Other | Source: Ambulatory Visit

## 2013-08-22 ENCOUNTER — Ambulatory Visit (HOSPITAL_COMMUNITY)
Admission: RE | Admit: 2013-08-22 | Discharge: 2013-08-22 | Disposition: A | Discharge status: Home / Self Care | Payer: Medicaid Other | Source: Ambulatory Visit | Attending: Advanced Practice Midwife | Admitting: Advanced Practice Midwife

## 2013-08-22 DIAGNOSIS — O99210 Obesity complicating pregnancy, unspecified trimester: Principal | ICD-10-CM

## 2013-08-22 DIAGNOSIS — E669 Obesity, unspecified: Secondary | ICD-10-CM | POA: Insufficient documentation

## 2013-08-22 DIAGNOSIS — Z3689 Encounter for other specified antenatal screening: Secondary | ICD-10-CM | POA: Insufficient documentation

## 2013-08-22 DIAGNOSIS — Z3491 Encounter for supervision of normal pregnancy, unspecified, first trimester: Secondary | ICD-10-CM

## 2013-09-10 ENCOUNTER — Ambulatory Visit (INDEPENDENT_AMBULATORY_CARE_PROVIDER_SITE_OTHER): Payer: Medicaid Other | Admitting: Obstetrics

## 2013-09-10 VITALS — BP 108/74 | Temp 97.9°F | Wt 306.0 lb

## 2013-09-10 DIAGNOSIS — D649 Anemia, unspecified: Secondary | ICD-10-CM | POA: Insufficient documentation

## 2013-09-10 DIAGNOSIS — Z3400 Encounter for supervision of normal first pregnancy, unspecified trimester: Secondary | ICD-10-CM

## 2013-09-10 NOTE — Progress Notes (Signed)
Pulse: 84 Patient states she is having so lower back pain. Patient states she has maybe felt the baby move once or twice but isn't sure. Patient needs a dental consent form for dentist tomorrow. Patient states she is hearing her pulse in her ears when she is quiet or trying to sleep. Patient has skin tag on back of her neck that has changed recently. Patient would like to discuss when she would need to go on maternity leave because work is asking. Patient would like to know if she should be worried about her weight continuing to decrease.

## 2013-09-22 ENCOUNTER — Encounter: Payer: Medicaid Other | Admitting: Obstetrics

## 2013-09-24 ENCOUNTER — Other Ambulatory Visit: Payer: Medicaid Other

## 2013-09-24 ENCOUNTER — Encounter: Payer: Self-pay | Admitting: Obstetrics

## 2013-09-24 ENCOUNTER — Ambulatory Visit (INDEPENDENT_AMBULATORY_CARE_PROVIDER_SITE_OTHER): Payer: Medicaid Other | Admitting: Obstetrics

## 2013-09-24 VITALS — BP 113/73 | Temp 98.1°F | Wt 310.0 lb

## 2013-09-24 DIAGNOSIS — B49 Unspecified mycosis: Secondary | ICD-10-CM

## 2013-09-24 DIAGNOSIS — Z3400 Encounter for supervision of normal first pregnancy, unspecified trimester: Secondary | ICD-10-CM

## 2013-09-24 LAB — POCT URINALYSIS DIPSTICK
BILIRUBIN UA: NEGATIVE
Blood, UA: NEGATIVE
Glucose, UA: NEGATIVE
KETONES UA: NEGATIVE
LEUKOCYTES UA: NEGATIVE
Nitrite, UA: NEGATIVE
PH UA: 5
Protein, UA: NEGATIVE
Urobilinogen, UA: NEGATIVE

## 2013-09-24 LAB — CBC
HCT: 40.5 % (ref 36.0–46.0)
Hemoglobin: 13.7 g/dL (ref 12.0–15.0)
MCH: 30.4 pg (ref 26.0–34.0)
MCHC: 33.8 g/dL (ref 30.0–36.0)
MCV: 89.8 fL (ref 78.0–100.0)
PLATELETS: 204 10*3/uL (ref 150–400)
RBC: 4.51 MIL/uL (ref 3.87–5.11)
RDW: 13.1 % (ref 11.5–15.5)
WBC: 12.1 10*3/uL — ABNORMAL HIGH (ref 4.0–10.5)

## 2013-09-24 MED ORDER — CLOTRIMAZOLE 1 % EX CREA: 1.0000 "application " | TOPICAL_CREAM | Freq: Two times a day (BID) | CUTANEOUS | Status: DC

## 2013-09-24 NOTE — Progress Notes (Signed)
Pulse: 91 Patient states she has a rash around her pelvic area and legs. Patient states that it is very itchy and has been itching since Saturday. Patient states it may be an allergic reaction to soap. Patient states she gets dizzy very easily. Patient states its usually if she gets up to fast or if she stares up at like the light. Patient would like to know how long we would let her go past her due date before inducing her if she did go past her due date.

## 2013-09-25 LAB — GLUCOSE TOLERANCE, 2 HOURS W/ 1HR
Glucose, 1 hour: 118 mg/dL (ref 70–170)
Glucose, 2 hour: 109 mg/dL (ref 70–139)
Glucose, Fasting: 66 mg/dL — ABNORMAL LOW (ref 70–99)

## 2013-09-25 LAB — RPR

## 2013-09-25 LAB — HIV ANTIBODY (ROUTINE TESTING W REFLEX): HIV: NONREACTIVE

## 2013-10-08 ENCOUNTER — Ambulatory Visit (INDEPENDENT_AMBULATORY_CARE_PROVIDER_SITE_OTHER): Payer: Medicaid Other | Admitting: Obstetrics

## 2013-10-08 ENCOUNTER — Encounter: Payer: Self-pay | Admitting: Obstetrics

## 2013-10-08 VITALS — BP 120/74 | Temp 99.0°F | Wt 306.0 lb

## 2013-10-08 DIAGNOSIS — Z3400 Encounter for supervision of normal first pregnancy, unspecified trimester: Secondary | ICD-10-CM

## 2013-10-08 LAB — POCT URINALYSIS DIPSTICK
Bilirubin, UA: NEGATIVE
Blood, UA: NEGATIVE
Glucose, UA: NEGATIVE
Ketones, UA: NEGATIVE
LEUKOCYTES UA: NEGATIVE
NITRITE UA: NEGATIVE
PH UA: 5
PROTEIN UA: NEGATIVE
Spec Grav, UA: >=1.03
UROBILINOGEN UA: NEGATIVE

## 2013-10-08 NOTE — Progress Notes (Signed)
Pulse- 96 

## 2013-10-14 ENCOUNTER — Inpatient Hospital Stay (HOSPITAL_COMMUNITY)
Admission: AD | Admit: 2013-10-14 | Discharge: 2013-10-14 | Disposition: A | Discharge status: Home / Self Care | Payer: Medicaid Other | Source: Ambulatory Visit | Attending: Obstetrics | Admitting: Obstetrics

## 2013-10-14 ENCOUNTER — Encounter (HOSPITAL_COMMUNITY): Payer: Self-pay | Admitting: *Deleted

## 2013-10-14 DIAGNOSIS — O99330 Smoking (tobacco) complicating pregnancy, unspecified trimester: Secondary | ICD-10-CM | POA: Insufficient documentation

## 2013-10-14 DIAGNOSIS — O368120 Decreased fetal movements, second trimester, not applicable or unspecified: Secondary | ICD-10-CM

## 2013-10-14 DIAGNOSIS — O368190 Decreased fetal movements, unspecified trimester, not applicable or unspecified: Secondary | ICD-10-CM | POA: Insufficient documentation

## 2013-10-14 NOTE — MAU Provider Note (Signed)
Chief Complaint:  Decreased Fetal Movement   First Provider Initiated Contact with Patient 10/14/2013 at 0155.  HPI: [name] is a 23 y.o. G1P0 at 3034w3d who presents to maternity admissions reporting decreased fetal movement x3 days. Had been consistently feeling good before that. No improvement after drinking lots of water and lying on her side. Denies contractions, leakage of fluid or vaginal bleeding.   Pregnancy Course: Uncomplicated  Past Medical History: Past Medical History  Diagnosis Date  . Endometriosis   . Asthma   . PCOS (polycystic ovarian syndrome)   . Animal bite of finger     a cat  . Bronchitis     Past obstetric history: OB History  Gravida Para Term Preterm AB SAB TAB Ectopic Multiple Living  1             # Outcome Date GA Lbr Len/2nd Weight Sex Delivery Anes PTL Lv  1 CUR               Past Surgical History: Past Surgical History  Procedure Laterality Date  . Mouth surgery       Family History: Family History  Problem Relation Age of Onset  . Asthma Mother   . Diabetes Father   . Heart attack Maternal Grandfather   . Diabetes Paternal Grandmother   . Heart attack Paternal Grandfather     Social History: History  Substance Use Topics  . Smoking status: Current Some Day Smoker -- 0.25 packs/day  . Smokeless tobacco: Never Used  . Alcohol Use: No    Allergies:  Allergies  Allergen Reactions  . Amoxicillin     Needs probiotic. Causes yeast infections    Meds:  Prescriptions prior to admission  Medication Sig Dispense Refill  . albuterol (PROVENTIL HFA;VENTOLIN HFA) 108 (90 BASE) MCG/ACT inhaler Inhale 2 puffs into the lungs every 6 (six) hours as needed for wheezing or shortness of breath.  8.5 g  0  . clotrimazole (LOTRIMIN) 1 % cream Apply 1 application topically 2 (two) times daily.  60 g  2  . ondansetron (ZOFRAN) 4 MG tablet Take 1 tablet (4 mg total) by mouth every 8 (eight) hours as needed for nausea or vomiting.  20  tablet  0  . oxymetazoline (AFRIN) 0.05 % nasal spray Place 1 spray into both nostrils 2 (two) times daily.      . Prenat w/o A-FeCbn-DSS-FA-DHA (CITRANATAL HARMONY) 27-1-250 MG CAPS Take 1 capsule by mouth daily before breakfast.  30 capsule  11    ROS: Pertinent findings in history of present illness.  Physical Exam  Height 5' 8.5" (1.74 m), weight 140.252 kg (309 lb 3.2 oz), last menstrual period 03/13/2013. GENERAL: Well-developed, well-nourished, obese female in no acute distress.  HEENT: normocephalic HEART: normal rate RESP: normal effort ABDOMEN: Soft, non-tender, gravid appropriate for gestational age EXTREMITIES: Nontender, no edema NEURO: alert and oriented SPECULUM EXAM: Deferred    FHT:  Baseline 120, moderate variability, accelerations present, no decelerations Contractions: None   Labs: No results found for this or any previous visit (from the past 24 hour(s)).  Imaging:  No results found.  MAU Course: Patient feeling fetal movement while in MAU. NST reactive  Assessment: 1. Decreased fetal movement in pregnancy in second trimester, antepartum    Plan: Discharge home in stable condition. Preterm labor precautions and fetal kick counts   Follow-up Information   [facility]. (As scheduled or, As needed, If symptoms worsen)  Contact information:   [address] [place] [address] [phone]      Follow up with [facility] [place] [facility]. (As needed If symptoms worsen)    Contact information:   [address] [id number] [place] [address] [phone]        Medication List    ASK your doctor about these medications       albuterol 108 (90 BASE) MCG/ACT inhaler  Commonly known as:  PROVENTIL HFA;VENTOLIN HFA  Inhale 2 puffs into the lungs every 6 (six) hours as needed for wheezing or shortness of breath.     CITRANATAL HARMONY 27-1-250 MG  Caps  Take 1 capsule by mouth daily before breakfast.     clotrimazole 1 % cream  Commonly known as:  LOTRIMIN  Apply 1 application topically 2 (two) times daily.     ondansetron 4 MG tablet  Commonly known as:  ZOFRAN  Take 1 tablet (4 mg total) by mouth every 8 (eight) hours as needed for nausea or vomiting.     oxymetazoline 0.05 % nasal spray  Commonly known as:  AFRIN  Place 1 spray into both nostrils 2 (two) times daily.        Murray Hill, CNM 10/14/2013 2:04 AM

## 2013-10-14 NOTE — MAU Note (Addendum)
Pt states last felt regular movement Saturday night; felt some "flutters" this morning, and nothing since then. Denies LOF or vaginal bleeding, denies contractions/abdominal pain. States no complications with this pregnancy.

## 2013-10-14 NOTE — Discharge Instructions (Signed)
Fetal Movement Counts Patient Name: __________________________________________________ Patient Due Date: ____________________ Performing a fetal movement count is highly recommended in high-risk pregnancies, but it is good for every pregnant woman to do. Your caregiver may ask you to start counting fetal movements at 28 weeks of the pregnancy. Fetal movements often increase:  After eating a full meal.  After physical activity.  After eating or drinking something sweet or cold.  At rest. Pay attention to when you feel the baby is most active. This will help you notice a pattern of your baby's sleep and wake cycles and what factors contribute to an increase in fetal movement. It is important to perform a fetal movement count at the same time each day when your baby is normally most active.  HOW TO COUNT FETAL MOVEMENTS 1. Find a quiet and comfortable area to sit or lie down on your left side. Lying on your left side provides the best blood and oxygen circulation to your baby. 2. Write down the day and time on a sheet of paper or in a journal. 3. Start counting kicks, flutters, swishes, rolls, or jabs in a 2 hour period. You should feel at least 10 movements within 2 hours. 4. If you do not feel 10 movements in 2 hours, wait 2 3 hours and count again. Look for a change in the pattern or not enough counts in 2 hours. SEEK MEDICAL CARE IF:  You feel less than 10 counts in 2 hours, tried twice.  There is no movement in over an hour.  The pattern is changing or taking longer each day to reach 10 counts in 2 hours.  You feel the baby is not moving as he or she usually does. Date: ____________ Movements: ____________ Start time: ____________ Finish time: ____________  Date: ____________ Movements: ____________ Start time: ____________ Finish time: ____________ Date: ____________ Movements: ____________ Start time: ____________ Finish time: ____________ Date: ____________ Movements: ____________  Start time: ____________ Finish time: ____________ Date: ____________ Movements: ____________ Start time: ____________ Finish time: ____________ Date: ____________ Movements: ____________ Start time: ____________ Finish time: ____________ Date: ____________ Movements: ____________ Start time: ____________ Finish time: ____________ Date: ____________ Movements: ____________ Start time: ____________ Finish time: ____________  Date: ____________ Movements: ____________ Start time: ____________ Finish time: ____________ Date: ____________ Movements: ____________ Start time: ____________ Finish time: ____________ Date: ____________ Movements: ____________ Start time: ____________ Finish time: ____________ Date: ____________ Movements: ____________ Start time: ____________ Finish time: ____________ Date: ____________ Movements: ____________ Start time: ____________ Finish time: ____________ Date: ____________ Movements: ____________ Start time: ____________ Finish time: ____________ Date: ____________ Movements: ____________ Start time: ____________ Finish time: ____________  Date: ____________ Movements: ____________ Start time: ____________ Finish time: ____________ Date: ____________ Movements: ____________ Start time: ____________ Finish time: ____________ Date: ____________ Movements: ____________ Start time: ____________ Finish time: ____________ Date: ____________ Movements: ____________ Start time: ____________ Finish time: ____________ Date: ____________ Movements: ____________ Start time: ____________ Finish time: ____________ Date: ____________ Movements: ____________ Start time: ____________ Finish time: ____________ Date: ____________ Movements: ____________ Start time: ____________ Finish time: ____________  Date: ____________ Movements: ____________ Start time: ____________ Finish time: ____________ Date: ____________ Movements: ____________ Start time: ____________ Finish time:  ____________ Date: ____________ Movements: ____________ Start time: ____________ Finish time: ____________ Date: ____________ Movements: ____________ Start time: ____________ Finish time: ____________ Date: ____________ Movements: ____________ Start time: ____________ Finish time: ____________ Date: ____________ Movements: ____________ Start time: ____________ Finish time: ____________ Date: ____________ Movements: ____________ Start time: ____________ Finish time: ____________  Date: ____________ Movements: ____________ Start time: ____________ Finish   time: ____________ Date: ____________ Movements: ____________ Start time: ____________ Finish time: ____________ Date: ____________ Movements: ____________ Start time: ____________ Finish time: ____________ Date: ____________ Movements: ____________ Start time: ____________ Finish time: ____________ Date: ____________ Movements: ____________ Start time: ____________ Finish time: ____________ Date: ____________ Movements: ____________ Start time: ____________ Finish time: ____________ Date: ____________ Movements: ____________ Start time: ____________ Finish time: ____________  Date: ____________ Movements: ____________ Start time: ____________ Finish time: ____________ Date: ____________ Movements: ____________ Start time: ____________ Finish time: ____________ Date: ____________ Movements: ____________ Start time: ____________ Finish time: ____________ Date: ____________ Movements: ____________ Start time: ____________ Finish time: ____________ Date: ____________ Movements: ____________ Start time: ____________ Finish time: ____________ Date: ____________ Movements: ____________ Start time: ____________ Finish time: ____________ Date: ____________ Movements: ____________ Start time: ____________ Finish time: ____________  Date: ____________ Movements: ____________ Start time: ____________ Finish time: ____________ Date: ____________ Movements:  ____________ Start time: ____________ Finish time: ____________ Date: ____________ Movements: ____________ Start time: ____________ Finish time: ____________ Date: ____________ Movements: ____________ Start time: ____________ Finish time: ____________ Date: ____________ Movements: ____________ Start time: ____________ Finish time: ____________ Date: ____________ Movements: ____________ Start time: ____________ Finish time: ____________ Date: ____________ Movements: ____________ Start time: ____________ Finish time: ____________  Date: ____________ Movements: ____________ Start time: ____________ Finish time: ____________ Date: ____________ Movements: ____________ Start time: ____________ Finish time: ____________ Date: ____________ Movements: ____________ Start time: ____________ Finish time: ____________ Date: ____________ Movements: ____________ Start time: ____________ Finish time: ____________ Date: ____________ Movements: ____________ Start time: ____________ Finish time: ____________ Date: ____________ Movements: ____________ Start time: ____________ Finish time: ____________ Document Released: 09/06/2006 Document Revised: 07/24/2012 Document Reviewed: 06/03/2012 [employer] Patient Information 2014 [employer].  

## 2013-10-22 ENCOUNTER — Encounter: Payer: Medicaid Other | Admitting: Obstetrics

## 2013-10-23 ENCOUNTER — Encounter: Payer: Self-pay | Admitting: Obstetrics

## 2013-10-23 ENCOUNTER — Ambulatory Visit (INDEPENDENT_AMBULATORY_CARE_PROVIDER_SITE_OTHER): Payer: Medicaid Other | Admitting: Obstetrics

## 2013-10-23 VITALS — BP 111/77 | Temp 98.4°F | Wt 306.0 lb

## 2013-10-23 DIAGNOSIS — O360990 Maternal care for other rhesus isoimmunization, unspecified trimester, not applicable or unspecified: Secondary | ICD-10-CM

## 2013-10-23 DIAGNOSIS — Z3400 Encounter for supervision of normal first pregnancy, unspecified trimester: Secondary | ICD-10-CM

## 2013-10-23 DIAGNOSIS — Z6791 Unspecified blood type, Rh negative: Secondary | ICD-10-CM

## 2013-10-23 DIAGNOSIS — O26899 Other specified pregnancy related conditions, unspecified trimester: Secondary | ICD-10-CM

## 2013-10-23 DIAGNOSIS — O3660X0 Maternal care for excessive fetal growth, unspecified trimester, not applicable or unspecified: Secondary | ICD-10-CM

## 2013-10-23 LAB — POCT URINALYSIS DIPSTICK
Bilirubin, UA: NEGATIVE
Blood, UA: NEGATIVE
Glucose, UA: NEGATIVE
Ketones, UA: NEGATIVE
Leukocytes, UA: NEGATIVE
NITRITE UA: NEGATIVE
PH UA: 5
PROTEIN UA: NEGATIVE
Spec Grav, UA: 1.025
UROBILINOGEN UA: NEGATIVE

## 2013-10-23 MED ORDER — RHO D IMMUNE GLOBULIN 1500 UNIT/2ML IJ SOLN
300.0000 ug | Freq: Once | INTRAMUSCULAR | Status: AC
  Administered 2013-10-23: 300 ug via INTRAMUSCULAR

## 2013-10-23 NOTE — Progress Notes (Signed)
Pulse: 66

## 2013-10-30 ENCOUNTER — Ambulatory Visit (HOSPITAL_COMMUNITY)
Admission: RE | Admit: 2013-10-30 | Discharge: 2013-10-30 | Disposition: A | Discharge status: Home / Self Care | Payer: Medicaid Other | Source: Ambulatory Visit | Attending: Obstetrics | Admitting: Obstetrics

## 2013-10-30 DIAGNOSIS — Z3689 Encounter for other specified antenatal screening: Secondary | ICD-10-CM | POA: Insufficient documentation

## 2013-10-30 DIAGNOSIS — O99210 Obesity complicating pregnancy, unspecified trimester: Secondary | ICD-10-CM

## 2013-10-30 DIAGNOSIS — E669 Obesity, unspecified: Secondary | ICD-10-CM | POA: Insufficient documentation

## 2013-10-30 DIAGNOSIS — O3660X0 Maternal care for excessive fetal growth, unspecified trimester, not applicable or unspecified: Secondary | ICD-10-CM | POA: Insufficient documentation

## 2013-11-05 ENCOUNTER — Encounter: Payer: Medicaid Other | Admitting: Obstetrics

## 2013-11-12 ENCOUNTER — Encounter: Payer: Self-pay | Admitting: Obstetrics

## 2013-11-19 ENCOUNTER — Ambulatory Visit (INDEPENDENT_AMBULATORY_CARE_PROVIDER_SITE_OTHER): Payer: Medicaid Other | Admitting: Obstetrics

## 2013-11-19 VITALS — BP 120/79 | Temp 97.7°F | Wt 311.0 lb

## 2013-11-19 DIAGNOSIS — Z3400 Encounter for supervision of normal first pregnancy, unspecified trimester: Secondary | ICD-10-CM

## 2013-11-19 LAB — POCT URINALYSIS DIPSTICK
Blood, UA: NEGATIVE
GLUCOSE UA: NEGATIVE
KETONES UA: NEGATIVE
Nitrite, UA: NEGATIVE
PROTEIN UA: NEGATIVE
SPEC GRAV UA: 1.025
pH, UA: 5

## 2013-11-19 NOTE — Progress Notes (Signed)
Pulse:  Patient states she is having ligament pain in her lower abdomen. Patient states that 2 weeks ago she was having really intense contractions that last about 2 hours. Patient states they were 20 seconds long and 8 minutes apart. Patient states she drank 1 liter of water and laid down and they went away. Patient would like to know what excessive fetal growth affecting mother ment and would like a recommendation for pediatrician. Patient would like know Dr. Thomes LollingHarpers view on pain management. Patient states she had an allergic reaction yesterday. 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 Patient states she wiped her skin down with baby wipes and took benadryl and that the rash went away.

## 2013-11-24 ENCOUNTER — Encounter: Payer: Self-pay | Admitting: Obstetrics

## 2013-12-04 ENCOUNTER — Ambulatory Visit (INDEPENDENT_AMBULATORY_CARE_PROVIDER_SITE_OTHER): Payer: Medicaid Other | Admitting: Obstetrics

## 2013-12-04 VITALS — BP 120/80 | Temp 98.3°F | Wt 311.0 lb

## 2013-12-04 DIAGNOSIS — J302 Other seasonal allergic rhinitis: Secondary | ICD-10-CM | POA: Insufficient documentation

## 2013-12-04 DIAGNOSIS — Z3400 Encounter for supervision of normal first pregnancy, unspecified trimester: Secondary | ICD-10-CM

## 2013-12-04 DIAGNOSIS — J309 Allergic rhinitis, unspecified: Secondary | ICD-10-CM

## 2013-12-04 LAB — POCT URINALYSIS DIPSTICK
Bilirubin, UA: NEGATIVE
Blood, UA: NEGATIVE
GLUCOSE UA: NEGATIVE
Ketones, UA: NEGATIVE
LEUKOCYTES UA: NEGATIVE
NITRITE UA: NEGATIVE
Protein, UA: NEGATIVE
Spec Grav, UA: 1.025
UROBILINOGEN UA: NEGATIVE
pH, UA: 5

## 2013-12-04 MED ORDER — LORATADINE 10 MG PO TABS: 10.0000 mg | ORAL_TABLET | Freq: Every day | ORAL | Status: DC

## 2013-12-04 NOTE — Progress Notes (Signed)
Subjective:    Cassie MonksRebecca A Crider is a 23 y.o. female being seen today for her obstetrical visit. She is at 6977w5d gestation. Patient reports pressure. Fetal movement: normal.  Menstrual History: OB History   Grav Para Term Preterm Abortions TAB SAB Ect Mult Living   1                Patient's last menstrual period was 03/13/2013.    Past Medical History  Diagnosis Date  . Endometriosis   . Asthma   . PCOS (polycystic ovarian syndrome)   . Animal bite of finger     a cat  . Bronchitis     Past Surgical History  Procedure Laterality Date  . Mouth surgery       (Not in a hospital admission) Allergies  Allergen Reactions  . Amoxicillin     Needs probiotic. Causes yeast infections    History  Substance Use Topics  . Smoking status: Current Some Day Smoker -- 0.25 packs/day  . Smokeless tobacco: Never Used  . Alcohol Use: No    Family History  Problem Relation Age of Onset  . Asthma Mother   . Diabetes Father   . Heart attack Maternal Grandfather   . Diabetes Paternal Grandmother   . Heart attack Paternal Grandfather      Review of Systems Constitutional: negative for anorexia Gastrointestinal: negative for abdominal pain Genitourinary:negative for vaginal discharge Musculoskeletal:negative for back pain Behavioral/Psych: negative for depression and tobacco use  Respiratory:  Positive for seasonal allergies  Objective:    LMP 03/13/2013 FHT:  140 BPM  Uterine Size: Undeterminable due to morbid obesity  Presentation: Unknown     Orders Placed This Encounter  Procedures  . POCT urinalysis dipstick   Meds ordered this encounter  Medications  . loratadine (CLARITIN) 10 MG tablet    Sig: Take 1 tablet (10 mg total) by mouth daily.    Dispense:  30 tablet    Refill:  11    Assessment:    Pregnancy 34 and 5/7 weeks   Plan:    GA@-week labs reviewed, problem list updated Consent signed. GBS sent TDAP offered  Rhogam given for RH  negative Pediatrician: discussed. Infant feeding: plans to breastfeed. Maternity leave: not discussed. Cigarette smoking: smokes 0.25 PPD. No orders of the defined types were placed in this encounter.   Current Outpatient Prescriptions on File Prior to Visit  Medication Sig Dispense Refill  . oxymetazoline (AFRIN) 0.05 % nasal spray Place 1 spray into both nostrils 2 (two) times daily.      . Prenat w/o A-FeCbn-DSS-FA-DHA (CITRANATAL HARMONY) 27-1-250 MG CAPS Take 1 capsule by mouth daily before breakfast.  30 capsule  11  . albuterol (PROVENTIL HFA;VENTOLIN HFA) 108 (90 BASE) MCG/ACT inhaler Inhale 2 puffs into the lungs every 6 (six) hours as needed for wheezing or shortness of breath.  8.5 g  0  . clotrimazole (LOTRIMIN) 1 % cream Apply 1 application topically 2 (two) times daily.  60 g  2   No current facility-administered medications on file prior to visit.   Follow up in 2 Weeks.

## 2013-12-18 ENCOUNTER — Ambulatory Visit (INDEPENDENT_AMBULATORY_CARE_PROVIDER_SITE_OTHER): Payer: Medicaid Other | Admitting: Obstetrics

## 2013-12-18 ENCOUNTER — Encounter: Payer: Self-pay | Admitting: Obstetrics

## 2013-12-18 VITALS — BP 110/78 | HR 85 | Temp 98.8°F | Wt 308.0 lb

## 2013-12-18 DIAGNOSIS — Z3400 Encounter for supervision of normal first pregnancy, unspecified trimester: Secondary | ICD-10-CM

## 2013-12-18 LAB — POCT URINALYSIS DIPSTICK
Bilirubin, UA: NEGATIVE
Blood, UA: NEGATIVE
Glucose, UA: NEGATIVE
Ketones, UA: NEGATIVE
LEUKOCYTES UA: NEGATIVE
Nitrite, UA: NEGATIVE
PROTEIN UA: NEGATIVE
Spec Grav, UA: 1.02
UROBILINOGEN UA: NEGATIVE
pH, UA: 5

## 2013-12-18 LAB — OB RESULTS CONSOLE GBS: GBS: NEGATIVE

## 2013-12-18 NOTE — Progress Notes (Signed)
Subjective:    [name] is a 23 y.o. female being seen today for her obstetrical visit. She is at 2763w5d gestation. Patient reports occasional contractions and pressure. Fetal movement: normal.  Problem List Items Addressed This Visit   None    Visit Diagnoses   Supervision of normal first pregnancy    -  Primary    Relevant Orders       POCT urinalysis dipstick (Completed)      Patient Active Problem List   Diagnosis Date Noted  . Allergic rhinitis, seasonal 12/04/2013  . Tobacco use disorder complicating pregnancy, childbirth, or the puerperium, antepartum condition or complication 07/11/2013  . Rh negative state in antepartum period 06/13/2013  . Supervision of normal pregnancy in first trimester 06/11/2013  . Obesity 06/11/2013  . Smoker 06/11/2013   Objective:    BP 110/78  Pulse 85  Temp(Src) 98.8 F (37.1 C)  Wt 308 lb (139.708 kg)  LMP 03/13/2013 FHT:  150 BPM  Uterine Size: Unable to appreciate due to obesity.  Presentation: unsure     Assessment:    Pregnancy @ 5163w5d weeks   Plan:     labs reviewed, problem list updated Consent signed. GBS sent TDAP offered  Rhogam given for RH negative Pediatrician: discussed. Infant feeding: plans to breastfeed. Maternity leave: discussed. Cigarette smoking: smokes 0.25 PPD. Orders Placed This Encounter  Procedures  . POCT urinalysis dipstick   No orders of the defined types were placed in this encounter.   Follow up in 1 Week.

## 2013-12-18 NOTE — Addendum Note (Signed)
Addended by: Marya LandryFOSTER, Giovanna Kemmerer D on: 12/18/2013 02:27 PM   Modules accepted: Orders

## 2013-12-20 LAB — CULTURE, STREPTOCOCCUS GRP B W/SUSCEPT

## 2013-12-25 ENCOUNTER — Ambulatory Visit (INDEPENDENT_AMBULATORY_CARE_PROVIDER_SITE_OTHER): Payer: Medicaid Other | Admitting: Obstetrics

## 2013-12-25 VITALS — BP 129/81 | HR 79 | Temp 98.5°F | Wt 308.0 lb

## 2013-12-25 DIAGNOSIS — Z3400 Encounter for supervision of normal first pregnancy, unspecified trimester: Secondary | ICD-10-CM

## 2013-12-25 DIAGNOSIS — K219 Gastro-esophageal reflux disease without esophagitis: Secondary | ICD-10-CM

## 2013-12-25 LAB — POCT URINALYSIS DIPSTICK
GLUCOSE UA: NEGATIVE
Ketones, UA: NEGATIVE
Nitrite, UA: NEGATIVE
Protein, UA: NEGATIVE
RBC UA: NEGATIVE
SPEC GRAV UA: 1.02
pH, UA: 5

## 2013-12-25 MED ORDER — OMEPRAZOLE 20 MG PO CPDR
20.0000 mg | DELAYED_RELEASE_CAPSULE | Freq: Two times a day (BID) | ORAL | Status: DC
Start: 2013-12-25 — End: 2014-01-19

## 2014-01-01 ENCOUNTER — Ambulatory Visit (INDEPENDENT_AMBULATORY_CARE_PROVIDER_SITE_OTHER): Payer: Medicaid Other | Admitting: Obstetrics

## 2014-01-01 ENCOUNTER — Encounter: Payer: Medicaid Other | Admitting: Obstetrics

## 2014-01-01 VITALS — BP 138/88 | HR 83 | Temp 98.0°F | Wt 311.0 lb

## 2014-01-01 DIAGNOSIS — Z3400 Encounter for supervision of normal first pregnancy, unspecified trimester: Secondary | ICD-10-CM

## 2014-01-01 LAB — POCT URINALYSIS DIPSTICK
Blood, UA: NEGATIVE
GLUCOSE UA: NEGATIVE
KETONES UA: NEGATIVE
Nitrite, UA: NEGATIVE
Spec Grav, UA: 1.025
pH, UA: 5

## 2014-01-02 ENCOUNTER — Encounter: Payer: Self-pay | Admitting: Obstetrics

## 2014-01-02 NOTE — Progress Notes (Signed)
Subjective:    Cassie MonksRebecca A Crider is a 23 y.o. female being seen today for her obstetrical visit. She is at 7391w6d gestation. Patient reports no complaints. Fetal movement: normal.  Problem List Items Addressed This Visit   None    Visit Diagnoses   Supervision of normal first pregnancy    -  Primary    Relevant Orders       POCT urinalysis dipstick (Completed)      Patient Active Problem List   Diagnosis Date Noted  . Allergic rhinitis, seasonal 12/04/2013  . Tobacco use disorder complicating pregnancy, childbirth, or the puerperium, antepartum condition or complication 07/11/2013  . Rh negative state in antepartum period 06/13/2013  . Supervision of normal pregnancy in first trimester 06/11/2013  . Obesity 06/11/2013  . Smoker 06/11/2013    Objective:    BP 138/88  Pulse 83  Temp(Src) 98 F (36.7 C)  Wt 311 lb (141.069 kg)  LMP 03/13/2013 FHT: 150 BPM  Uterine Size: size equals dates  Presentations: cephalic  Pelvic Exam:              Dilation: 1cm       Effacement: 50%             Station:  -3    Consistency: medium            Position: posterior     Assessment:    Pregnancy @ 8291w6d weeks   Plan:   Plans for delivery: Vaginal anticipated; labs reviewed; problem list updated Counseling: Consent signed. Infant feeding: plans to breastfeed. Cigarette smoking: smokes 0.25 PPD. L&D discussion: symptoms of labor, discussed when to call, discussed what number to call, anesthetic/analgesic options reviewed and delivering clinician:  plans Physician. Postpartum supports and preparation: circumcision discussed and contraception plans discussed.  Follow up in 1 Week.

## 2014-01-06 ENCOUNTER — Encounter: Payer: Self-pay | Admitting: Obstetrics

## 2014-01-06 ENCOUNTER — Encounter: Payer: Self-pay | Admitting: *Deleted

## 2014-01-06 ENCOUNTER — Ambulatory Visit (INDEPENDENT_AMBULATORY_CARE_PROVIDER_SITE_OTHER): Payer: Medicaid Other | Admitting: Obstetrics

## 2014-01-06 VITALS — BP 119/80 | HR 99 | Temp 97.8°F | Wt 308.0 lb

## 2014-01-06 DIAGNOSIS — Z3400 Encounter for supervision of normal first pregnancy, unspecified trimester: Secondary | ICD-10-CM

## 2014-01-06 LAB — POCT URINALYSIS DIPSTICK
BILIRUBIN UA: NEGATIVE
Blood, UA: NEGATIVE
Glucose, UA: NEGATIVE
Ketones, UA: NEGATIVE
LEUKOCYTES UA: NEGATIVE
NITRITE UA: NEGATIVE
PH UA: 5
PROTEIN UA: NEGATIVE
Spec Grav, UA: >=1.03
Urobilinogen, UA: NEGATIVE

## 2014-01-06 NOTE — Progress Notes (Signed)
Subjective:    [name] is a 23 y.o. female being seen today for her obstetrical visit. She is at 8848w3d gestation. Patient reports occasional contractions. Fetal movement: normal.  Problem List Items Addressed This Visit   None     Patient Active Problem List   Diagnosis Date Noted  . Allergic rhinitis, seasonal 12/04/2013  . Tobacco use disorder complicating pregnancy, childbirth, or the puerperium, antepartum condition or complication 07/11/2013  . Rh negative state in antepartum period 06/13/2013  . Supervision of normal pregnancy in first trimester 06/11/2013  . Obesity 06/11/2013  . Smoker 06/11/2013    Objective:    BP 119/80  Pulse 99  Temp(Src) 97.8 F (36.6 C)  Wt 308 lb (139.708 kg)  LMP 03/13/2013 FHT: 150 BPM  Uterine Size: size equals dates  Presentations: cephalic  Pelvic Exam:              Dilation: 1cm       Effacement: 50%             Station:  -3    Consistency: soft            Position: posterior     Assessment:    Pregnancy @ 5848w3d weeks   Plan:   Plans for delivery: Vaginal anticipated; labs reviewed; problem list updated Counseling: Consent signed. Infant feeding: plans to breastfeed. Cigarette smoking: smokes 0.25 PPD. L&D discussion: symptoms of labor, discussed when to call, discussed what number to call, anesthetic/analgesic options reviewed and delivering clinician:  plans Physician. Postpartum supports and preparation: circumcision discussed and contraception plans discussed.  Follow up in 1 Week.

## 2014-01-12 ENCOUNTER — Inpatient Hospital Stay (HOSPITAL_COMMUNITY)
Admission: AD | Admit: 2014-01-12 | Discharge: 2014-01-12 | Disposition: A | Discharge status: Home / Self Care | Payer: Medicaid Other | Source: Ambulatory Visit | Attending: Obstetrics | Admitting: Obstetrics

## 2014-01-12 ENCOUNTER — Inpatient Hospital Stay (HOSPITAL_COMMUNITY): Payer: Medicaid Other

## 2014-01-12 ENCOUNTER — Encounter (HOSPITAL_COMMUNITY): Payer: Self-pay | Admitting: *Deleted

## 2014-01-12 DIAGNOSIS — O479 False labor, unspecified: Secondary | ICD-10-CM | POA: Insufficient documentation

## 2014-01-12 LAB — POCT FERN TEST: POCT Fern Test: NEGATIVE

## 2014-01-12 NOTE — MAU Note (Signed)
States she has had UC's for a week that stopped yesterday. Now concerned with wetness on perineum.

## 2014-01-12 NOTE — Discharge Instructions (Signed)

## 2014-01-13 ENCOUNTER — Ambulatory Visit (INDEPENDENT_AMBULATORY_CARE_PROVIDER_SITE_OTHER): Payer: Medicaid Other | Admitting: Obstetrics

## 2014-01-13 ENCOUNTER — Encounter: Payer: Self-pay | Admitting: Obstetrics

## 2014-01-13 VITALS — BP 114/81 | HR 99 | Temp 98.3°F | Wt 310.0 lb

## 2014-01-13 DIAGNOSIS — Z3400 Encounter for supervision of normal first pregnancy, unspecified trimester: Secondary | ICD-10-CM

## 2014-01-13 NOTE — Progress Notes (Signed)
Subjective:    [name] is a 23 y.o. female being seen today for her obstetrical visit. She is at [redacted]w[redacted]d gestation. Patient reports no complaints. Fetal movement: normal.  Problem List Items Addressed This Visit   None    Visit Diagnoses   Supervision of normal first pregnancy    -  Primary    Relevant Orders       POCT urinalysis dipstick      Patient Active Problem List   Diagnosis Date Noted  . Allergic rhinitis, seasonal 12/04/2013  . Tobacco use disorder complicating pregnancy, childbirth, or the puerperium, antepartum condition or complication 07/11/2013  . Rh negative state in antepartum period 06/13/2013  . Supervision of normal pregnancy in first trimester 06/11/2013  . Obesity 06/11/2013  . Smoker 06/11/2013    Objective:    BP 114/81  Pulse 99  Temp(Src) 98.3 F (36.8 C)  Wt 310 lb (140.615 kg)  LMP 03/13/2013 FHT:  150 BPM  Uterine Size: Undeterminable  Presentation: cephalic  Pelvic Exam:              Dilation: 1cm       Effacement: 50%   Station:  -3     Consistency: medium            Position: posterior     Assessment:    Pregnancy @ [redacted]w[redacted]d  weeks   Plan:    Postdates management: discussed fetal surveillance and induction, discussed fetal movement, NST reactive, biophysical profile ordered. Induction: scheduled for 01-18-13, written information given.

## 2014-01-14 LAB — POCT URINALYSIS DIPSTICK
Bilirubin, UA: NEGATIVE
Glucose, UA: NEGATIVE
Ketones, UA: NEGATIVE
Leukocytes, UA: NEGATIVE
Nitrite, UA: NEGATIVE
PH UA: 5
PROTEIN UA: NEGATIVE
RBC UA: NEGATIVE
SPEC GRAV UA: 1.02
Urobilinogen, UA: NEGATIVE

## 2014-01-18 ENCOUNTER — Encounter (HOSPITAL_COMMUNITY): Payer: Self-pay | Admitting: *Deleted

## 2014-01-18 ENCOUNTER — Inpatient Hospital Stay (HOSPITAL_COMMUNITY)
Admission: RE | Admit: 2014-01-18 | Discharge: 2014-01-23 | DRG: 765 | Disposition: A | Discharge status: Home / Self Care | Payer: Medicaid Other | Source: Ambulatory Visit | Attending: Obstetrics | Admitting: Obstetrics

## 2014-01-18 DIAGNOSIS — E282 Polycystic ovarian syndrome: Secondary | ICD-10-CM | POA: Diagnosis present

## 2014-01-18 DIAGNOSIS — Z6841 Body Mass Index (BMI) 40.0 and over, adult: Secondary | ICD-10-CM

## 2014-01-18 DIAGNOSIS — Z3490 Encounter for supervision of normal pregnancy, unspecified, unspecified trimester: Secondary | ICD-10-CM

## 2014-01-18 DIAGNOSIS — O99334 Smoking (tobacco) complicating childbirth: Secondary | ICD-10-CM | POA: Diagnosis present

## 2014-01-18 DIAGNOSIS — Z833 Family history of diabetes mellitus: Secondary | ICD-10-CM

## 2014-01-18 DIAGNOSIS — Z8249 Family history of ischemic heart disease and other diseases of the circulatory system: Secondary | ICD-10-CM

## 2014-01-18 DIAGNOSIS — E669 Obesity, unspecified: Secondary | ICD-10-CM | POA: Diagnosis present

## 2014-01-18 DIAGNOSIS — O99214 Obesity complicating childbirth: Secondary | ICD-10-CM

## 2014-01-18 DIAGNOSIS — O99330 Smoking (tobacco) complicating pregnancy, unspecified trimester: Secondary | ICD-10-CM

## 2014-01-18 DIAGNOSIS — O8612 Endometritis following delivery: Secondary | ICD-10-CM | POA: Diagnosis not present

## 2014-01-18 DIAGNOSIS — Z98891 History of uterine scar from previous surgery: Secondary | ICD-10-CM

## 2014-01-18 DIAGNOSIS — O480 Post-term pregnancy: Principal | ICD-10-CM | POA: Diagnosis present

## 2014-01-18 DIAGNOSIS — O411090 Infection of amniotic sac and membranes, unspecified, unspecified trimester, not applicable or unspecified: Secondary | ICD-10-CM | POA: Diagnosis present

## 2014-01-18 LAB — CBC
HEMATOCRIT: 39.4 % (ref 36.0–46.0)
Hemoglobin: 14.2 g/dL (ref 12.0–15.0)
MCH: 32.1 pg (ref 26.0–34.0)
MCHC: 36 g/dL (ref 30.0–36.0)
MCV: 88.9 fL (ref 78.0–100.0)
PLATELETS: 180 10*3/uL (ref 150–400)
RBC: 4.43 MIL/uL (ref 3.87–5.11)
RDW: 12.6 % (ref 11.5–15.5)
WBC: 9.5 10*3/uL (ref 4.0–10.5)

## 2014-01-18 MED ORDER — ZOLPIDEM TARTRATE 5 MG PO TABS
5.0000 mg | ORAL_TABLET | Freq: Every evening | ORAL | Status: DC | PRN
  Administered 2014-01-18: 5 mg via ORAL
  Filled 2014-01-18: qty 1

## 2014-01-18 MED ORDER — CITRIC ACID-SODIUM CITRATE 334-500 MG/5ML PO SOLN
30.0000 mL | ORAL | Status: DC | PRN
  Administered 2014-01-20: 30 mL via ORAL
  Filled 2014-01-18: qty 15

## 2014-01-18 MED ORDER — LIDOCAINE HCL (PF) 1 % IJ SOLN: 30.0000 mL | INTRAMUSCULAR | Status: DC | PRN

## 2014-01-18 MED ORDER — PROMETHAZINE HCL 25 MG/ML IJ SOLN
25.0000 mg | Freq: Four times a day (QID) | INTRAMUSCULAR | Status: DC | PRN
  Administered 2014-01-19: 25 mg via INTRAMUSCULAR
  Filled 2014-01-18: qty 1

## 2014-01-18 MED ORDER — IBUPROFEN 600 MG PO TABS: 600.0000 mg | ORAL_TABLET | Freq: Four times a day (QID) | ORAL | Status: DC | PRN

## 2014-01-18 MED ORDER — FLEET ENEMA 7-19 GM/118ML RE ENEM: 1.0000 | ENEMA | RECTAL | Status: DC | PRN

## 2014-01-18 MED ORDER — MISOPROSTOL 25 MCG QUARTER TABLET
25.0000 ug | ORAL_TABLET | ORAL | Status: DC | PRN
  Administered 2014-01-18 – 2014-01-19 (×2): 25 ug via VAGINAL
  Filled 2014-01-18 (×2): qty 0.25

## 2014-01-18 MED ORDER — ONDANSETRON HCL 4 MG/2ML IJ SOLN: 4.0000 mg | Freq: Four times a day (QID) | INTRAMUSCULAR | Status: DC | PRN

## 2014-01-18 MED ORDER — LACTATED RINGERS IV SOLN
INTRAVENOUS | Status: DC
  Administered 2014-01-18 – 2014-01-20 (×7): via INTRAVENOUS

## 2014-01-18 MED ORDER — LACTATED RINGERS IV SOLN
500.0000 mL | INTRAVENOUS | Status: DC | PRN
  Administered 2014-01-20: 500 mL via INTRAVENOUS

## 2014-01-18 MED ORDER — ACETAMINOPHEN 325 MG PO TABS: 650.0000 mg | ORAL_TABLET | ORAL | Status: DC | PRN

## 2014-01-18 MED ORDER — OXYTOCIN 40 UNITS IN LACTATED RINGERS INFUSION - SIMPLE MED: 62.5000 mL/h | INTRAVENOUS | Status: DC

## 2014-01-18 MED ORDER — OXYCODONE-ACETAMINOPHEN 5-325 MG PO TABS: 1.0000 | ORAL_TABLET | ORAL | Status: DC | PRN

## 2014-01-18 MED ORDER — NALBUPHINE HCL 10 MG/ML IJ SOLN
10.0000 mg | Freq: Four times a day (QID) | INTRAMUSCULAR | Status: DC | PRN
  Administered 2014-01-19: 10 mg via INTRAMUSCULAR
  Filled 2014-01-18: qty 1

## 2014-01-18 MED ORDER — TERBUTALINE SULFATE 1 MG/ML IJ SOLN: 0.2500 mg | Freq: Once | INTRAMUSCULAR | Status: AC | PRN

## 2014-01-18 MED ORDER — OXYTOCIN BOLUS FROM INFUSION: 500.0000 mL | INTRAVENOUS | Status: DC

## 2014-01-18 MED ORDER — NALBUPHINE HCL 10 MG/ML IJ SOLN
10.0000 mg | INTRAMUSCULAR | Status: DC | PRN
  Administered 2014-01-19 (×2): 10 mg via INTRAVENOUS
  Filled 2014-01-18 (×2): qty 1

## 2014-01-19 LAB — TYPE AND SCREEN
ABO/RH(D): O NEG
Antibody Screen: NEGATIVE

## 2014-01-19 LAB — ABO/RH: ABO/RH(D): O NEG

## 2014-01-19 LAB — RPR

## 2014-01-19 MED ORDER — TERBUTALINE SULFATE 1 MG/ML IJ SOLN: 0.2500 mg | Freq: Once | INTRAMUSCULAR | Status: AC | PRN

## 2014-01-19 MED ORDER — OXYTOCIN 40 UNITS IN LACTATED RINGERS INFUSION - SIMPLE MED
1.0000 m[IU]/min | INTRAVENOUS | Status: DC
  Filled 2014-01-19: qty 1000

## 2014-01-19 MED ORDER — PHENYLEPHRINE 40 MCG/ML (10ML) SYRINGE FOR IV PUSH (FOR BLOOD PRESSURE SUPPORT): 80.0000 ug | PREFILLED_SYRINGE | INTRAVENOUS | Status: DC | PRN

## 2014-01-19 MED ORDER — FENTANYL 2.5 MCG/ML BUPIVACAINE 1/10 % EPIDURAL INFUSION (WH - ANES)
14.0000 mL/h | INTRAMUSCULAR | Status: DC | PRN
  Filled 2014-01-19: qty 125

## 2014-01-19 MED ORDER — DIPHENHYDRAMINE HCL 50 MG/ML IJ SOLN: 12.5000 mg | INTRAMUSCULAR | Status: DC | PRN

## 2014-01-19 MED ORDER — PHENYLEPHRINE 40 MCG/ML (10ML) SYRINGE FOR IV PUSH (FOR BLOOD PRESSURE SUPPORT)
80.0000 ug | PREFILLED_SYRINGE | INTRAVENOUS | Status: DC | PRN
  Filled 2014-01-19: qty 10

## 2014-01-19 MED ORDER — OXYTOCIN 40 UNITS IN LACTATED RINGERS INFUSION - SIMPLE MED
1.0000 m[IU]/min | INTRAVENOUS | Status: DC
  Administered 2014-01-19: 2 m[IU]/min via INTRAVENOUS

## 2014-01-19 MED ORDER — EPHEDRINE 5 MG/ML INJ
10.0000 mg | INTRAVENOUS | Status: DC | PRN
  Filled 2014-01-19: qty 4

## 2014-01-19 MED ORDER — LACTATED RINGERS IV SOLN: 500.0000 mL | Freq: Once | INTRAVENOUS | Status: DC

## 2014-01-19 MED ORDER — EPHEDRINE 5 MG/ML INJ: 10.0000 mg | INTRAVENOUS | Status: DC | PRN

## 2014-01-19 NOTE — Progress Notes (Signed)
[name] is a 23 y.o. G1P0 at [redacted]w[redacted]d by LMP admitted for induction of labor due to Post dates.   Subjective: Comfortable  Objective: BP 90/63  Pulse 71  Temp(Src) 98.2 F (36.8 C) (Oral)  Resp 20  Ht 5\' 8"  (1.727 m)  Wt 141.069 kg (311 lb)  BMI 47.30 kg/m2  LMP 03/13/2013       FHT:  Limited FHT--difficult to monitor UC:   irregular SVE:   Dilation: 1 Effacement (%): 30 Station: -2 Exam by:: Dr. Tamela Oddi AROM-->clear fluid; unable to place internal monitors Labs: Lab Results  Component Value Date   WBC 9.5 01/18/2014   HGB 14.2 01/18/2014   HCT 39.4 01/18/2014   MCV 88.9 01/18/2014   PLT 180 01/18/2014    Assessment / Plan: Prodromal labor  Labor: see above; start low dose Pitocin if able to monitor the fetus externally Preeclampsia:  n/a Fetal Wellbeing:  Category I Pain Control:  Labor support without medications I/D:  n/a Anticipated MOD:  NSVD  Antionette Char 01/19/2014, 11:08 AM

## 2014-01-19 NOTE — H&P (Signed)
[name] is a 23 y.o. female presenting for IOL for postdates. Maternal Medical History:  Fetal activity: Perceived fetal activity is normal.   Last perceived fetal movement was within the past hour.    Prenatal complications: no prenatal complications Prenatal Complications - Diabetes: none.    OB History   Grav Para Term Preterm Abortions TAB SAB Ect Mult Living   1              Past Medical History  Diagnosis Date  . Endometriosis   . Asthma   . PCOS (polycystic ovarian syndrome)   . Animal bite of finger     a cat  . Bronchitis    Past Surgical History  Procedure Laterality Date  . Mouth surgery    . Wisdom tooth extraction     Family History: family history includes Asthma in her mother; Diabetes in her father and paternal grandmother; Heart attack in her maternal grandfather and paternal grandfather. Social History:  reports that she has been smoking Cigarettes.  She has been smoking about 0.25 packs per day. She has never used smokeless tobacco. She reports that she does not drink alcohol or use illicit drugs.   Prenatal Transfer Tool  Maternal Diabetes: No Genetic Screening: Normal Maternal Ultrasounds/Referrals: Normal Fetal Ultrasounds or other Referrals:  None Maternal Substance Abuse:  No Significant Maternal Medications:  None Significant Maternal Lab Results:  None Other Comments:  None  Review of Systems  All other systems reviewed and are negative.   Dilation: 1 Effacement (%): 30 Station: -2 Exam by:: Bertram Millard, RN Blood pressure 114/72, pulse 80, temperature 98.3 F (36.8 C), temperature source Oral, resp. rate 18, height 5\' 8"  (1.727 m), weight 311 lb (141.069 kg), last menstrual period 03/13/2013. Maternal Exam:  Abdomen: Patient reports no abdominal tenderness. Fetal presentation: vertex  Introitus: Normal vulva. Normal vagina.  Pelvis: adequate for delivery.   Cervix: Cervix evaluated by digital exam.     Physical Exam  Nursing  note and vitals reviewed. Constitutional: She is oriented to person, place, and time. She appears well-developed and well-nourished.  HENT:  Head: Normocephalic and atraumatic.  Eyes: Conjunctivae are normal. Pupils are equal, round, and reactive to light.  Neck: Normal range of motion. Neck supple.  Cardiovascular: Normal rate and regular rhythm.   Respiratory: Effort normal.  GI: Soft.  Genitourinary: Vagina normal and uterus normal.  Musculoskeletal: Normal range of motion.  Neurological: She is alert and oriented to person, place, and time.  Skin: Skin is warm and dry.  Psychiatric: She has a normal mood and affect. Her behavior is normal. Judgment and thought content normal.    Prenatal labs: ABO, Rh: --/--/O NEG (05/31 2230) Antibody: NEG (05/30 2230) Rubella: 2.97 (10/22 1425) RPR: NON REAC (02/04 1324)  HBsAg: NEGATIVE (10/22 1425)  HIV: NON REACTIVE (02/04 1324)  GBS: Negative (04/30 0000)   Assessment/Plan: 41 weeks.  2 stage IOL.   Brock Bad 01/19/2014, 6:33 AM

## 2014-01-20 ENCOUNTER — Inpatient Hospital Stay (HOSPITAL_COMMUNITY): Payer: Medicaid Other | Admitting: Anesthesiology

## 2014-01-20 ENCOUNTER — Encounter (HOSPITAL_COMMUNITY): Payer: Medicaid Other | Admitting: Anesthesiology

## 2014-01-20 ENCOUNTER — Encounter (HOSPITAL_COMMUNITY): Payer: Self-pay | Admitting: Anesthesiology

## 2014-01-20 ENCOUNTER — Encounter (HOSPITAL_COMMUNITY)
Admission: RE | Disposition: A | Discharge status: Home / Self Care | Payer: Self-pay | Source: Ambulatory Visit | Attending: Obstetrics

## 2014-01-20 DIAGNOSIS — Z98891 History of uterine scar from previous surgery: Secondary | ICD-10-CM

## 2014-01-20 SURGERY — Surgical Case
Anesthesia: Epidural

## 2014-01-20 MED ORDER — SIMETHICONE 80 MG PO CHEW
80.0000 mg | CHEWABLE_TABLET | Freq: Three times a day (TID) | ORAL | Status: DC
  Administered 2014-01-20 – 2014-01-23 (×8): 80 mg via ORAL
  Filled 2014-01-20 (×8): qty 1

## 2014-01-20 MED ORDER — METOCLOPRAMIDE HCL 5 MG/ML IJ SOLN: 10.0000 mg | Freq: Once | INTRAMUSCULAR | Status: DC | PRN

## 2014-01-20 MED ORDER — ZOLPIDEM TARTRATE 5 MG PO TABS: 5.0000 mg | ORAL_TABLET | Freq: Every evening | ORAL | Status: DC | PRN

## 2014-01-20 MED ORDER — ONDANSETRON HCL 4 MG/2ML IJ SOLN
INTRAMUSCULAR | Status: AC
  Filled 2014-01-20: qty 2

## 2014-01-20 MED ORDER — LIDOCAINE-EPINEPHRINE (PF) 2 %-1:200000 IJ SOLN
INTRAMUSCULAR | Status: AC
  Filled 2014-01-20: qty 20

## 2014-01-20 MED ORDER — PRENATAL MULTIVITAMIN CH
1.0000 | ORAL_TABLET | Freq: Every day | ORAL | Status: DC
  Administered 2014-01-21 – 2014-01-22 (×2): 1 via ORAL
  Filled 2014-01-20 (×2): qty 1

## 2014-01-20 MED ORDER — MEPERIDINE HCL 25 MG/ML IJ SOLN: 6.2500 mg | INTRAMUSCULAR | Status: DC | PRN

## 2014-01-20 MED ORDER — MORPHINE SULFATE (PF) 0.5 MG/ML IJ SOLN
INTRAMUSCULAR | Status: DC | PRN
  Administered 2014-01-20: 4 mg via EPIDURAL

## 2014-01-20 MED ORDER — DIPHENHYDRAMINE HCL 50 MG/ML IJ SOLN: 25.0000 mg | INTRAMUSCULAR | Status: DC | PRN

## 2014-01-20 MED ORDER — PHENYLEPHRINE 40 MCG/ML (10ML) SYRINGE FOR IV PUSH (FOR BLOOD PRESSURE SUPPORT)
PREFILLED_SYRINGE | INTRAVENOUS | Status: AC
  Filled 2014-01-20: qty 5

## 2014-01-20 MED ORDER — WITCH HAZEL-GLYCERIN EX PADS: 1.0000 "application " | MEDICATED_PAD | CUTANEOUS | Status: DC | PRN

## 2014-01-20 MED ORDER — NALBUPHINE HCL 10 MG/ML IJ SOLN: 5.0000 mg | INTRAMUSCULAR | Status: DC | PRN

## 2014-01-20 MED ORDER — SIMETHICONE 80 MG PO CHEW: 80.0000 mg | CHEWABLE_TABLET | ORAL | Status: DC | PRN

## 2014-01-20 MED ORDER — OXYTOCIN 10 UNIT/ML IJ SOLN
INTRAMUSCULAR | Status: AC
  Filled 2014-01-20: qty 4

## 2014-01-20 MED ORDER — SODIUM CHLORIDE 0.9 % IJ SOLN: 3.0000 mL | INTRAMUSCULAR | Status: DC | PRN

## 2014-01-20 MED ORDER — KETOROLAC TROMETHAMINE 30 MG/ML IJ SOLN: 30.0000 mg | Freq: Four times a day (QID) | INTRAMUSCULAR | Status: DC | PRN

## 2014-01-20 MED ORDER — CEFAZOLIN SODIUM-DEXTROSE 2-3 GM-% IV SOLR
INTRAVENOUS | Status: AC
  Filled 2014-01-20: qty 100

## 2014-01-20 MED ORDER — ONDANSETRON HCL 4 MG/2ML IJ SOLN
INTRAMUSCULAR | Status: DC | PRN
Start: 2014-01-20 — End: 2014-01-20
  Administered 2014-01-20: 4 mg via INTRAVENOUS

## 2014-01-20 MED ORDER — DIPHENHYDRAMINE HCL 50 MG/ML IJ SOLN: 12.5000 mg | INTRAMUSCULAR | Status: DC | PRN

## 2014-01-20 MED ORDER — IBUPROFEN 600 MG PO TABS
600.0000 mg | ORAL_TABLET | Freq: Four times a day (QID) | ORAL | Status: DC
  Administered 2014-01-20 – 2014-01-23 (×11): 600 mg via ORAL
  Filled 2014-01-20 (×11): qty 1

## 2014-01-20 MED ORDER — TETANUS-DIPHTH-ACELL PERTUSSIS 5-2.5-18.5 LF-MCG/0.5 IM SUSP: 0.5000 mL | Freq: Once | INTRAMUSCULAR | Status: DC

## 2014-01-20 MED ORDER — KETOROLAC TROMETHAMINE 30 MG/ML IJ SOLN
INTRAMUSCULAR | Status: AC
  Filled 2014-01-20: qty 1

## 2014-01-20 MED ORDER — DIBUCAINE 1 % RE OINT: 1.0000 "application " | TOPICAL_OINTMENT | RECTAL | Status: DC | PRN

## 2014-01-20 MED ORDER — SCOPOLAMINE 1 MG/3DAYS TD PT72
MEDICATED_PATCH | TRANSDERMAL | Status: AC
  Filled 2014-01-20: qty 1

## 2014-01-20 MED ORDER — LACTATED RINGERS IV SOLN
INTRAVENOUS | Status: DC
  Administered 2014-01-20: 20:00:00 via INTRAVENOUS

## 2014-01-20 MED ORDER — FENTANYL CITRATE 0.05 MG/ML IJ SOLN: 25.0000 ug | INTRAMUSCULAR | Status: DC | PRN

## 2014-01-20 MED ORDER — SCOPOLAMINE 1 MG/3DAYS TD PT72
1.0000 | MEDICATED_PATCH | Freq: Once | TRANSDERMAL | Status: DC
  Administered 2014-01-20: 1.5 mg via TRANSDERMAL

## 2014-01-20 MED ORDER — DIPHENHYDRAMINE HCL 25 MG PO CAPS
25.0000 mg | ORAL_CAPSULE | ORAL | Status: DC | PRN
  Filled 2014-01-20: qty 1

## 2014-01-20 MED ORDER — MORPHINE SULFATE 0.5 MG/ML IJ SOLN
INTRAMUSCULAR | Status: AC
  Filled 2014-01-20: qty 10

## 2014-01-20 MED ORDER — SIMETHICONE 80 MG PO CHEW
80.0000 mg | CHEWABLE_TABLET | ORAL | Status: DC
  Administered 2014-01-20 – 2014-01-23 (×4): 80 mg via ORAL
  Filled 2014-01-20 (×3): qty 1

## 2014-01-20 MED ORDER — OXYCODONE-ACETAMINOPHEN 5-325 MG PO TABS
1.0000 | ORAL_TABLET | ORAL | Status: DC | PRN
Start: 2014-01-20 — End: 2014-01-23
  Administered 2014-01-20: 2 via ORAL
  Administered 2014-01-21 (×3): 1 via ORAL
  Administered 2014-01-21 – 2014-01-22 (×6): 2 via ORAL
  Administered 2014-01-23: 1 via ORAL
  Administered 2014-01-23 (×2): 2 via ORAL
  Filled 2014-01-20 (×3): qty 2
  Filled 2014-01-20 (×2): qty 1
  Filled 2014-01-20 (×7): qty 2
  Filled 2014-01-20: qty 1

## 2014-01-20 MED ORDER — MORPHINE SULFATE (PF) 0.5 MG/ML IJ SOLN
INTRAMUSCULAR | Status: DC | PRN
  Administered 2014-01-20: 1 mg via INTRAVENOUS

## 2014-01-20 MED ORDER — OXYTOCIN 40 UNITS IN LACTATED RINGERS INFUSION - SIMPLE MED: 62.5000 mL/h | INTRAVENOUS | Status: AC

## 2014-01-20 MED ORDER — DIPHENHYDRAMINE HCL 25 MG PO CAPS
25.0000 mg | ORAL_CAPSULE | Freq: Four times a day (QID) | ORAL | Status: DC | PRN
  Administered 2014-01-20 – 2014-01-21 (×2): 25 mg via ORAL
  Filled 2014-01-20 (×2): qty 1

## 2014-01-20 MED ORDER — SODIUM BICARBONATE 8.4 % IV SOLN
INTRAVENOUS | Status: AC
  Filled 2014-01-20: qty 50

## 2014-01-20 MED ORDER — SENNOSIDES-DOCUSATE SODIUM 8.6-50 MG PO TABS
2.0000 | ORAL_TABLET | ORAL | Status: DC
Start: 2014-01-21 — End: 2014-01-23
  Administered 2014-01-20 – 2014-01-23 (×3): 2 via ORAL
  Filled 2014-01-20 (×3): qty 2

## 2014-01-20 MED ORDER — ONDANSETRON HCL 4 MG/2ML IJ SOLN: 4.0000 mg | INTRAMUSCULAR | Status: DC | PRN

## 2014-01-20 MED ORDER — LACTATED RINGERS IV SOLN
40.0000 [IU] | INTRAVENOUS | Status: DC | PRN
  Administered 2014-01-20: 40 [IU] via INTRAVENOUS

## 2014-01-20 MED ORDER — NALOXONE HCL 1 MG/ML IJ SOLN: 1.0000 ug/kg/h | INTRAVENOUS | Status: DC | PRN

## 2014-01-20 MED ORDER — PHENYLEPHRINE HCL 10 MG/ML IJ SOLN
INTRAMUSCULAR | Status: DC | PRN
  Administered 2014-01-20: 80 ug via INTRAVENOUS
  Administered 2014-01-20 (×2): 40 ug via INTRAVENOUS

## 2014-01-20 MED ORDER — ONDANSETRON HCL 4 MG/2ML IJ SOLN: 4.0000 mg | Freq: Three times a day (TID) | INTRAMUSCULAR | Status: DC | PRN

## 2014-01-20 MED ORDER — LANOLIN HYDROUS EX OINT: 1.0000 "application " | TOPICAL_OINTMENT | CUTANEOUS | Status: DC | PRN

## 2014-01-20 MED ORDER — METOCLOPRAMIDE HCL 5 MG/ML IJ SOLN
10.0000 mg | Freq: Three times a day (TID) | INTRAMUSCULAR | Status: DC | PRN
Start: 2014-01-20 — End: 2014-01-20

## 2014-01-20 MED ORDER — CEFAZOLIN SODIUM 10 G IJ SOLR
3.0000 g | INTRAMUSCULAR | Status: DC | PRN
  Administered 2014-01-20: 3 g via INTRAVENOUS

## 2014-01-20 MED ORDER — NALOXONE HCL 0.4 MG/ML IJ SOLN: 0.4000 mg | INTRAMUSCULAR | Status: DC | PRN

## 2014-01-20 MED ORDER — ONDANSETRON HCL 4 MG PO TABS: 4.0000 mg | ORAL_TABLET | ORAL | Status: DC | PRN

## 2014-01-20 MED ORDER — NALBUPHINE HCL 10 MG/ML IJ SOLN
10.0000 mg | INTRAMUSCULAR | Status: DC | PRN
  Administered 2014-01-20: 10 mg via INTRAVENOUS
  Filled 2014-01-20: qty 1

## 2014-01-20 MED ORDER — SODIUM BICARBONATE 8.4 % IV SOLN
INTRAVENOUS | Status: DC | PRN
  Administered 2014-01-20 (×3): 5 mL via EPIDURAL

## 2014-01-20 MED ORDER — FENTANYL 2.5 MCG/ML BUPIVACAINE 1/10 % EPIDURAL INFUSION (WH - ANES)
INTRAMUSCULAR | Status: DC | PRN
  Administered 2014-01-20: 14 mL/h via EPIDURAL

## 2014-01-20 MED ORDER — KETOROLAC TROMETHAMINE 30 MG/ML IJ SOLN
30.0000 mg | Freq: Four times a day (QID) | INTRAMUSCULAR | Status: DC | PRN
  Administered 2014-01-20: 30 mg via INTRAVENOUS

## 2014-01-20 MED ORDER — MENTHOL 3 MG MT LOZG: 1.0000 | LOZENGE | OROMUCOSAL | Status: DC | PRN

## 2014-01-20 MED ORDER — LIDOCAINE HCL (PF) 1 % IJ SOLN
INTRAMUSCULAR | Status: DC | PRN
  Administered 2014-01-20 (×2): 4 mL

## 2014-01-20 SURGICAL SUPPLY — 44 items
ADH SKN CLS APL DERMABOND .7 (GAUZE/BANDAGES/DRESSINGS) ×1
CANISTER WOUND CARE 500ML ATS (WOUND CARE) IMPLANT
CLAMP CORD UMBIL (MISCELLANEOUS) IMPLANT
CLOTH BEACON ORANGE TIMEOUT ST (SAFETY) ×3 IMPLANT
CONTAINER PREFILL 10% NBF 15ML (MISCELLANEOUS) ×6 IMPLANT
DERMABOND ADVANCED (GAUZE/BANDAGES/DRESSINGS) ×2
DERMABOND ADVANCED .7 DNX12 (GAUZE/BANDAGES/DRESSINGS) ×1 IMPLANT
DRAPE LG THREE QUARTER DISP (DRAPES) IMPLANT
DRESSING DISP NPWT PICO 4X12 (MISCELLANEOUS) ×3 IMPLANT
DRSG OPSITE POSTOP 4X10 (GAUZE/BANDAGES/DRESSINGS) ×3 IMPLANT
DRSG VAC ATS LRG SENSATRAC (GAUZE/BANDAGES/DRESSINGS) IMPLANT
DRSG VAC ATS MED SENSATRAC (GAUZE/BANDAGES/DRESSINGS) IMPLANT
DRSG VAC ATS SM SENSATRAC (GAUZE/BANDAGES/DRESSINGS) IMPLANT
DURAPREP 26ML APPLICATOR (WOUND CARE) ×3 IMPLANT
ELECT REM PT RETURN 9FT ADLT (ELECTROSURGICAL) ×3
ELECTRODE REM PT RTRN 9FT ADLT (ELECTROSURGICAL) ×1 IMPLANT
EXTRACTOR VACUUM M CUP 4 TUBE (SUCTIONS) IMPLANT
EXTRACTOR VACUUM M CUP 4' TUBE (SUCTIONS)
GLOVE BIO SURGEON STRL SZ8 (GLOVE) ×6 IMPLANT
GOWN STRL REUS W/TWL LRG LVL3 (GOWN DISPOSABLE) ×6 IMPLANT
KIT ABG SYR 3ML LUER SLIP (SYRINGE) IMPLANT
NDL HYPO 25X5/8 SAFETYGLIDE (NEEDLE) ×1 IMPLANT
NEEDLE HYPO 25X5/8 SAFETYGLIDE (NEEDLE) ×3 IMPLANT
NS IRRIG 1000ML POUR BTL (IV SOLUTION) ×3 IMPLANT
PACK C SECTION WH (CUSTOM PROCEDURE TRAY) ×3 IMPLANT
PAD OB MATERNITY 4.3X12.25 (PERSONAL CARE ITEMS) ×3 IMPLANT
RTRCTR C-SECT PINK 25CM LRG (MISCELLANEOUS) ×3 IMPLANT
STAPLER VISISTAT 35W (STAPLE) IMPLANT
SUT GUT PLAIN 0 CT-3 TAN 27 (SUTURE) IMPLANT
SUT MNCRL 0 VIOLET CTX 36 (SUTURE) ×3 IMPLANT
SUT MNCRL AB 4-0 PS2 18 (SUTURE) IMPLANT
SUT MON AB 2-0 CT1 27 (SUTURE) ×3 IMPLANT
SUT MON AB 3-0 SH 27 (SUTURE)
SUT MON AB 3-0 SH27 (SUTURE) IMPLANT
SUT MONOCRYL 0 CTX 36 (SUTURE) ×6
SUT PDS AB 0 CTX 60 (SUTURE) IMPLANT
SUT PLAIN 2 0 XLH (SUTURE) IMPLANT
SUT VIC AB 0 CTX 36 (SUTURE)
SUT VIC AB 0 CTX36XBRD ANBCTRL (SUTURE) IMPLANT
SUT VIC AB 2-0 CT1 27 (SUTURE)
SUT VIC AB 2-0 CT1 TAPERPNT 27 (SUTURE) IMPLANT
TOWEL OR 17X24 6PK STRL BLUE (TOWEL DISPOSABLE) ×3 IMPLANT
TRAY FOLEY CATH 14FR (SET/KITS/TRAYS/PACK) ×3 IMPLANT
WATER STERILE IRR 1000ML POUR (IV SOLUTION) ×3 IMPLANT

## 2014-01-20 NOTE — Progress Notes (Signed)
Patient ID: RICKIA VERNON, female   DOB: 1991/06/30, 23 y.o.   MRN: [id number] MAURA HIOTT is a 23 y.o. G1P0 at [redacted]w[redacted]d by LMP admitted for induction of labor due to Post dates.   Subjective: Comfortable  Objective: BP 108/54  Pulse 55  Temp(Src) 97.7 F (36.5 C) (Oral)  Resp 18  Ht 5\' 8"  (1.727 m)  Wt 141.069 kg (311 lb)  BMI 47.30 kg/m2  LMP 03/13/2013       FHT:  130s--prolonged period with decreased LTV, no decelerations-->fluid bolus-->15 x 15 accelerations UC:   irregular SVE:   Dilation: 2 Effacement (%): 80 Station: -1 Exam by:: Bertram Millard, RN   Assessment / Plan: Prodromal labor  Labor: see above; re-start low dose Pitocin if able to monitor the fetus externally Preeclampsia:  n/a Fetal Wellbeing:  Category II-->Category I; continue to monitor closely Pain Control:  Labor support without medications I/D:  n/a Anticipated MOD:  NSVD  Antionette Char 01/20/2014, 4:32 AM

## 2014-01-20 NOTE — Anesthesia Postprocedure Evaluation (Signed)
  Anesthesia Post-op Note  Patient: [name]  Procedure(s) Performed: Procedure(s): CESAREAN SECTION (N/A)  Patient Location: PACU  Anesthesia Type:Epidural  Level of Consciousness: awake, alert  and oriented  Airway and Oxygen Therapy: Patient Spontanous Breathing  Post-op Pain: none  Post-op Assessment: Post-op Vital signs reviewed, Patient's Cardiovascular Status Stable, Respiratory Function Stable, Patent Airway, No signs of Nausea or vomiting, Pain level controlled, No headache and No backache  Post-op Vital Signs: Reviewed and stable  Last Vitals:  Filed Vitals:   01/20/14 1015  BP: 98/54  Pulse: 73  Temp:   Resp: 19    Complications: No apparent anesthesia complications

## 2014-01-20 NOTE — Op Note (Signed)
Cesarean Section Procedure Note   [name]   01/18/2014 - 01/20/2014  Indications: Postdates.  NRFHR with absent variability.  Failed Induction of labor.   Pre-operative Diagnosis:  Failed Induction of Labor.  Postdates.  Post-operative Diagnosis: Same   Surgeon: Brock Bad  Assistants: Francoise Ceo MD  Anesthesia: epidural  Procedure Details:  The patient was seen in the Holding Room. The risks, benefits, complications, treatment options, and expected outcomes were discussed with the patient. The patient concurred with the proposed plan, giving informed consent. The patient was identified as [name] and the procedure verified as C-Section Delivery. A Time Out was held and the above information confirmed.  After induction of anesthesia, the patient was draped and prepped in the usual sterile manner. A transverse incision was made and carried down through the subcutaneous tissue to the fascia. The fascial incision was made and extended transversely. The fascia was separated from the underlying rectus tissue superiorly and inferiorly. The peritoneum was identified and entered. The peritoneal incision was extended longitudinally. The utero-vesical peritoneal reflection was incised transversely and the bladder flap was bluntly freed from the lower uterine segment. A low transverse uterine incision was made. Delivered from cephalic presentation was a 3285 gram living newborn female infant(s). APGAR (1 MIN): 8   APGAR (5 MINS): 9   APGAR (10 MINS):    A cord ph was not sent. The umbilical cord was clamped and cut cord. A sample was obtained for evaluation. The placenta was removed Intact and appeared normal.  The uterine incision was closed with running locked sutures of 1-0 Monocryl. A second imbricating layer of the same suture was placed.  Hemostasis was observed. The paracolic gutters were irrigated. The parieto peritoneum was closed in a running fashion with 2-0 Vicryl.   The fascia was then reapproximated with running sutures of 0 Vicryl.  The skin was closed with suture.  Instrument, sponge, and needle counts were correct prior the abdominal closure and were correct at the conclusion of the case.    Findings:   Estimated Blood Loss:   Total IV Fluids:   Urine Output: 150CC OF clear urine  Specimens: Placenta to Pathology  Complications: no complications  Disposition: PACU - hemodynamically stable.  Maternal Condition: stable   Baby condition / location:  Couplet care / Skin to Skin    Signed: Surgeon(s): Brock Bad, MD Kathreen Cosier, MD

## 2014-01-20 NOTE — Transfer of Care (Signed)
Immediate Anesthesia Transfer of Care Note  Patient: [name]  Procedure(s) Performed: Procedure(s): CESAREAN SECTION (N/A)  Patient Location: PACU  Anesthesia Type:Epidural  Level of Consciousness: awake, alert  and oriented  Airway & Oxygen Therapy: Patient Spontanous Breathing  Post-op Assessment: Report given to PACU RN, Post -op Vital signs reviewed and stable and Patient moving all extremities  Post vital signs: Reviewed and stable  Complications: No apparent anesthesia complications

## 2014-01-20 NOTE — Anesthesia Postprocedure Evaluation (Signed)
  Anesthesia Post-op Note  Anesthesia Post Note  Patient: [name]  Procedure(s) Performed: Procedure(s) (LRB): CESAREAN SECTION (N/A)  Anesthesia type: Epidural  Patient location: Mother/Baby  Post pain: Pain level controlled  Post assessment: Post-op Vital signs reviewed  Last Vitals:  Filed Vitals:   01/20/14 1400  BP: 109/67  Pulse: 67  Temp: 36.8 C  Resp: 18    Post vital signs: Reviewed  Level of consciousness:alert  Complications: No apparent anesthesia complications

## 2014-01-20 NOTE — Anesthesia Procedure Notes (Signed)
Epidural Patient location during procedure: OB Start time: 01/20/2014 7:36 AM  Staffing Anesthesiologist: Lean Jaeger A. Performed by: anesthesiologist   Preanesthetic Checklist Completed: patient identified, site marked, surgical consent, pre-op evaluation, timeout performed, IV checked, risks and benefits discussed and monitors and equipment checked  Epidural Patient position: sitting Prep: site prepped and draped and DuraPrep Patient monitoring: continuous pulse ox and blood pressure Approach: midline Location: L3-L4 Injection technique: LOR air  Needle:  Needle type: Tuohy  Needle gauge: 17 G Needle length: 9 cm and 9 Needle insertion depth: 8 cm Catheter type: closed end flexible Catheter size: 19 Gauge Catheter at skin depth: 14 cm Test dose: negative and Other  Assessment Events: blood not aspirated, injection not painful, no injection resistance, negative IV test and no paresthesia  Additional Notes Patient identified. Risks and benefits discussed including failed block, incomplete  Pain control, post dural puncture headache, nerve damage, paralysis, blood pressure Changes, nausea, vomiting, reactions to medications-both toxic and allergic and post Partum back pain. All questions were answered. Patient expressed understanding and wished to proceed. Sterile technique was used throughout procedure. Epidural site was Dressed with sterile barrier dressing. No paresthesias, signs of intravascular injection Or signs of intrathecal spread were encountered.  Patient was more comfortable after the epidural was dosed. Please see RN's note for documentation of vital signs and FHR which are stable.

## 2014-01-20 NOTE — Anesthesia Preprocedure Evaluation (Addendum)
Anesthesia Evaluation  Patient identified by MRN, date of birth, ID band Patient awake    Reviewed: Allergy & Precautions, H&P , NPO status , Patient's Chart, lab work & pertinent test results  Airway Mallampati: III TM Distance: >3 FB Neck ROM: Full    Dental no notable dental hx. (+) Teeth Intact   Pulmonary asthma , Current Smoker,  breath sounds clear to auscultation  Pulmonary exam normal       Cardiovascular Rhythm:Regular Rate:Normal     Neuro/Psych negative neurological ROS  negative psych ROS   GI/Hepatic Neg liver ROS, GERD-  Medicated and Controlled,  Endo/Other  Morbid obesityPCOS Endometriosis  Renal/GU negative Renal ROS  negative genitourinary   Musculoskeletal negative musculoskeletal ROS (+)   Abdominal (+) + obese,   Peds  Hematology   Anesthesia Other Findings   Reproductive/Obstetrics (+) Pregnancy                          Anesthesia Physical Anesthesia Plan  ASA: III and emergent  Anesthesia Plan: Epidural   Post-op Pain Management:    Induction:   Airway Management Planned: Natural Airway  Additional Equipment:   Intra-op Plan:   Post-operative Plan:   Informed Consent: I have reviewed the patients History and Physical, chart, labs and discussed the procedure including the risks, benefits and alternatives for the proposed anesthesia with the patient or authorized representative who has indicated his/her understanding and acceptance.     Plan Discussed with: Anesthesiologist, CRNA and Surgeon  Anesthesia Plan Comments:        Anesthesia Quick Evaluation

## 2014-01-20 NOTE — Lactation Note (Signed)
This note was copied from the chart of Cassie Nyanza Kindschi. Lactation Consultation Note    Initial consult with this first time mom of a  Post term baby, 41 3/[redacted] weeks gestation, and 9 hours old. The baby fed for 15 minutes after birth, but is now sleepy. On exam, mom has very wide set breast, angled to outer chest. Mom has lots of easily expressed colostrum. I tried to wake baby to latch, but once at the breast, she lept. Mom and dad very tired. I showed mom how to hand express and spoon feed, and the baby fed at least 5 mls of expressed colostrum, and tolerated well. Lactation services and community services reviewed with mom, and some brief breast feeding teachngn started. Mom knows to call for questions/concerns.  Patient Name: [name] [id number] Date: 01/20/2014 Reason for consult: Initial assessment   Maternal Data Formula Feeding for Exclusion: No Infant to breast within first hour of birth: Yes Has patient been taught Hand Expression?: Yes Does the patient have breastfeeding experience prior to this delivery?: No  Feeding Feeding Type: Breast Milk Length of feed: 15 min  LATCH Score/Interventions Latch: Too sleepy or reluctant, no latch achieved, no sucking elicited. Intervention(s): Skin to skin;Teach feeding cues;Waking techniques Intervention(s): Adjust position;Assist with latch;Breast compression  Audible Swallowing: None Intervention(s): Skin to skin;Hand expression (lots of colostrum) Intervention(s): Skin to skin;Hand expression  Type of Nipple: Flat (very wide set breast, hx PCOS)  Comfort (Breast/Nipple): Soft / non-tender     Hold (Positioning): Assistance needed to correctly position infant at breast and maintain latch. Intervention(s): Breastfeeding basics reviewed;Support Pillows;Position options;Skin to skin  LATCH Score: 4  Lactation Tools Discussed/Used     Consult Status Consult Status: Follow-up Date: 01/21/14 Follow-up type:  In-patient    Alfred Levins 01/20/2014, 5:47 PM

## 2014-01-20 NOTE — Addendum Note (Signed)
Addendum created 01/20/14 1418 by Turner Daniels, CRNA   Modules edited: Notes Section   Notes Section:  File: [id number]

## 2014-01-20 NOTE — Progress Notes (Signed)
[name] is a 23 y.o. G1P0 at [redacted]w[redacted]d by LMP admitted for induction of labor due to Post dates. Due date 01-10-14.  Subjective:   Objective: BP 114/71  Pulse 61  Temp(Src) 97.7 F (36.5 C) (Oral)  Resp 18  Ht 5\' 8"  (1.727 m)  Wt 311 lb (141.069 kg)  BMI 47.30 kg/m2  LMP 03/13/2013      FHT:  FHR: 140-150 bpm, variability: absent,  accelerations:  Abscent,  decelerations:  Absent UC:   regular, every 3-5 minutes SVE:   Dilation: 2 Effacement (%): 80 Station: -1 Exam by:: Bertram Millard, RN  Labs: Lab Results  Component Value Date   WBC 9.5 01/18/2014   HGB 14.2 01/18/2014   HCT 39.4 01/18/2014   MCV 88.9 01/18/2014   PLT 180 01/18/2014    Assessment / Plan: Protracted latent phase.  Non reassuring FHR.  Will proceed with Cesarean Section for Failed Induction of Labor.  Labor: No progress for ~ 24 hours Preeclampsia:  n/a Fetal Wellbeing:  Category II Pain Control:  Epidural I/D:  n/a Anticipated MOD:  Cesarean Section  Brock Bad 01/20/2014, 8:15 AM

## 2014-01-21 ENCOUNTER — Encounter (HOSPITAL_COMMUNITY): Payer: Self-pay | Admitting: Obstetrics

## 2014-01-21 LAB — CBC
HCT: 34.1 % — ABNORMAL LOW (ref 36.0–46.0)
Hemoglobin: 11.8 g/dL — ABNORMAL LOW (ref 12.0–15.0)
MCH: 31.8 pg (ref 26.0–34.0)
MCHC: 34.6 g/dL (ref 30.0–36.0)
MCV: 91.9 fL (ref 78.0–100.0)
Platelets: 145 10*3/uL — ABNORMAL LOW (ref 150–400)
RBC: 3.71 MIL/uL — AB (ref 3.87–5.11)
RDW: 12.9 % (ref 11.5–15.5)
WBC: 15.8 10*3/uL — ABNORMAL HIGH (ref 4.0–10.5)

## 2014-01-21 MED ORDER — GENTAMICIN SULFATE 40 MG/ML IJ SOLN
Freq: Three times a day (TID) | INTRAVENOUS | Status: DC
  Administered 2014-01-21 – 2014-01-22 (×3): via INTRAVENOUS
  Filled 2014-01-21 (×4): qty 5

## 2014-01-21 MED ORDER — CLINDAMYCIN PHOSPHATE 900 MG/50ML IV SOLN: 900.0000 mg | Freq: Three times a day (TID) | INTRAVENOUS | Status: DC

## 2014-01-21 MED ORDER — RHO D IMMUNE GLOBULIN 1500 UNIT/2ML IJ SOSY
300.0000 ug | PREFILLED_SYRINGE | Freq: Once | INTRAMUSCULAR | Status: AC
  Administered 2014-01-21: 300 ug via INTRAVENOUS
  Filled 2014-01-21: qty 2

## 2014-01-21 NOTE — Progress Notes (Signed)
Pt IV infiltrated, Dr. Clearance Coots aware. Attempted restart x2. Pt refused further attempts and IV antibiotics. Dr. Clearance Coots notified. Pt agreed to have CRNA to come to attempt restart of IV.

## 2014-01-21 NOTE — Progress Notes (Signed)
UR chart review completed.  

## 2014-01-21 NOTE — Progress Notes (Signed)
Subjective: Postpartum Day 1: Cesarean Delivery Patient reports incisional pain, tolerating PO and + flatus.    Objective: Vital signs in last 24 hours: Temp:  [97.5 F (36.4 C)-99.5 F (37.5 C)] 97.5 F (36.4 C) (06/03 0526) Pulse Rate:  [64-80] 76 (06/03 0526) Resp:  [15-21] 18 (06/03 0526) BP: (75-123)/(42-78) 120/70 mmHg (06/03 0526) SpO2:  [94 %-100 %] 95 % (06/03 0526)  Physical Exam:  General: alert and no distress Lochia: appropriate Uterine Fundus: Tender Incision: healing well DVT Evaluation: No evidence of DVT seen on physical exam.   Recent Labs  01/18/14 2230 01/21/14 0624  HGB 14.2 11.8*  HCT 39.4 34.1*    Assessment/Plan: Status post Cesarean section. Doing well postoperatively. Mild post op endometritis.  Will start IV antibiotics.  IV Natasha Bence / Clinda started.  Brock Bad 01/21/2014, 7:57 AM

## 2014-01-21 NOTE — Progress Notes (Signed)
ANTIBIOTIC CONSULT NOTE - INITIAL  Pharmacy Consult for Gentamicin Indication: Endometritis  Allergies  Allergen Reactions  . Amoxicillin     Needs probiotic. Causes yeast infections    Patient Measurements: Height: 5\' 8"  (172.7 cm) Weight: 311 lb (141.069 kg) IBW/kg (Calculated) : 63.9 Adjusted Body Weight: 87 kg  Vital Signs: Temp: 97.5 F (36.4 C) (06/03 0526) Temp src: Oral (06/03 0526) BP: 120/70 mmHg (06/03 0526) Pulse Rate: 76 (06/03 0526)  Labs:  Recent Labs  01/18/14 2230 01/21/14 0624  WBC 9.5 15.8*  HGB 14.2 11.8*  PLT 180 145*   No results found for this basename: GENTTROUGH, GENTPEAK, GENTRANDOM,  in the last 72 hours   Microbiology: No results found for this or any previous visit (from the past 720 hour(s)).  Medications:  Clindamycin 900mg  IV q8hrs  Assessment: 23 y.o. female s/p C section with endometritis. Estimated Ke = 0.327, Vd = 0.25L/kg  Goal of Therapy:  Gentamicin peak 6-8 mg/L and Trough < 1 mg/L  Plan:  Gentamicin 200 mg IV every 8hrs  Check Scr with next labs if gentamicin continued. Will check gentamicin levels if continued > 72hr or clinically indicated.  Wendie Simmer, PharmD, BCPS Clinical Pharmacist

## 2014-01-21 NOTE — Lactation Note (Signed)
This note was copied from the chart of Cassie Karizma Radi. Lactation Consultation Note Follow up consult:  Mother having difficulty latching baby on right breast. Provided mother with shells and hand pump to help evert nipples.   Reveiwed latching but baby sleepy.  Encouraged mother to try again when baby cues.  Patient Name: [name] [id number] Date: 01/21/2014 Reason for consult: Follow-up assessment   Maternal Data    Feeding Feeding Type: Breast Fed Length of feed: 20 min  LATCH Score/Interventions                      Lactation Tools Discussed/Used     Consult Status Consult Status: Follow-up Date: 01/22/14 Follow-up type: In-patient    Dulce Sellar Larin Depaoli 01/21/2014, 2:06 PM

## 2014-01-21 NOTE — Addendum Note (Signed)
Addendum created 01/21/14 1813 by Graciela Husbands, CRNA   Modules edited: Anesthesia LDA, Charges VN

## 2014-01-22 ENCOUNTER — Encounter (HOSPITAL_COMMUNITY): Payer: Self-pay | Admitting: *Deleted

## 2014-01-22 LAB — CBC WITH DIFFERENTIAL/PLATELET
BASOS ABS: 0 10*3/uL (ref 0.0–0.1)
Basophils Relative: 0 % (ref 0–1)
EOS PCT: 1 % (ref 0–5)
Eosinophils Absolute: 0.2 10*3/uL (ref 0.0–0.7)
HCT: 33 % — ABNORMAL LOW (ref 36.0–46.0)
Hemoglobin: 11.4 g/dL — ABNORMAL LOW (ref 12.0–15.0)
LYMPHS ABS: 2.1 10*3/uL (ref 0.7–4.0)
LYMPHS PCT: 16 % (ref 12–46)
MCH: 31.8 pg (ref 26.0–34.0)
MCHC: 34.5 g/dL (ref 30.0–36.0)
MCV: 92.2 fL (ref 78.0–100.0)
Monocytes Absolute: 1 10*3/uL (ref 0.1–1.0)
Monocytes Relative: 8 % (ref 3–12)
NEUTROS PCT: 75 % (ref 43–77)
Neutro Abs: 9.7 10*3/uL — ABNORMAL HIGH (ref 1.7–7.7)
PLATELETS: 147 10*3/uL — AB (ref 150–400)
RBC: 3.58 MIL/uL — ABNORMAL LOW (ref 3.87–5.11)
RDW: 13.1 % (ref 11.5–15.5)
WBC: 13 10*3/uL — AB (ref 4.0–10.5)

## 2014-01-22 LAB — RH IG WORKUP (INCLUDES ABO/RH)
ABO/RH(D): O NEG
ANTIBODY SCREEN: NEGATIVE
Fetal Screen: NEGATIVE
Gestational Age(Wks): 41
UNIT DIVISION: 0

## 2014-01-22 MED ORDER — AMOXICILLIN-POT CLAVULANATE 875-125 MG PO TABS
1.0000 | ORAL_TABLET | Freq: Two times a day (BID) | ORAL | Status: DC
  Administered 2014-01-22 – 2014-01-23 (×3): 1 via ORAL
  Filled 2014-01-22 (×4): qty 1

## 2014-01-22 NOTE — Lactation Note (Signed)
This note was copied from the chart of Cassie Sussie Diogo. Lactation Consultation Note  Follow up consult;  Mother states breastfeeding improving and prepumping with hand pump helped. She is starting to get sore, abrasion on left nipple.  Provided comfort gels and reviewed applying ebm. Reviewed engorgement care and encouraged mother to call if further assistance is needed.   Patient Name: [name] [id number] Date: 01/22/2014     Maternal Data    Feeding Feeding Type: Breast Fed Length of feed: 17 min  LATCH Score/Interventions                      Lactation Tools Discussed/Used     Consult Status      Dulce Sellar Maloni Musleh 01/22/2014, 9:35 AM

## 2014-01-22 NOTE — Progress Notes (Signed)
Subjective: Postpartum Day 2: Cesarean Delivery Patient reports tolerating PO, + flatus and no problems voiding.    Objective: Vital signs in last 24 hours: Temp:  [98.3 F (36.8 C)-98.4 F (36.9 C)] 98.4 F (36.9 C) (06/03 1855) Pulse Rate:  [89-92] 92 (06/03 1855) Resp:  [18-19] 19 (06/03 1855) BP: (106-119)/(67-70) 119/70 mmHg (06/03 1855) SpO2:  [97 %] 97 % (06/03 1115)  Physical Exam:  General: alert and no distress Lochia: appropriate Uterine Fundus: tender Incision: healing well DVT Evaluation: No evidence of DVT seen on physical exam.   Recent Labs  01/21/14 0624  HGB 11.8*  HCT 34.1*    Assessment/Plan: Status post Cesarean section. Postoperative course complicated by endometritis.  Continue current care.  Brock Bad 01/22/2014, 5:53 AM

## 2014-01-23 MED ORDER — OXYCODONE-ACETAMINOPHEN 5-325 MG PO TABS: 1.0000 | ORAL_TABLET | ORAL | Status: DC | PRN

## 2014-01-23 MED ORDER — IBUPROFEN 600 MG PO TABS: 600.0000 mg | ORAL_TABLET | Freq: Four times a day (QID) | ORAL | Status: DC | PRN

## 2014-01-23 MED ORDER — AMOXICILLIN-POT CLAVULANATE 875-125 MG PO TABS: 1.0000 | ORAL_TABLET | Freq: Two times a day (BID) | ORAL | Status: DC

## 2014-01-23 NOTE — Discharge Summary (Signed)
Obstetric Discharge Summary Reason for Admission: induction of labor Prenatal Procedures: ultrasound Intrapartum Procedures: cesarean: low cervical, transverse Postpartum Procedures: none Complications-Operative and Postpartum: none Hemoglobin  Date Value Ref Range Status  01/22/2014 11.4* 12.0 - 15.0 g/dL Final     HCT  Date Value Ref Range Status  01/22/2014 33.0* 36.0 - 46.0 % Final    Physical Exam:  General: alert and no distress Lochia: appropriate Uterine Fundus: firm Incision: healing well DVT Evaluation: No evidence of DVT seen on physical exam.  Discharge Diagnoses: Term Pregnancy-delivered and post op endometritis  Discharge Information: Date: 01/23/2014 Activity: pelvic rest Diet: routine Medications: PNV, Ibuprofen, Colace, Percocet and Augmentin Condition: stable Instructions: refer to practice specific booklet Discharge to: home Follow-up Information   Follow up with Okie Bogacz A, MD In 2 weeks.   Specialty:  Obstetrics and Gynecology   Contact information:   [address] [place] [address] [phone]       Newborn Data: Live born female  Birth Weight: 7 lb 3.9 oz (3285 g) APGAR: 8, 9  Home with mother.  Brock Bad 01/23/2014, 7:37 AM

## 2014-01-23 NOTE — Progress Notes (Signed)
Subjective: Postpartum Day 3: Cesarean Delivery Patient reports tolerating PO, + flatus, + BM and no problems voiding.    Objective: Vital signs in last 24 hours: Temp:  [97.8 F (36.6 C)-98.6 F (37 C)] 97.8 F (36.6 C) (06/05 0502) Pulse Rate:  [51-82] 51 (06/05 0502) Resp:  [17-18] 17 (06/05 0502) BP: (98-106)/(64-71) 98/64 mmHg (06/05 0502) SpO2:  [98 %] 98 % (06/05 0502)  Physical Exam:  General: alert and no distress Lochia: appropriate Uterine Fundus: firm Incision: healing well DVT Evaluation: No evidence of DVT seen on physical exam.   Recent Labs  01/21/14 0624 01/22/14 0826  HGB 11.8* 11.4*  HCT 34.1* 33.0*    Assessment/Plan: Status post Cesarean section. Doing well postoperatively. Post op endometritis, resolving.  Discharge home with standard precautions and return to clinic in 2 weeks.  Continue Augmentin x 7 days po.  Brock Bad 01/23/2014, 7:27 AM

## 2014-01-23 NOTE — Lactation Note (Signed)
This note was copied from the chart of Cassie Destany Beauman. Lactation Consultation Note  Mother exhausted from cluster feeding all night.  Discussed strategies for encouraging longer feeds during the night. Helped mother with side lying more restful position.  Rhythmical sucks and swallows observed.  LS8. Mother has history of PCOS.  Encouraged mother to post pump with DEBP for 15 min both breasts after feedings to boost milk supply. Reviewed engorgement care and outpatient appointments if needed.    Patient Name: [name] Date: 01/23/2014 Reason for consult: Follow-up assessment   Maternal Data    Feeding Feeding Type: Breast Fed Length of feed: 20 min  LATCH Score/Interventions Latch: Grasps breast easily, tongue down, lips flanged, rhythmical sucking. Intervention(s): Breast massage  Audible Swallowing: Spontaneous and intermittent Intervention(s): Alternate breast massage  Type of Nipple: Flat Intervention(s): Shells;Hand pump  Comfort (Breast/Nipple): Filling, red/small blisters or bruises, mild/mod discomfort  Problem noted: Mild/Moderate discomfort Interventions (Mild/moderate discomfort): Comfort gels;Hand expression  Hold (Positioning): No assistance needed to correctly position infant at breast. Intervention(s): Position options  LATCH Score: 8  Lactation Tools Discussed/Used     Consult Status Consult Status: PRN    [name] 01/23/2014, 9:45 AM

## 2014-01-26 ENCOUNTER — Encounter: Payer: Medicaid Other | Admitting: Obstetrics

## 2014-01-26 ENCOUNTER — Encounter: Payer: Self-pay | Admitting: Obstetrics

## 2014-01-26 ENCOUNTER — Ambulatory Visit (INDEPENDENT_AMBULATORY_CARE_PROVIDER_SITE_OTHER): Payer: Medicaid Other | Admitting: Obstetrics

## 2014-01-26 VITALS — BP 118/87 | HR 87 | Temp 98.1°F | Ht 69.0 in | Wt 304.0 lb

## 2014-01-26 DIAGNOSIS — R52 Pain, unspecified: Secondary | ICD-10-CM

## 2014-01-26 MED ORDER — OXYCODONE HCL 10 MG PO TABS: 10.0000 mg | ORAL_TABLET | Freq: Four times a day (QID) | ORAL | Status: DC | PRN

## 2014-01-26 NOTE — Progress Notes (Signed)
Subjective:     [name] is a 23 y.o. female who presents for a postpartum visit. She is 1 week postpartum following a low cervical transverse Cesarean section. I have fully reviewed the prenatal and intrapartum course. The delivery was at 39 gestational weeks. Outcome:   Primary LTCS.  Anesthesia: spinal. Postpartum course has been normal. Baby's course has been normal. Baby is feeding by breast. Bleeding thin lochia. Bowel function is normal. Bladder function is normal. Patient is not sexually active. Contraception method is abstinence. Postpartum depression screening: negative.  The following portions of the patient's history were reviewed and updated as appropriate: allergies, current medications, past family history, past medical history, past social history, past surgical history and problem list.  Review of Systems A comprehensive review of systems was negative.   Objective:    BP 118/87  Pulse 87  Temp(Src) 98.1 F (36.7 C)  Ht 5\' 9"  (1.753 m)  Wt 304 lb (137.893 kg)  BMI 44.87 kg/m2  Breastfeeding? Yes  General:  alert and no distress Abdomen:  Soft, nontender.  Incision C, D, I.    Assessment:     Normal postpartum exam. Pap smear not done at today's visit.   Plan:    1. Contraception: abstinence 2. Continue PNV's 3. Follow up in: 2 weeks or as needed.

## 2014-02-05 ENCOUNTER — Ambulatory Visit (HOSPITAL_COMMUNITY)
Admission: RE | Admit: 2014-02-05 | Discharge: 2014-02-05 | Disposition: A | Discharge status: Home / Self Care | Payer: Medicaid Other | Source: Ambulatory Visit | Attending: Obstetrics | Admitting: Obstetrics

## 2014-02-05 NOTE — Lactation Note (Signed)
Adult Lactation Consultation Outpatient Visit Note  Patient Name: [name]    BABY: [name] Date of Birth: 04-28-1991                   DOB: [birth year] Gestational Age at Delivery: 41.3     BIRTH WEIGHT: 7-3 Type of Delivery: C/S                        DISCHARGE WEIGHT: 6-14.2(5%)                                                            WEIGHT TODAY: 7-11.7 Breastfeeding History: Frequency of Breastfeeding: every 2-3 hours Length of Feeding: 20-40 minutes Voids: qs Stools: 1 per day  Supplementing / Method:EBM/similac 1-2 ounces pc if still acting hungry Pumping:  Type of Pump:pump in style   Frequency:5-6 times per day  Volume:  1-3 ounces  Comments:    Consultation Evaluation:Mom and 7816 day old infant here for feeding assessment.  Mom has been supplementing with formula due to weight loss .  Baby has gained 10 ounces in one week.  Mom states baby is not latching as well since bottle supplementation.  Assisted mom with positioning and techniques for deeper latch.  Serenity latched well and nursed actively,  Mom will continue feeding on cue and pumping after every other feeding to ensure good milk supply.  Encouraged breastfeeding support group for weight checks and further support.  Initial Feeding Assessment: Pre-feed [id number]:3508 Post-feed [id number]:3556 Amount Transferred:48 mls Comments:  Additional Feeding Assessment: Pre-feed Weight: Post-feed Weight: Amount Transferred: Comments:  Additional Feeding Assessment: Pre-feed Weight: Post-feed Weight: Amount Transferred: Comments:  Total Breast milk Transferred this Visit:  Total Supplement Given:   Additional Interventions:   Follow-UpSupport group and will call prn      Hansel Feinsteinowell, Laura Ann 02/05/2014, 9:53 AM

## 2014-02-06 ENCOUNTER — Ambulatory Visit (HOSPITAL_COMMUNITY): Payer: Medicaid Other

## 2014-02-09 ENCOUNTER — Ambulatory Visit: Payer: Medicaid Other | Admitting: Obstetrics

## 2014-02-10 ENCOUNTER — Ambulatory Visit (INDEPENDENT_AMBULATORY_CARE_PROVIDER_SITE_OTHER): Payer: Medicaid Other | Admitting: Obstetrics

## 2014-02-10 ENCOUNTER — Encounter: Payer: Self-pay | Admitting: Obstetrics

## 2014-02-10 VITALS — BP 113/78 | HR 51 | Temp 98.7°F | Ht 68.5 in | Wt 287.0 lb

## 2014-02-10 DIAGNOSIS — R52 Pain, unspecified: Secondary | ICD-10-CM

## 2014-02-10 MED ORDER — OXYCODONE HCL 10 MG PO TABS: 10.0000 mg | ORAL_TABLET | Freq: Four times a day (QID) | ORAL | Status: DC | PRN

## 2014-02-10 NOTE — Progress Notes (Signed)
Subjective:     [name] is a 23 y.o. female who presents for a postpartum visit. She is 3 weeks postpartum following a low cervical transverse Cesarean section. I have fully reviewed the prenatal and intrapartum course. The delivery was at 39 gestational weeks. Outcome:  Primary LTCS. Anesthesia: spinal. Postpartum course has been normal. Baby's course has been normal. Baby is feeding by breast. Bleeding thin lochia. Bowel function is normal. Bladder function is normal. Patient is not sexually active. Contraception method is abstinence. Postpartum depression screening: negative.  The following portions of the patient's history were reviewed and updated as appropriate: allergies, current medications, past family history, past medical history, past social history, past surgical history and problem list.  Review of Systems A comprehensive review of systems was negative.   Objective:    BP 113/78  Pulse 51  Temp(Src) 98.7 F (37.1 C)  Ht 5' 8.5" (1.74 m)  Wt 287 lb (130.182 kg)  BMI 43.00 kg/m2  Breastfeeding? Yes  General:  alert and no distress PE;      Breasts:  Negative      Abdomen:  Soft, nontender.  Incision C, D, I and nontender.    Assessment:     Normal postpartum exam. Pap smear not done at today's visit.   Plan:    1. Contraception: condoms 2. Does want hormonal birth control. 3. Follow up in: 3 weeks or as needed.

## 2014-02-12 ENCOUNTER — Encounter: Payer: Self-pay | Admitting: *Deleted

## 2014-03-03 ENCOUNTER — Ambulatory Visit (INDEPENDENT_AMBULATORY_CARE_PROVIDER_SITE_OTHER): Payer: Medicaid Other | Admitting: Obstetrics

## 2014-03-03 ENCOUNTER — Encounter: Payer: Self-pay | Admitting: Obstetrics

## 2014-03-03 DIAGNOSIS — Z3009 Encounter for other general counseling and advice on contraception: Secondary | ICD-10-CM

## 2014-03-03 MED ORDER — NORETHINDRONE 0.35 MG PO TABS: 1.0000 | ORAL_TABLET | Freq: Every day | ORAL | Status: DC

## 2014-03-03 NOTE — Progress Notes (Signed)
Subjective:     [name] is a 23 y.o. female who presents for a postpartum visit. She is 6 weeks postpartum following a low cervical transverse Cesarean section. I have fully reviewed the prenatal and intrapartum course. The delivery was at 39 gestational weeks. Outcome: primary cesarean section, low transverse incision. Anesthesia: epidural. Postpartum course has been normal. Baby's course has been normal. Baby is feeding by breast. Bleeding no bleeding. Bowel function is normal. Bladder function is normal. Patient is sexually active. Contraception method is none. Postpartum depression screening: negative.  Tobacco, alcohol and substance abuse history reviewed.  Adult immunizations reviewed including TDAP, rubella and varicella.  The following portions of the patient's history were reviewed and updated as appropriate: allergies, current medications, past family history, past medical history, past social history, past surgical history and problem list.  Review of Systems A comprehensive review of systems was negative.   Objective:    BP 119/86  Pulse 76  Temp(Src) 98.3 F (36.8 C)  Wt 288 lb (130.636 kg)  General:  alert and no distress   Breasts:  inspection negative, no nipple discharge or bleeding, no masses or nodularity palpable  Lungs: clear to auscultation bilaterally  Heart:  regular rate and rhythm, S1, S2 normal, no murmur, click, rub or gallop  Abdomen: normal findings: soft, non-tender.  Incision C, D, I.   Vulva:  normal  Vagina: normal vagina  Cervix:  no lesions  Corpus: normal size, contour, position, consistency, mobility, non-tender  Adnexa:  no mass, fullness, tenderness  Rectal Exam: Not performed.           Assessment:     Normal postpartum exam. Pap smear not done at today's visit.  Plan:    1. Contraception: oral progesterone-only contraceptive 2. Micronor Rx 3. Follow up in: several months or as needed.

## 2014-03-26 ENCOUNTER — Other Ambulatory Visit: Payer: Self-pay | Admitting: Medical

## 2014-04-02 IMAGING — US US OB NUCHAL TRANSLUCENCY 1ST GEST
1 series · 13 of 28 positions shown · non-contrast
Comparison: none

[Series 1: us ob nuchal translucency 1st gest · 0.18mm/px · 13 of 30 slices shown]
[im 2/30]
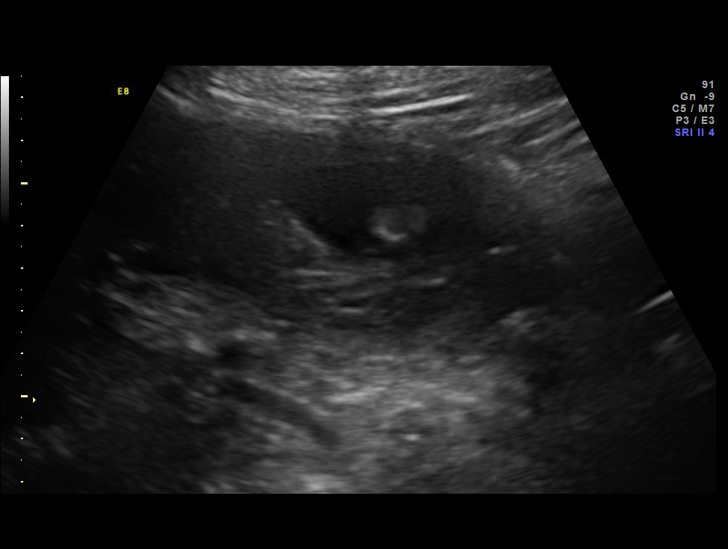
[im 4/30]
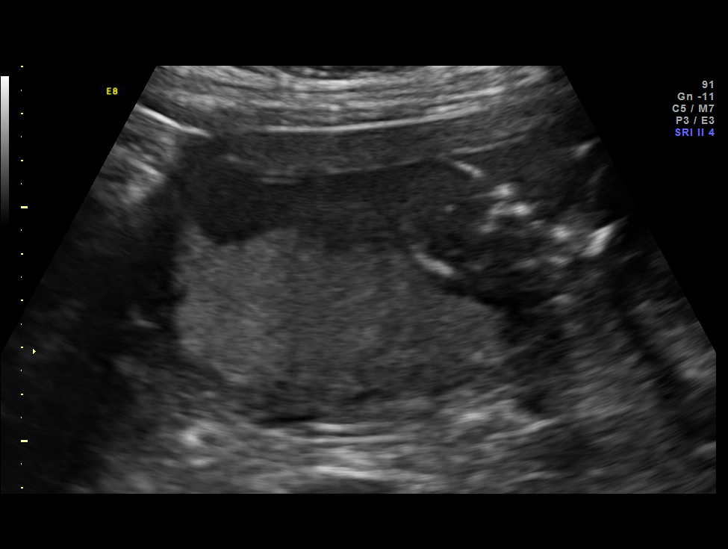
[im 6/30]
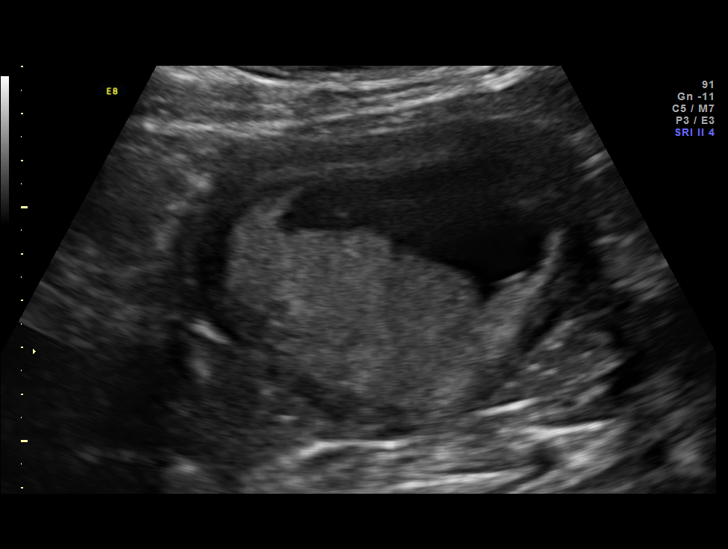
[im 8/30]
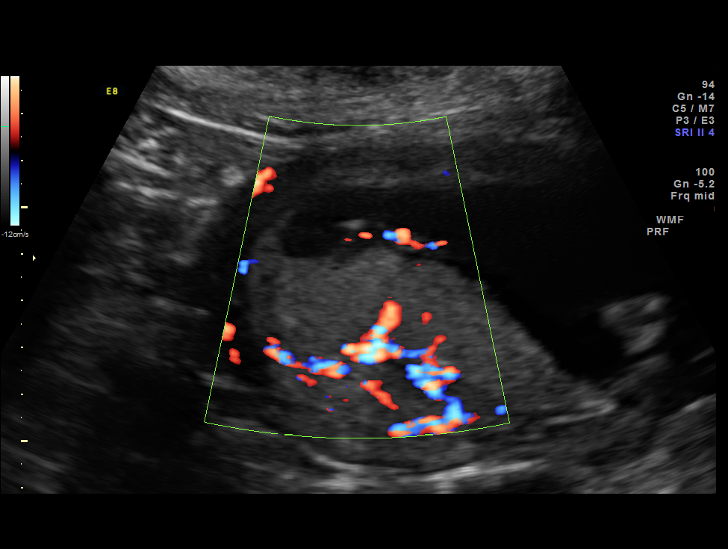
[im 10/30]
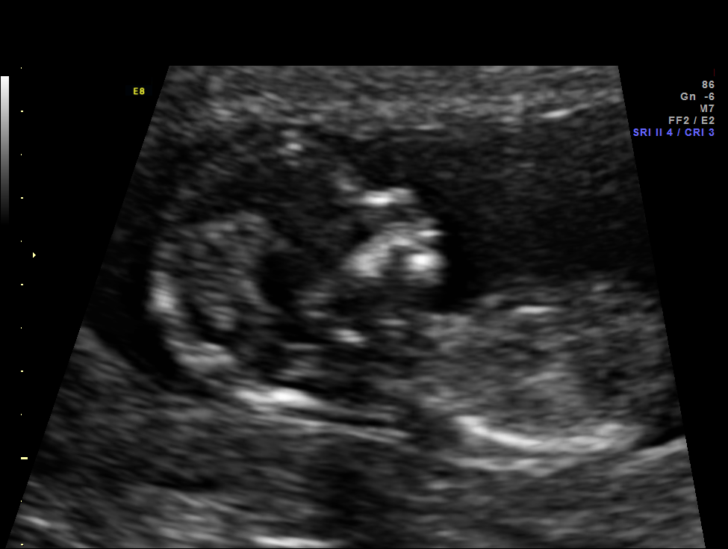
[im 12/30]
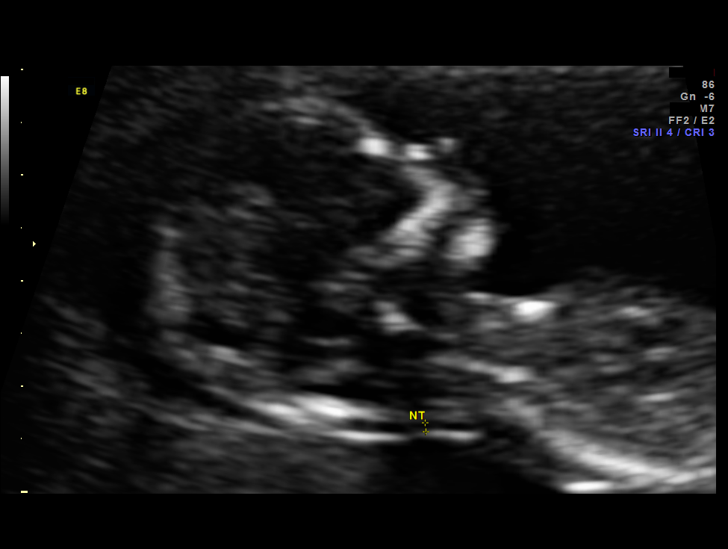
[im 16/30]
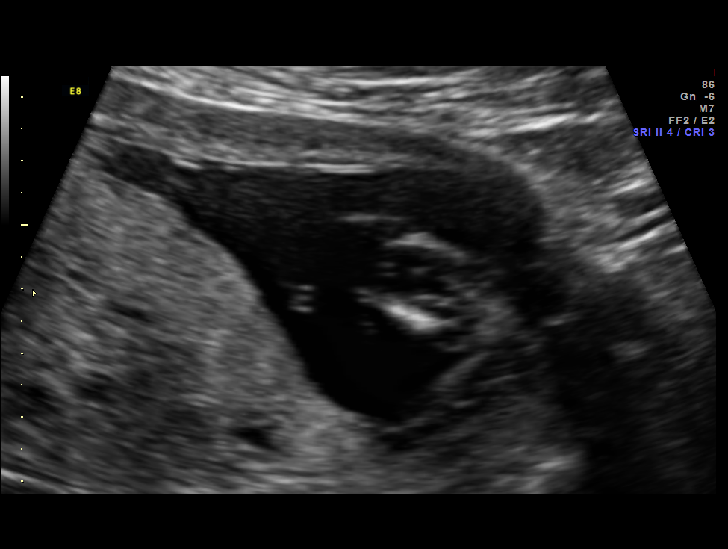
[im 18/30]
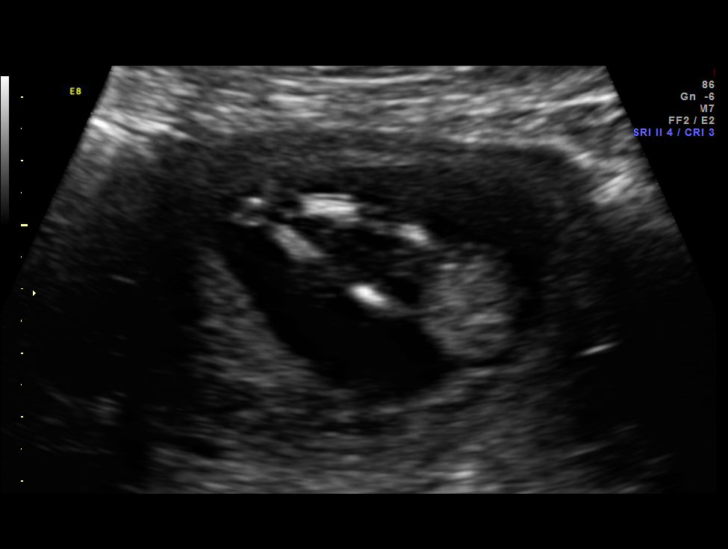
[im 20/30]
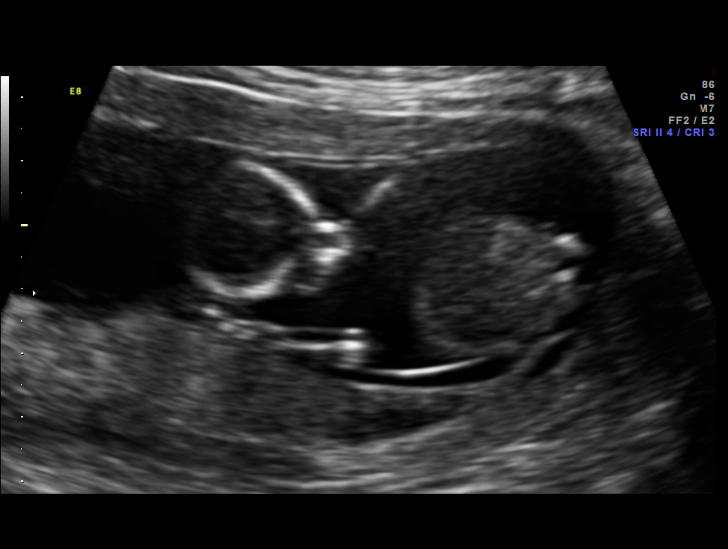
[im 22/30]
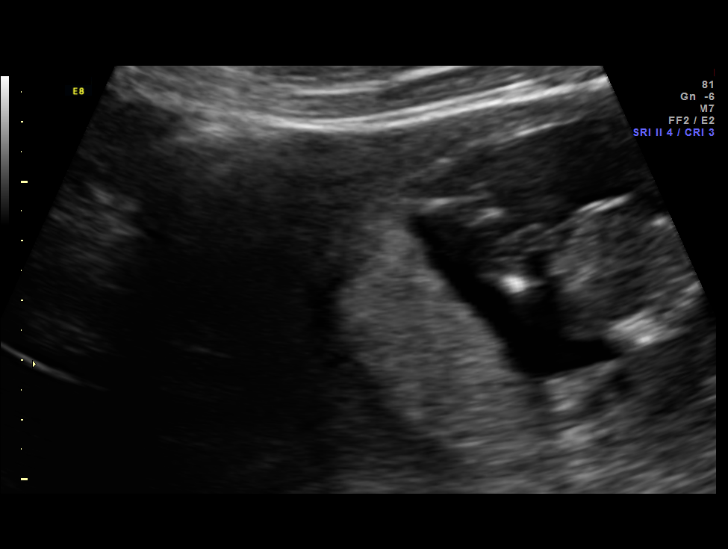
[im 24/30]
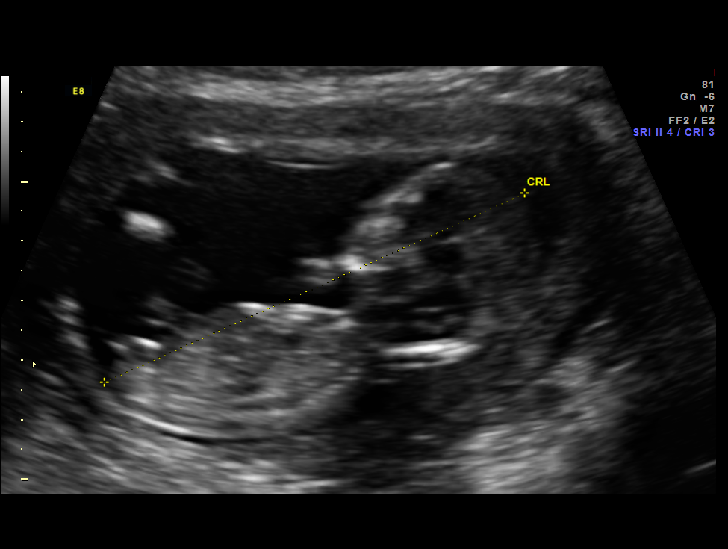
[im 26/30]
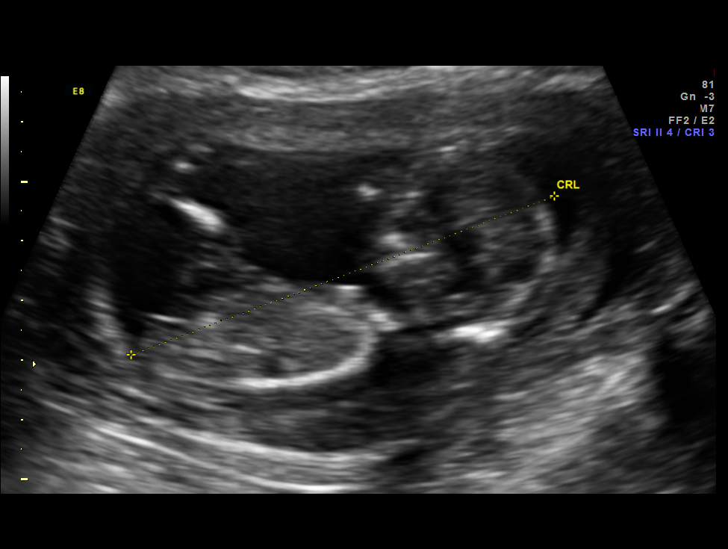
[im 28/30]
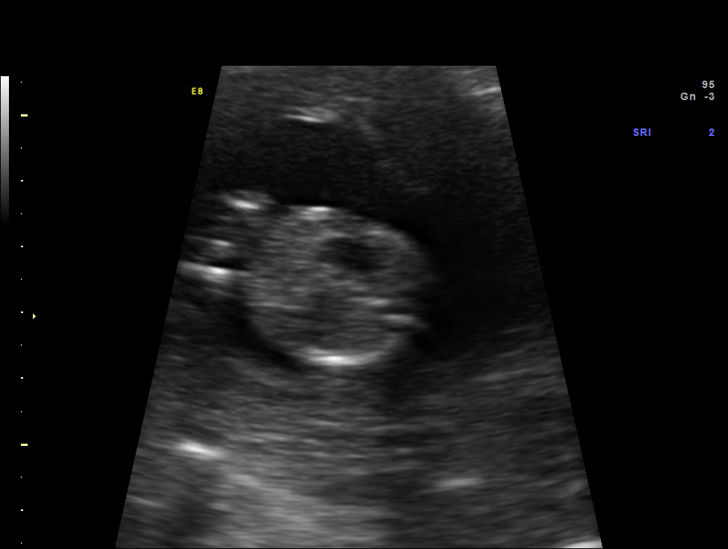

[13 of 28 positions shown; findings below may reference images not displayed]

OBSTETRICS REPORT
                      (Signed Final 07/09/2013 [DATE])

Service(s) Provided

 US FETAL NUCHAL TRANSLUCENCY                          76813.0
 MEASUREMENT
Indications

 First trimester aneuploidy screen (NT)
Fetal Evaluation

 Num Of Fetuses:    1
 Fetal Heart Rate:  139                          bpm
 Cardiac Activity:  Observed
 Placenta:          Posterior, above cervical
                    os
 P. Cord            Visualized
 Insertion:
Gestational Age

 LMP:           16w 6d        Date:  03/13/13                 EDD:   12/18/13
 Best:          13w 4d     Det. By:  U/S C R L (06/11/13)     EDD:   01/10/14
1st Trimester Genetic Sonogram Screening

 CRL:            75.5  mm    G. Age:   13w 2d                 EDD:   01/12/14
 Nuc Trans:       1.1  mm
 Nasal Bone:                 Present
Cervix Uterus Adnexa

 Cervix:       Normal appearance by transabdominal scan.
 Left Ovary:    Not visualized. No adnexal mass visualized.
 Right Ovary:   Not visualized. No adnexal mass visualized.
Impression

 IUP at 13+4 weeks
 No gross abnormalities identified
 NT measurement was within normal limits for this GA; NB
 present
 Normal amniotic fluid volume
 Measurements consistent with prior US
Recommendations

 Offer MSAFP in the second trimester for ONTD screening
 Offer detailed U/S by 18 weeks

## 2014-06-22 ENCOUNTER — Encounter: Payer: Self-pay | Admitting: Obstetrics

## 2014-09-01 ENCOUNTER — Emergency Department (HOSPITAL_COMMUNITY): Payer: Medicaid Other

## 2014-09-01 ENCOUNTER — Emergency Department (HOSPITAL_COMMUNITY)
Admission: EM | Admit: 2014-09-01 | Discharge: 2014-09-01 | Disposition: A | Discharge status: Home / Self Care | Payer: Medicaid Other | Attending: Emergency Medicine | Admitting: Emergency Medicine

## 2014-09-01 ENCOUNTER — Encounter (HOSPITAL_COMMUNITY): Payer: Self-pay | Admitting: Emergency Medicine

## 2014-09-01 DIAGNOSIS — Y998 Other external cause status: Secondary | ICD-10-CM | POA: Insufficient documentation

## 2014-09-01 DIAGNOSIS — S300XXA Contusion of lower back and pelvis, initial encounter: Secondary | ICD-10-CM | POA: Insufficient documentation

## 2014-09-01 DIAGNOSIS — Y9389 Activity, other specified: Secondary | ICD-10-CM | POA: Insufficient documentation

## 2014-09-01 DIAGNOSIS — Z87891 Personal history of nicotine dependence: Secondary | ICD-10-CM | POA: Insufficient documentation

## 2014-09-01 DIAGNOSIS — Z87828 Personal history of other (healed) physical injury and trauma: Secondary | ICD-10-CM | POA: Insufficient documentation

## 2014-09-01 DIAGNOSIS — W19XXXA Unspecified fall, initial encounter: Secondary | ICD-10-CM

## 2014-09-01 DIAGNOSIS — Y9289 Other specified places as the place of occurrence of the external cause: Secondary | ICD-10-CM | POA: Insufficient documentation

## 2014-09-01 DIAGNOSIS — Z8639 Personal history of other endocrine, nutritional and metabolic disease: Secondary | ICD-10-CM | POA: Insufficient documentation

## 2014-09-01 DIAGNOSIS — J45909 Unspecified asthma, uncomplicated: Secondary | ICD-10-CM | POA: Insufficient documentation

## 2014-09-01 DIAGNOSIS — Z880 Allergy status to penicillin: Secondary | ICD-10-CM | POA: Insufficient documentation

## 2014-09-01 DIAGNOSIS — Z79899 Other long term (current) drug therapy: Secondary | ICD-10-CM | POA: Insufficient documentation

## 2014-09-01 DIAGNOSIS — N809 Endometriosis, unspecified: Secondary | ICD-10-CM | POA: Insufficient documentation

## 2014-09-01 MED ORDER — IBUPROFEN 600 MG PO TABS: 600.0000 mg | ORAL_TABLET | Freq: Three times a day (TID) | ORAL | Status: DC | PRN

## 2014-09-01 MED ORDER — CYCLOBENZAPRINE HCL 5 MG PO TABS: 5.0000 mg | ORAL_TABLET | Freq: Three times a day (TID) | ORAL | Status: AC

## 2014-09-01 MED ORDER — HYDROCODONE-ACETAMINOPHEN 5-325 MG PO TABS: 1.0000 | ORAL_TABLET | Freq: Four times a day (QID) | ORAL | Status: DC | PRN

## 2014-09-01 MED ORDER — CYCLOBENZAPRINE HCL 10 MG PO TABS
5.0000 mg | ORAL_TABLET | Freq: Once | ORAL | Status: AC
  Administered 2014-09-01: 5 mg via ORAL
  Filled 2014-09-01: qty 1

## 2014-09-01 MED ORDER — HYDROCODONE-ACETAMINOPHEN 5-325 MG PO TABS
1.0000 | ORAL_TABLET | Freq: Once | ORAL | Status: AC
  Administered 2014-09-01: 1 via ORAL
  Filled 2014-09-01: qty 1

## 2014-09-01 MED ORDER — IBUPROFEN 200 MG PO TABS
600.0000 mg | ORAL_TABLET | Freq: Once | ORAL | Status: AC
  Administered 2014-09-01: 600 mg via ORAL
  Filled 2014-09-01: qty 3

## 2014-09-01 NOTE — Discharge Instructions (Signed)
Contusion °A contusion is a deep bruise. Contusions happen when an injury causes bleeding under the skin. Signs of bruising include pain, puffiness (swelling), and discolored skin. The contusion may turn blue, purple, or yellow. °HOME CARE  °· Put ice on the injured area. °¨ Put ice in a plastic bag. °¨ Place a towel between your skin and the bag. °¨ Leave the ice on for 15-20 minutes, 03-04 times a day. °· Only take medicine as told by your doctor. °· Rest the injured area. °· If possible, raise (elevate) the injured area to lessen puffiness. °GET HELP RIGHT AWAY IF:  °· You have more bruising or puffiness. °· You have pain that is getting worse. °· Your puffiness or pain is not helped by medicine. °MAKE SURE YOU:  °· Understand these instructions. °· Will watch your condition. °· Will get help right away if you are not doing well or get worse. °Document Released: 01/24/2008 Document Revised: 10/30/2011 Document Reviewed: 06/12/2011 °ExitCare® Patient Information ©2015 [employer]. This information is not intended to replace advice given to you by your health care provider. Make sure you discuss any questions you have with your health care provider. ° °Cryotherapy °Cryotherapy means treatment with cold. Ice or gel packs can be used to reduce both pain and swelling. Ice is the most helpful within the first 24 to 48 hours after an injury or flare-up from overusing a muscle or joint. Sprains, strains, spasms, burning pain, shooting pain, and aches can all be eased with ice. Ice can also be used when recovering from surgery. Ice is effective, has very few side effects, and is safe for most people to use. °PRECAUTIONS  °Ice is not a safe treatment option for people with: °· Raynaud phenomenon. This is a condition affecting small blood vessels in the extremities. Exposure to cold may cause your problems to return. °· Cold hypersensitivity. There are many forms of cold hypersensitivity, including: °¨ Cold urticaria.  Red, itchy hives appear on the skin when the tissues begin to warm after being iced. °¨ Cold erythema. This is a red, itchy rash caused by exposure to cold. °¨ Cold hemoglobinuria. Red blood cells break down when the tissues begin to warm after being iced. The hemoglobin that carry oxygen are passed into the urine because they cannot combine with blood proteins fast enough. °· Numbness or altered sensitivity in the area being iced. °If you have any of the following conditions, do not use ice until you have discussed cryotherapy with your caregiver: °· Heart conditions, such as arrhythmia, angina, or chronic heart disease. °· High blood pressure. °· Healing wounds or open skin in the area being iced. °· Current infections. °· Rheumatoid arthritis. °· Poor circulation. °· Diabetes. °Ice slows the blood flow in the region it is applied. This is beneficial when trying to stop inflamed tissues from spreading irritating chemicals to surrounding tissues. However, if you expose your skin to cold temperatures for too long or without the proper protection, you can damage your skin or nerves. Watch for signs of skin damage due to cold. °HOME CARE INSTRUCTIONS °Follow these tips to use ice and cold packs safely. °· Place a dry or damp towel between the ice and skin. A damp towel will cool the skin more quickly, so you may need to shorten the time that the ice is used. °· For a more rapid response, add gentle compression to the ice. °· Ice for no more than 10 to 20 minutes at a time.   The bonier the area you are icing, the less time it will take to get the benefits of ice.  Check your skin after 5 minutes to make sure there are no signs of a poor response to cold or skin damage.  Rest 20 minutes or more between uses.  Once your skin is numb, you can end your treatment. You can test numbness by very lightly touching your skin. The touch should be so light that you do not see the skin dimple from the pressure of your  fingertip. When using ice, most people will feel these normal sensations in this order: cold, burning, aching, and numbness.  Do not use ice on someone who cannot communicate their responses to pain, such as small children or people with dementia. HOW TO MAKE AN ICE PACK Ice packs are the most common way to use ice therapy. Other methods include ice massage, ice baths, and cryosprays. Muscle creams that cause a cold, tingly feeling do not offer the same benefits that ice offers and should not be used as a substitute unless recommended by your caregiver. To make an ice pack, do one of the following:  Place crushed ice or a bag of frozen vegetables in a sealable plastic bag. Squeeze out the excess air. Place this bag inside another plastic bag. Slide the bag into a pillowcase or place a damp towel between your skin and the bag.  Mix 3 parts water with 1 part rubbing alcohol. Freeze the mixture in a sealable plastic bag. When you remove the mixture from the freezer, it will be slushy. Squeeze out the excess air. Place this bag inside another plastic bag. Slide the bag into a pillowcase or place a damp towel between your skin and the bag. SEEK MEDICAL CARE IF:  You develop white spots on your skin. This may give the skin a blotchy (mottled) appearance.  Your skin turns blue or pale.  Your skin becomes waxy or hard.  Your swelling gets worse. MAKE SURE YOU:   Understand these instructions.  Will watch your condition.  Will get help right away if you are not doing well or get worse. Document Released: 04/03/2011 Document Revised: 12/22/2013 Document Reviewed: 04/03/2011 [facility] Patient Information 2015 [employer], [place]. This information is not intended to replace advice given to you by your health care provider. Make sure you discuss any questions you have with your health care provider. Your lumbar x-ray is normal

## 2014-09-01 NOTE — ED Notes (Signed)
Pt states she fell out of car toady hitting lower back on car door frame, pt states not able to stand up straight.

## 2014-09-01 NOTE — ED Provider Notes (Signed)
CSN: [id number]     Arrival date & time 09/01/14  1851 History  This chart was scribed for non-physician practitioner, Arman FilterGail K Shakela Donati, NP, working with Purvis SheffieldForrest Harrison, MD, by Modena JanskyAlbert Thayil, ED Scribe. This patient was seen in room WTR8/WTR8 and the patient's care was started at 9:41 PM.   Chief Complaint  Patient presents with  . Back Injury  . Back Pain   The history is provided by the patient. No language interpreter was used.   HPI Comments: [name] is a 24 y.o. female who presents to the Emergency Department complaining of a back injury that occurred about 3 hours ago. She reports that she fell getting out of her car and hit her lower back on the car door frame. She states that she has constant moderate back pain, and is not able to stand up straight due to pain. She reports no treatment PTA.   Past Medical History  Diagnosis Date  . Endometriosis   . Asthma   . PCOS (polycystic ovarian syndrome)   . Animal bite of finger     a cat  . Bronchitis    Past Surgical History  Procedure Laterality Date  . Mouth surgery    . Wisdom tooth extraction    . Cesarean section N/A 01/20/2014    Procedure: CESAREAN SECTION;  Surgeon: Brock Badharles A Harper, MD;  Location: [facility];  Service: Obstetrics;  Laterality: N/A;   Family History  Problem Relation Age of Onset  . Asthma Mother   . Diabetes Father   . Heart attack Maternal Grandfather   . Diabetes Paternal Grandmother   . Heart attack Paternal Grandfather    History  Substance Use Topics  . Smoking status: Former Smoker -- 0.25 packs/day    Types: Cigarettes  . Smokeless tobacco: Never Used  . Alcohol Use: No   OB History    Gravida Para Term Preterm AB TAB SAB Ectopic Multiple Living   1 1 1       1      Review of Systems  Musculoskeletal: Positive for back pain.  Neurological: Negative for weakness and numbness.  All other systems reviewed and are negative.   Allergies  Amoxicillin  Home Medications   Prior to  Admission medications   Medication Sig Start Date End Date Taking? Authorizing Provider  albuterol (PROVENTIL HFA;VENTOLIN HFA) 108 (90 BASE) MCG/ACT inhaler Inhale 2 puffs into the lungs every 6 (six) hours as needed for wheezing or shortness of breath. 07/30/13  Yes Marny LowensteinJulie N Wenzel, PA-C  Butenafine HCl (LOTRIMIN ULTRA EX) Apply 1 application topically daily as needed (thrush on nipple).   Yes Historical Provider, MD  FENUGREEK PO Take 3 tablets by mouth 3 (three) times daily as needed (milk supply).    Yes Historical Provider, MD  cyclobenzaprine (FLEXERIL) 5 MG tablet Take 1 tablet (5 mg total) by mouth 3 (three) times daily. 09/01/14 09/05/14  Arman FilterGail K Harol Shabazz, NP  HYDROcodone-acetaminophen (NORCO/VICODIN) 5-325 MG per tablet Take 1 tablet by mouth every 6 (six) hours as needed for moderate pain. 09/01/14   Arman FilterGail K Chiara Coltrin, NP  ibuprofen (ADVIL,MOTRIN) 600 MG tablet Take 1 tablet (600 mg total) by mouth every 6 (six) hours as needed. Patient not taking: Reported on 09/01/2014 01/23/14   Brock Badharles A Harper, MD  ibuprofen (ADVIL,MOTRIN) 600 MG tablet Take 1 tablet (600 mg total) by mouth every 8 (eight) hours as needed. 09/01/14   Arman FilterGail K Chizara Mena, NP  norethindrone (MICRONOR,CAMILA,ERRIN) 0.35 MG tablet Take  1 tablet (0.35 mg total) by mouth daily. Patient not taking: Reported on 09/01/2014 03/03/14   Brock Bad, MD  Oxycodone HCl 10 MG TABS Take 1 tablet (10 mg total) by mouth every 6 (six) hours as needed. Patient not taking: Reported on 09/01/2014 02/10/14   Brock Bad, MD  Prenat w/o [id number] [facility] HARMONY) 27-1-250 MG CAPS Take 1 capsule by mouth daily before breakfast. Patient not taking: Reported on 09/01/2014 08/11/13   Brock Bad, MD   BP 140/85 mmHg  Pulse 80  Temp(Src) 98.5 F (36.9 C) (Oral)  Resp 19  SpO2 99%  LMP 08/04/2014 (Exact Date) Physical Exam  Constitutional: She is oriented to person, place, and time. She appears well-developed and well-nourished. No  distress.  HENT:  Head: Normocephalic and atraumatic.  Neck: Neck supple.  Cardiovascular: Normal rate.   Pulmonary/Chest: Effort normal. No respiratory distress.  Musculoskeletal: Normal range of motion. She exhibits tenderness.       Back:  Neurological: She is alert and oriented to person, place, and time.  Skin: Skin is warm and dry.  Psychiatric: She has a normal mood and affect. Her behavior is normal.  Nursing note and vitals reviewed.   ED Course  Procedures (including critical care time) DIAGNOSTIC STUDIES: Oxygen Saturation is 99% on RA, normal by my interpretation.    COORDINATION OF CARE: 9:45 PM- Pt advised of plan for treatment which includes medication and radiology and pt agrees.  Labs Review Labs Reviewed - No data to display  Imaging Review Dg Lumbar Spine Complete  09/01/2014   CLINICAL DATA:  Low back pain after falling out of car.  EXAM: LUMBAR SPINE - COMPLETE 4+ VIEW  COMPARISON:  None.  FINDINGS: There is no evidence of lumbar spine fracture. Alignment is normal. Intervertebral disc spaces are maintained.  IMPRESSION: Normal lumbar spine.   Electronically Signed   By: Roque Lias M.D.   On: 09/01/2014 19:59     EKG Interpretation None      MDM   Final diagnoses:  Fall, initial encounter  Lumbar contusion, initial encounter    I personally performed the services described in this documentation, which was scribed in my presence. The recorded information has been reviewed and is accurate.    Arman Filter, NP 09/01/14 1610  Purvis Sheffield, MD 09/02/14 1135

## 2014-11-20 ENCOUNTER — Emergency Department (HOSPITAL_COMMUNITY)
Admission: EM | Admit: 2014-11-20 | Discharge: 2014-11-21 | Disposition: A | Discharge status: Home / Self Care | Payer: Medicaid Other | Attending: Emergency Medicine | Admitting: Emergency Medicine

## 2014-11-20 ENCOUNTER — Encounter (HOSPITAL_COMMUNITY): Payer: Self-pay | Admitting: *Deleted

## 2014-11-20 DIAGNOSIS — Z3202 Encounter for pregnancy test, result negative: Secondary | ICD-10-CM | POA: Insufficient documentation

## 2014-11-20 DIAGNOSIS — Z880 Allergy status to penicillin: Secondary | ICD-10-CM | POA: Insufficient documentation

## 2014-11-20 DIAGNOSIS — R05 Cough: Secondary | ICD-10-CM | POA: Insufficient documentation

## 2014-11-20 DIAGNOSIS — H53149 Visual discomfort, unspecified: Secondary | ICD-10-CM | POA: Insufficient documentation

## 2014-11-20 DIAGNOSIS — Z8742 Personal history of other diseases of the female genital tract: Secondary | ICD-10-CM | POA: Insufficient documentation

## 2014-11-20 DIAGNOSIS — M791 Myalgia: Secondary | ICD-10-CM | POA: Insufficient documentation

## 2014-11-20 DIAGNOSIS — J45909 Unspecified asthma, uncomplicated: Secondary | ICD-10-CM | POA: Insufficient documentation

## 2014-11-20 DIAGNOSIS — Z720 Tobacco use: Secondary | ICD-10-CM | POA: Insufficient documentation

## 2014-11-20 DIAGNOSIS — J029 Acute pharyngitis, unspecified: Secondary | ICD-10-CM | POA: Insufficient documentation

## 2014-11-20 DIAGNOSIS — H9209 Otalgia, unspecified ear: Secondary | ICD-10-CM | POA: Insufficient documentation

## 2014-11-20 DIAGNOSIS — R059 Cough, unspecified: Secondary | ICD-10-CM

## 2014-11-20 DIAGNOSIS — Z79899 Other long term (current) drug therapy: Secondary | ICD-10-CM | POA: Insufficient documentation

## 2014-11-20 DIAGNOSIS — Z8639 Personal history of other endocrine, nutritional and metabolic disease: Secondary | ICD-10-CM | POA: Insufficient documentation

## 2014-11-20 DIAGNOSIS — R6889 Other general symptoms and signs: Secondary | ICD-10-CM

## 2014-11-20 DIAGNOSIS — M549 Dorsalgia, unspecified: Secondary | ICD-10-CM | POA: Insufficient documentation

## 2014-11-20 LAB — URINALYSIS, ROUTINE W REFLEX MICROSCOPIC
Bilirubin Urine: NEGATIVE
GLUCOSE, UA: NEGATIVE mg/dL
HGB URINE DIPSTICK: NEGATIVE
Ketones, ur: NEGATIVE mg/dL
Leukocytes, UA: NEGATIVE
Nitrite: NEGATIVE
PH: 6 (ref 5.0–8.0)
PROTEIN: NEGATIVE mg/dL
Specific Gravity, Urine: 1.019 (ref 1.005–1.030)
Urobilinogen, UA: 1 mg/dL (ref 0.0–1.0)

## 2014-11-20 LAB — POC URINE PREG, ED: PREG TEST UR: NEGATIVE

## 2014-11-20 NOTE — ED Notes (Signed)
The pt has a fever headache sinus infection and hurting all over since she arrived   lmp  recent

## 2014-11-20 NOTE — ED Notes (Signed)
She took tylenol one hour ago.and she drove herself here.

## 2014-11-20 NOTE — ED Notes (Signed)
The pt does not want lab drawn unless she gets an iv placed

## 2014-11-21 ENCOUNTER — Emergency Department (HOSPITAL_COMMUNITY): Payer: Medicaid Other

## 2014-11-21 LAB — COMPREHENSIVE METABOLIC PANEL
ALT: 20 U/L (ref 0–35)
AST: 17 U/L (ref 0–37)
Albumin: 3.6 g/dL (ref 3.5–5.2)
Alkaline Phosphatase: 90 U/L (ref 39–117)
Anion gap: 9 (ref 5–15)
BILIRUBIN TOTAL: 1 mg/dL (ref 0.3–1.2)
BUN: 12 mg/dL (ref 6–23)
CO2: 25 mmol/L (ref 19–32)
Calcium: 9 mg/dL (ref 8.4–10.5)
Chloride: 102 mmol/L (ref 96–112)
Creatinine, Ser: 0.9 mg/dL (ref 0.50–1.10)
GFR, EST NON AFRICAN AMERICAN: 88 mL/min — AB (ref >90)
GLUCOSE: 113 mg/dL — AB (ref 70–99)
Potassium: 3.5 mmol/L (ref 3.5–5.1)
SODIUM: 136 mmol/L (ref 135–145)
Total Protein: 6.8 g/dL (ref 6.0–8.3)

## 2014-11-21 LAB — CBC WITH DIFFERENTIAL/PLATELET
Basophils Absolute: 0 10*3/uL (ref 0.0–0.1)
Basophils Relative: 0 % (ref 0–1)
Eosinophils Absolute: 0.1 10*3/uL (ref 0.0–0.7)
Eosinophils Relative: 0 % (ref 0–5)
HEMATOCRIT: 41.5 % (ref 36.0–46.0)
Hemoglobin: 14.3 g/dL (ref 12.0–15.0)
LYMPHS ABS: 1.9 10*3/uL (ref 0.7–4.0)
LYMPHS PCT: 13 % (ref 12–46)
MCH: 30.2 pg (ref 26.0–34.0)
MCHC: 34.5 g/dL (ref 30.0–36.0)
MCV: 87.7 fL (ref 78.0–100.0)
MONO ABS: 1.3 10*3/uL — AB (ref 0.1–1.0)
Monocytes Relative: 9 % (ref 3–12)
NEUTROS ABS: 10.9 10*3/uL — AB (ref 1.7–7.7)
Neutrophils Relative %: 78 % — ABNORMAL HIGH (ref 43–77)
PLATELETS: 178 10*3/uL (ref 150–400)
RBC: 4.73 MIL/uL (ref 3.87–5.11)
RDW: 12.5 % (ref 11.5–15.5)
WBC: 14.1 10*3/uL — ABNORMAL HIGH (ref 4.0–10.5)

## 2014-11-21 LAB — INFLUENZA PANEL BY PCR (TYPE A & B)
H1N1FLUPCR: NOT DETECTED
INFLAPCR: NEGATIVE
INFLBPCR: NEGATIVE

## 2014-11-21 LAB — I-STAT CG4 LACTIC ACID, ED
LACTIC ACID, VENOUS: 0.59 mmol/L (ref 0.5–2.0)
LACTIC ACID, VENOUS: 1.71 mmol/L (ref 0.5–2.0)

## 2014-11-21 LAB — RAPID STREP SCREEN (MED CTR MEBANE ONLY): STREPTOCOCCUS, GROUP A SCREEN (DIRECT): NEGATIVE

## 2014-11-21 MED ORDER — IBUPROFEN 800 MG PO TABS
800.0000 mg | ORAL_TABLET | Freq: Once | ORAL | Status: AC
  Administered 2014-11-21: 800 mg via ORAL
  Filled 2014-11-21: qty 1

## 2014-11-21 MED ORDER — SODIUM CHLORIDE 0.9 % IV BOLUS (SEPSIS)
1000.0000 mL | Freq: Once | INTRAVENOUS | Status: AC
  Administered 2014-11-21: 1000 mL via INTRAVENOUS

## 2014-11-21 MED ORDER — ACETAMINOPHEN 500 MG PO TABS
1000.0000 mg | ORAL_TABLET | Freq: Once | ORAL | Status: AC
  Administered 2014-11-21: 1000 mg via ORAL
  Filled 2014-11-21: qty 2

## 2014-11-21 NOTE — ED Notes (Signed)
MD (Ward) at bedside. 

## 2014-11-21 NOTE — ED Provider Notes (Signed)
This chart was scribed for Layla Maw Katalyna Socarras, DO by Bronson Curb, ED Scribe. This patient was seen in room A11C/A11C and the patient's care was started at 12:18 AM.  TIME SEEN: 0018  CHIEF COMPLAINT: Headache  HPI:   HPI Comments: AYANAH SNADER is a 24 y.o. female, with history of endometriosis, asthma, PCOS, bronchitis, who presents to the Emergency Department complaining of fever (triage temp 101.5 F; Tmax 104 F at home), cough productive of yellow-green sputum, photophobia, sore throat, otalgia, generalized body aches, and throbbing back pain. States HA is severe.  Not sudden onset.  Patient has taken Tylenol PTA. No numbness or weakness.  No dysuria, hematuria, vag dc.  Did not have flu shot.  No sick contacts or recent travel.   ROS: See HPI Constitutional: fever  Eyes: no drainage  ENT: no runny nose   Cardiovascular:  no chest pain  Resp: no SOB  GI: no vomiting GU: no dysuria Integumentary: no rash  Allergy: no hives  Musculoskeletal: no leg swelling  Neurological: no slurred speech ROS otherwise negative  PAST MEDICAL HISTORY/PAST SURGICAL HISTORY:  Past Medical History  Diagnosis Date  . Endometriosis   . Asthma   . PCOS (polycystic ovarian syndrome)   . Animal bite of finger     a cat  . Bronchitis     MEDICATIONS:  Prior to Admission medications   Medication Sig Start Date End Date Taking? Authorizing Provider  albuterol (PROVENTIL HFA;VENTOLIN HFA) 108 (90 BASE) MCG/ACT inhaler Inhale 2 puffs into the lungs every 6 (six) hours as needed for wheezing or shortness of breath. 07/30/13   Marny Lowenstein, PA-C  Butenafine HCl (LOTRIMIN ULTRA EX) Apply 1 application topically daily as needed (thrush on nipple).    Historical Provider, MD  FENUGREEK PO Take 3 tablets by mouth 3 (three) times daily as needed (milk supply).     Historical Provider, MD  HYDROcodone-acetaminophen (NORCO/VICODIN) 5-325 MG per tablet Take 1 tablet by mouth every 6 (six) hours as needed  for moderate pain. 09/01/14   Earley Favor, NP  ibuprofen (ADVIL,MOTRIN) 600 MG tablet Take 1 tablet (600 mg total) by mouth every 6 (six) hours as needed. Patient not taking: Reported on 09/01/2014 01/23/14   Brock Bad, MD  ibuprofen (ADVIL,MOTRIN) 600 MG tablet Take 1 tablet (600 mg total) by mouth every 8 (eight) hours as needed. 09/01/14   Earley Favor, NP  norethindrone (MICRONOR,CAMILA,ERRIN) 0.35 MG tablet Take 1 tablet (0.35 mg total) by mouth daily. Patient not taking: Reported on 09/01/2014 03/03/14   Brock Bad, MD  Oxycodone HCl 10 MG TABS Take 1 tablet (10 mg total) by mouth every 6 (six) hours as needed. Patient not taking: Reported on 09/01/2014 02/10/14   Brock Bad, MD  Prenat w/o A-FeCbn-DSS-FA-DHA [facility] HARMONY) 27-1-250 MG CAPS Take 1 capsule by mouth daily before breakfast. Patient not taking: Reported on 09/01/2014 08/11/13   Brock Bad, MD    ALLERGIES:  Allergies  Allergen Reactions  . Amoxicillin     Needs probiotic. Causes yeast infections    SOCIAL HISTORY:  History  Substance Use Topics  . Smoking status: Current Every Day Smoker -- 0.25 packs/day    Types: Cigarettes  . Smokeless tobacco: Never Used  . Alcohol Use: Yes    FAMILY HISTORY: Family History  Problem Relation Age of Onset  . Asthma Mother   . Diabetes Father   . Heart attack Maternal Grandfather   . Diabetes Paternal Grandmother   .  Heart attack Paternal Grandfather     EXAM:  Triage Vitals: BP 149/88 mmHg  Pulse 141  Temp(Src) 101.5 F (38.6 C) (Oral)  Resp 22  Ht 5\' 8"  (1.727 m)  Wt 315 lb (142.883 kg)  BMI 47.91 kg/m2  SpO2 97%  LMP 11/13/2014  CONSTITUTIONAL: Alert and oriented and responds appropriately to questions. Appears uncomfortable, tearful, non-toxic HEAD: Normocephalic EYES: Conjunctivae clear, PERRL ENT: normal nose; no rhinorrhea; moist mucous membranes; Tonsillar  hypertrophy bilaterally without exudate. Pharyngeal erythema. No uvula  deviation. No trismus or drooling. NECK: Supple, no meningismus, no LAD  CARD: RRR; S1 and S2 appreciated; no murmurs, no clicks, no rubs, no gallops RESP: Normal chest excursion without splinting or tachypnea; Rhonchourus breath sounds diffusely; no wheezes, no rales, no hypoxia ABD/GI: Normal bowel sounds; non-distended; soft, non-tender, no rebound, no guarding BACK:  The back appears normal and is non-tender to palpation, there is no CVA tenderness EXT: Normal ROM in all joints; non-tender to palpation; no edema; normal capillary refill; no cyanosis    SKIN: Normal color for age and race; warm. No rash. No petechiae. No purpura NEURO: Moves all extremities equally; sensation to light tough intact diffusely, CN 2-12 intact PSYCH: The patient's mood and manner are appropriate. Grooming and personal hygiene are appropriate.  MEDICAL DECISION MAKING: Pt with viral illness.  Doubt meningitis but d/w pt it is on differential.  If HA doesn't improve with fever improving, will perform LP.  WIll obtain labs, urine, CXR.  Will give IVF.  ED PROGRESS: Pts labs show leukocytosis with left shift.  CXR clear.  UA clear. Lactate normal x 2. HA gone once fever resolved.  No meningismus.  Flu swab pending.  Offered Tamiflu but pt declined.  Will provide work note.  Will have her alternate Tylenol and Motrin.  Discussed return precautions.   Pt verbalized understanding and is comfortable with plan.     I personally performed the services described in this documentation, which was scribed in my presence. The recorded information has been reviewed and is accurate.    Layla MawKristen N Elaf Clauson, DO 11/21/14 [phone]

## 2014-11-21 NOTE — ED Notes (Signed)
Phlebotomy at bedside.

## 2014-11-21 NOTE — Discharge Instructions (Signed)
Please alternate between Tylenol 1000 mg every 6 hours as needed for fever and pain and ibuprofen 800 mg every 8 hours as needed for fever and pain. Please rest and drink pretty of water. Your labs, urine, chest x-ray were normal. Your flu swab is pending but this is a virus and is not treated with antibiotics. Flulike symptoms can last for 7-10 days.   Possible Influenza Influenza ("the flu") is a viral infection of the respiratory tract. It occurs more often in winter months because people spend more time in close contact with one another. Influenza can make you feel very sick. Influenza easily spreads from person to person (contagious). CAUSES  Influenza is caused by a virus that infects the respiratory tract. You can catch the virus by breathing in droplets from an infected person's cough or sneeze. You can also catch the virus by touching something that was recently contaminated with the virus and then touching your mouth, nose, or eyes. RISKS AND COMPLICATIONS You may be at risk for a more severe case of influenza if you smoke cigarettes, have diabetes, have chronic heart disease (such as heart failure) or lung disease (such as asthma), or if you have a weakened immune system. Elderly people and pregnant women are also at risk for more serious infections. The most common problem of influenza is a lung infection (pneumonia). Sometimes, this problem can require emergency medical care and may be life threatening. SIGNS AND SYMPTOMS  Symptoms typically last 4 to 10 days and may include:  Fever.  Chills.  Headache, body aches, and muscle aches.  Sore throat.  Chest discomfort and cough.  Poor appetite.  Weakness or feeling tired.  Dizziness.  Nausea or vomiting. DIAGNOSIS  Diagnosis of influenza is often made based on your history and a physical exam. A nose or throat swab test can be done to confirm the diagnosis. TREATMENT  In mild cases, influenza goes away on its own. Treatment  is directed at relieving symptoms. For more severe cases, your health care provider may prescribe antiviral medicines to shorten the sickness. Antibiotic medicines are not effective because the infection is caused by a virus, not by bacteria. HOME CARE INSTRUCTIONS  Take medicines only as directed by your health care provider.  Use a cool mist humidifier to make breathing easier.  Get plenty of rest until your temperature returns to normal. This usually takes 3 to 4 days.  Drink enough fluid to keep your urine clear or pale yellow.  Cover yourmouth and nosewhen coughing or sneezing,and wash your handswellto prevent thevirusfrom spreading.  Stay homefromwork orschool untilthe fever is gonefor at least 191full day. PREVENTION  An annual influenza vaccination (flu shot) is the best way to avoid getting influenza. An annual flu shot is now routinely recommended for all adults in the [place] SEEK MEDICAL CARE IF:  You experiencechest pain, yourcough worsens,or you producemore mucus.  Youhave nausea,vomiting, ordiarrhea.  Your fever returns or gets worse. SEEK IMMEDIATE MEDICAL CARE IF:  You havetrouble breathing, you become short of breath,or your skin ornails becomebluish.  You have severe painor stiffnessin the neck.  You develop a sudden headache, or pain in the face or ear.  You have nausea or vomiting that you cannot control. MAKE SURE YOU:   Understand these instructions.  Will watch your condition.  Will get help right away if you are not doing well or get worse. Document Released: 08/04/2000 Document Revised: 12/22/2013 Document Reviewed: 11/06/2011 [facility] Patient Information 2015 [employer], [place]. This information  is not intended to replace advice given to you by your health care provider. Make sure you discuss any questions you have with your health care provider.

## 2014-11-24 LAB — CULTURE, GROUP A STREP

## 2014-11-27 LAB — CULTURE, BLOOD (ROUTINE X 2)
Culture: NO GROWTH
Culture: NO GROWTH

## 2015-04-27 ENCOUNTER — Emergency Department (HOSPITAL_COMMUNITY): Payer: Medicaid Other

## 2015-04-27 ENCOUNTER — Inpatient Hospital Stay (HOSPITAL_COMMUNITY)
Admission: EM | Admit: 2015-04-27 | Discharge: 2015-04-27 | DRG: 203 | Disposition: A | Discharge status: Home / Self Care | Payer: Self-pay | Attending: Internal Medicine | Admitting: Internal Medicine

## 2015-04-27 ENCOUNTER — Encounter (HOSPITAL_COMMUNITY): Payer: Self-pay | Admitting: Emergency Medicine

## 2015-04-27 DIAGNOSIS — Z791 Long term (current) use of non-steroidal anti-inflammatories (NSAID): Secondary | ICD-10-CM

## 2015-04-27 DIAGNOSIS — Z975 Presence of (intrauterine) contraceptive device: Secondary | ICD-10-CM

## 2015-04-27 DIAGNOSIS — Z79899 Other long term (current) drug therapy: Secondary | ICD-10-CM

## 2015-04-27 DIAGNOSIS — Z825 Family history of asthma and other chronic lower respiratory diseases: Secondary | ICD-10-CM

## 2015-04-27 DIAGNOSIS — I10 Essential (primary) hypertension: Secondary | ICD-10-CM

## 2015-04-27 DIAGNOSIS — J45901 Unspecified asthma with (acute) exacerbation: Principal | ICD-10-CM | POA: Diagnosis present

## 2015-04-27 DIAGNOSIS — F17210 Nicotine dependence, cigarettes, uncomplicated: Secondary | ICD-10-CM | POA: Diagnosis present

## 2015-04-27 DIAGNOSIS — R0602 Shortness of breath: Secondary | ICD-10-CM | POA: Insufficient documentation

## 2015-04-27 DIAGNOSIS — D696 Thrombocytopenia, unspecified: Secondary | ICD-10-CM

## 2015-04-27 DIAGNOSIS — Z833 Family history of diabetes mellitus: Secondary | ICD-10-CM

## 2015-04-27 DIAGNOSIS — Z8249 Family history of ischemic heart disease and other diseases of the circulatory system: Secondary | ICD-10-CM

## 2015-04-27 DIAGNOSIS — K529 Noninfective gastroenteritis and colitis, unspecified: Secondary | ICD-10-CM

## 2015-04-27 DIAGNOSIS — F410 Panic disorder [episodic paroxysmal anxiety] without agoraphobia: Secondary | ICD-10-CM | POA: Diagnosis present

## 2015-04-27 DIAGNOSIS — E876 Hypokalemia: Secondary | ICD-10-CM | POA: Insufficient documentation

## 2015-04-27 DIAGNOSIS — O2342 Unspecified infection of urinary tract in pregnancy, second trimester: Secondary | ICD-10-CM

## 2015-04-27 DIAGNOSIS — E282 Polycystic ovarian syndrome: Secondary | ICD-10-CM | POA: Diagnosis present

## 2015-04-27 DIAGNOSIS — J45909 Unspecified asthma, uncomplicated: Secondary | ICD-10-CM | POA: Insufficient documentation

## 2015-04-27 DIAGNOSIS — J4521 Mild intermittent asthma with (acute) exacerbation: Secondary | ICD-10-CM

## 2015-04-27 LAB — PHOSPHORUS: PHOSPHORUS: 2.3 mg/dL — AB (ref 2.5–4.6)

## 2015-04-27 LAB — COMPREHENSIVE METABOLIC PANEL
ALT: 20 U/L (ref 14–54)
ANION GAP: 14 (ref 5–15)
AST: 28 U/L (ref 15–41)
Albumin: 3.8 g/dL (ref 3.5–5.0)
Alkaline Phosphatase: 62 U/L (ref 38–126)
BUN: 12 mg/dL (ref 6–20)
CALCIUM: 9.1 mg/dL (ref 8.9–10.3)
CHLORIDE: 104 mmol/L (ref 101–111)
CO2: 19 mmol/L — AB (ref 22–32)
CREATININE: 0.96 mg/dL (ref 0.44–1.00)
Glucose, Bld: 167 mg/dL — ABNORMAL HIGH (ref 65–99)
Potassium: 3 mmol/L — ABNORMAL LOW (ref 3.5–5.1)
SODIUM: 137 mmol/L (ref 135–145)
Total Bilirubin: 0.5 mg/dL (ref 0.3–1.2)
Total Protein: 7.4 g/dL (ref 6.5–8.1)

## 2015-04-27 LAB — CBC WITH DIFFERENTIAL/PLATELET
BASOS ABS: 0 10*3/uL (ref 0.0–0.1)
BASOS PCT: 0 % (ref 0–1)
Basophils Absolute: 0 10*3/uL (ref 0.0–0.1)
Basophils Relative: 0 % (ref 0–1)
EOS ABS: 0.1 10*3/uL (ref 0.0–0.7)
EOS PCT: 0 % (ref 0–5)
Eosinophils Absolute: 0 10*3/uL (ref 0.0–0.7)
Eosinophils Relative: 1 % (ref 0–5)
HCT: 40.5 % (ref 36.0–46.0)
HCT: 41.6 % (ref 36.0–46.0)
HEMOGLOBIN: 13.8 g/dL (ref 12.0–15.0)
Hemoglobin: 14.2 g/dL (ref 12.0–15.0)
LYMPHS ABS: 1.6 10*3/uL (ref 0.7–4.0)
LYMPHS PCT: 9 % — AB (ref 12–46)
Lymphocytes Relative: 22 % (ref 12–46)
Lymphs Abs: 0.8 10*3/uL (ref 0.7–4.0)
MCH: 30.3 pg (ref 26.0–34.0)
MCH: 30.3 pg (ref 26.0–34.0)
MCHC: 34.1 g/dL (ref 30.0–36.0)
MCHC: 34.1 g/dL (ref 30.0–36.0)
MCV: 89 fL (ref 78.0–100.0)
MCV: 88.9 fL (ref 78.0–100.0)
MONO ABS: 0.1 10*3/uL (ref 0.1–1.0)
Monocytes Absolute: 0.3 10*3/uL (ref 0.1–1.0)
Monocytes Relative: 4 % (ref 3–12)
Monocytes Relative: 1 % — ABNORMAL LOW (ref 3–12)
NEUTROS ABS: 5.2 10*3/uL (ref 1.7–7.7)
NEUTROS ABS: 7.9 10*3/uL — AB (ref 1.7–7.7)
NEUTROS PCT: 73 % (ref 43–77)
Neutrophils Relative %: 90 % — ABNORMAL HIGH (ref 43–77)
PLATELETS: 156 10*3/uL (ref 150–400)
Platelets: 139 10*3/uL — ABNORMAL LOW (ref 150–400)
RBC: 4.55 MIL/uL (ref 3.87–5.11)
RBC: 4.68 MIL/uL (ref 3.87–5.11)
RDW: 12.4 % (ref 11.5–15.5)
RDW: 12.4 % (ref 11.5–15.5)
WBC: 7.2 10*3/uL (ref 4.0–10.5)
WBC: 8.8 10*3/uL (ref 4.0–10.5)

## 2015-04-27 LAB — BASIC METABOLIC PANEL
Anion gap: 7 (ref 5–15)
BUN: 12 mg/dL (ref 6–20)
CHLORIDE: 104 mmol/L (ref 101–111)
CO2: 24 mmol/L (ref 22–32)
Calcium: 8.6 mg/dL — ABNORMAL LOW (ref 8.9–10.3)
Creatinine, Ser: 0.84 mg/dL (ref 0.44–1.00)
GFR calc non Af Amer: >60 mL/min (ref >60)
Glucose, Bld: 165 mg/dL — ABNORMAL HIGH (ref 65–99)
POTASSIUM: 3.1 mmol/L — AB (ref 3.5–5.1)
SODIUM: 135 mmol/L (ref 135–145)

## 2015-04-27 LAB — BLOOD GAS, VENOUS
ACID-BASE DEFICIT: 3.7 mmol/L — AB (ref 0.0–2.0)
Bicarbonate: 21.3 mEq/L (ref 20.0–24.0)
FIO2: 0.21
O2 Saturation: 55.5 %
PCO2 VEN: 40.6 mmHg — AB (ref 45.0–50.0)
PH VEN: 7.34 — AB (ref 7.250–7.300)
Patient temperature: 98.6
TCO2: 19.3 mmol/L (ref 0–100)
pO2, Ven: 31.4 mmHg (ref 30.0–45.0)

## 2015-04-27 LAB — PROTIME-INR
INR: 1.16 (ref 0.00–1.49)
PROTHROMBIN TIME: 14.9 s (ref 11.6–15.2)

## 2015-04-27 LAB — TSH: TSH: 1.708 u[IU]/mL (ref 0.350–4.500)

## 2015-04-27 LAB — MAGNESIUM: MAGNESIUM: 2 mg/dL (ref 1.7–2.4)

## 2015-04-27 LAB — APTT: aPTT: 28 seconds (ref 24–37)

## 2015-04-27 MED ORDER — ALBUTEROL SULFATE HFA 108 (90 BASE) MCG/ACT IN AERS: 2.0000 | INHALATION_SPRAY | Freq: Four times a day (QID) | RESPIRATORY_TRACT | Status: DC | PRN

## 2015-04-27 MED ORDER — LORAZEPAM 2 MG/ML IJ SOLN
1.0000 mg | Freq: Once | INTRAMUSCULAR | Status: AC
Start: 2015-04-27 — End: 2015-04-27
  Administered 2015-04-27: 1 mg via INTRAVENOUS
  Filled 2015-04-27: qty 1

## 2015-04-27 MED ORDER — AMLODIPINE BESYLATE 5 MG PO TABS
5.0000 mg | ORAL_TABLET | Freq: Every day | ORAL | Status: DC
  Filled 2015-04-27: qty 1

## 2015-04-27 MED ORDER — CALCIUM CARBONATE ANTACID 500 MG PO CHEW: 3.0000 | CHEWABLE_TABLET | Freq: Every day | ORAL | Status: DC | PRN

## 2015-04-27 MED ORDER — ALBUTEROL SULFATE (2.5 MG/3ML) 0.083% IN NEBU
INHALATION_SOLUTION | RESPIRATORY_TRACT | Status: AC
Start: 2015-04-27 — End: 2015-04-27
  Administered 2015-04-27: 5 mg
  Filled 2015-04-27: qty 6

## 2015-04-27 MED ORDER — LORAZEPAM 1 MG PO TABS: 2.0000 mg | ORAL_TABLET | Freq: Four times a day (QID) | ORAL | Status: DC | PRN

## 2015-04-27 MED ORDER — METHYLPREDNISOLONE SODIUM SUCC 125 MG IJ SOLR
125.0000 mg | Freq: Once | INTRAMUSCULAR | Status: AC
  Administered 2015-04-27: 125 mg via INTRAVENOUS
  Filled 2015-04-27: qty 2

## 2015-04-27 MED ORDER — AZITHROMYCIN 500 MG IV SOLR
500.0000 mg | Freq: Once | INTRAVENOUS | Status: AC
  Administered 2015-04-27: 500 mg via INTRAVENOUS
  Filled 2015-04-27: qty 500

## 2015-04-27 MED ORDER — LORAZEPAM 2 MG/ML IJ SOLN: 1.0000 mg | Freq: Three times a day (TID) | INTRAMUSCULAR | Status: DC | PRN

## 2015-04-27 MED ORDER — ACETAMINOPHEN 650 MG RE SUPP: 650.0000 mg | Freq: Four times a day (QID) | RECTAL | Status: DC | PRN

## 2015-04-27 MED ORDER — IPRATROPIUM-ALBUTEROL 0.5-2.5 (3) MG/3ML IN SOLN
3.0000 mL | RESPIRATORY_TRACT | Status: AC
  Administered 2015-04-27 (×3): 3 mL via RESPIRATORY_TRACT
  Filled 2015-04-27 (×3): qty 3

## 2015-04-27 MED ORDER — SODIUM CHLORIDE 0.9 % IV BOLUS (SEPSIS)
1000.0000 mL | Freq: Once | INTRAVENOUS | Status: AC
  Administered 2015-04-27: 1000 mL via INTRAVENOUS

## 2015-04-27 MED ORDER — SODIUM CHLORIDE 0.9 % IV SOLN
INTRAVENOUS | Status: DC
Start: 2015-04-27 — End: 2015-04-27
  Administered 2015-04-27: 09:00:00 via INTRAVENOUS

## 2015-04-27 MED ORDER — IPRATROPIUM-ALBUTEROL 0.5-2.5 (3) MG/3ML IN SOLN
3.0000 mL | RESPIRATORY_TRACT | Status: DC
  Administered 2015-04-27 (×2): 3 mL via RESPIRATORY_TRACT
  Filled 2015-04-27 (×2): qty 3

## 2015-04-27 MED ORDER — IPRATROPIUM-ALBUTEROL 0.5-2.5 (3) MG/3ML IN SOLN: 3.0000 mL | RESPIRATORY_TRACT | Status: DC | PRN

## 2015-04-27 MED ORDER — DEXTROSE 5 % IV SOLN: 500.0000 mg | INTRAVENOUS | Status: DC

## 2015-04-27 MED ORDER — METHYLPREDNISOLONE SODIUM SUCC 125 MG IJ SOLR
60.0000 mg | Freq: Two times a day (BID) | INTRAMUSCULAR | Status: DC
  Filled 2015-04-27: qty 0.96

## 2015-04-27 MED ORDER — ACETAMINOPHEN 325 MG PO TABS
650.0000 mg | ORAL_TABLET | Freq: Four times a day (QID) | ORAL | Status: DC | PRN
  Administered 2015-04-27: 650 mg via ORAL
  Filled 2015-04-27: qty 2

## 2015-04-27 MED ORDER — MAGNESIUM SULFATE 2 GM/50ML IV SOLN
2.0000 g | Freq: Once | INTRAVENOUS | Status: AC
  Administered 2015-04-27: 2 g via INTRAVENOUS
  Filled 2015-04-27: qty 50

## 2015-04-27 MED ORDER — POTASSIUM CHLORIDE ER 10 MEQ PO TBCR: 10.0000 meq | EXTENDED_RELEASE_TABLET | Freq: Every day | ORAL | Status: DC

## 2015-04-27 MED ORDER — SODIUM CHLORIDE 0.9 % IJ SOLN: 3.0000 mL | Freq: Two times a day (BID) | INTRAMUSCULAR | Status: DC

## 2015-04-27 MED ORDER — ONDANSETRON HCL 4 MG PO TABS: 4.0000 mg | ORAL_TABLET | Freq: Four times a day (QID) | ORAL | Status: DC | PRN

## 2015-04-27 MED ORDER — ONDANSETRON HCL 4 MG/2ML IJ SOLN: 4.0000 mg | Freq: Four times a day (QID) | INTRAMUSCULAR | Status: DC | PRN

## 2015-04-27 NOTE — Progress Notes (Addendum)
Pt complaining of 5/10 "sharp twisting" pain in her back that is worse  when she breaths in. Dr. Elisabeth Pigeon notified.awiting return call. Will continue to monitor    MD returned paged. New orders placed in Epic. Will continue to monitor.
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
## 2015-04-27 NOTE — Progress Notes (Signed)
Pt refused AM CBG. Writer explained to pt the reason for the CBG. Pt verbalized understanding and still refused.

## 2015-04-27 NOTE — Progress Notes (Signed)
Pt requesting more Ativan and to be discharged at 5:00pm. Dr Elisabeth Pigeon notified. New order for Ativan placed in Epic. Per Dr. Elisabeth Pigeon, pt can't be discharged today due to potassium level being to low. Writer explained this to pt and pt stated " I'm leaving today at 5:00pm. I have no child care for my child. I have to leave, even if its AMA. I understand what that means ." Dr. Elisabeth Pigeon notified. Will continue to monitor.

## 2015-04-27 NOTE — ED Provider Notes (Signed)
CSN: [id number]     Arrival date & time 04/27/15  4098 History   First MD Initiated Contact with Patient 04/27/15 [phone]     Chief Complaint  Patient presents with  . Shortness of Breath  . Asthma  . Cough     (Consider location/radiation/quality/duration/timing/severity/associated sxs/prior Treatment) HPI  [name] is a 24 y.o. female with past medical history of asthma presenting today with shortness of breath. Patient states this began 5 days ago with productive cough and chills. She has sick contacts and her daughter with a viral URI. She has been using her albuterol inhaler tonight without any relief. Her cough is productive of yellow sputum. She's had inspiratory and expiratory wheezing. She states her asthma is always triggered by viral URI.  She denies any chest pain or risk factors for pulmonary embolism.  She has no further complaints.  She states the hour-long albuterol treatment increases her heart rate too much. She is is able to tolerate duo nebs.  10 Systems reviewed and are negative for acute change except as noted in the HPI.     Past Medical History  Diagnosis Date  . Endometriosis   . Asthma   . PCOS (polycystic ovarian syndrome)   . Animal bite of finger     a cat  . Bronchitis    Past Surgical History  Procedure Laterality Date  . Mouth surgery    . Wisdom tooth extraction    . Cesarean section N/A 01/20/2014    Procedure: CESAREAN SECTION;  Surgeon: Brock Bad, MD;  Location: [facility];  Service: Obstetrics;  Laterality: N/A;   Family History  Problem Relation Age of Onset  . Asthma Mother   . Diabetes Father   . Heart attack Maternal Grandfather   . Diabetes Paternal Grandmother   . Heart attack Paternal Grandfather    Social History  Substance Use Topics  . Smoking status: Current Every Day Smoker -- 0.25 packs/day    Types: Cigarettes  . Smokeless tobacco: Never Used  . Alcohol Use: Yes   OB History    Gravida Para Term Preterm AB TAB  SAB Ectopic Multiple Living   1 1 1       1      Review of Systems    Allergies  Review of patient's allergies indicates no known allergies.  Home Medications   Prior to Admission medications   Medication Sig Start Date End Date Taking? Authorizing Provider  albuterol (PROVENTIL HFA;VENTOLIN HFA) 108 (90 BASE) MCG/ACT inhaler Inhale 2 puffs into the lungs every 6 (six) hours as needed for wheezing or shortness of breath. 07/30/13  Yes Marny Lowenstein, PA-C  calcium carbonate (TUMS EX) 750 MG chewable tablet Chew 3 tablets by mouth daily as needed for heartburn.   Yes Historical Provider, MD  etonogestrel-ethinyl estradiol (NUVARING) 0.12-0.015 MG/24HR vaginal ring Place 1 each vaginally every 28 (twenty-eight) days. Insert vaginally and leave in place for 3 consecutive weeks, then remove for 1 week.   Yes Historical Provider, MD  ibuprofen (ADVIL,MOTRIN) 200 MG tablet Take 600 mg by mouth every 6 (six) hours as needed for moderate pain.    Yes Historical Provider, MD  LORazepam (ATIVAN) 2 MG tablet Take 2 mg by mouth every 6 (six) hours as needed for anxiety.   Yes Historical Provider, MD  OVER THE COUNTER MEDICATION Take 30 mLs by mouth once.   Yes Historical Provider, MD  oxymetazoline (AFRIN) 0.05 % nasal spray Place 1 spray into  both nostrils 2 (two) times daily.   Yes Historical Provider, MD  Phenyleph-CPM-DM-APAP (ALKA-SELTZER PLUS COLD & FLU PO) Take 2 tablets by mouth every 4 (four) hours as needed (cold).   Yes Historical Provider, MD  HYDROcodone-acetaminophen (NORCO/VICODIN) 5-325 MG per tablet Take 1 tablet by mouth every 6 (six) hours as needed for moderate pain. Patient not taking: Reported on 04/27/2015 09/01/14   Earley Favor, NP  ibuprofen (ADVIL,MOTRIN) 600 MG tablet Take 1 tablet (600 mg total) by mouth every 6 (six) hours as needed. Patient not taking: Reported on 09/01/2014 01/23/14   Brock Bad, MD  ibuprofen (ADVIL,MOTRIN) 600 MG tablet Take 1 tablet (600 mg total) by  mouth every 8 (eight) hours as needed. Patient not taking: Reported on 11/21/2014 09/01/14   Earley Favor, NP  norethindrone (MICRONOR,CAMILA,ERRIN) 0.35 MG tablet Take 1 tablet (0.35 mg total) by mouth daily. Patient not taking: Reported on 04/27/2015 03/03/14   Brock Bad, MD  Oxycodone HCl 10 MG TABS Take 1 tablet (10 mg total) by mouth every 6 (six) hours as needed. Patient not taking: Reported on 09/01/2014 02/10/14   Brock Bad, MD  Prenat w/o A-FeCbn-DSS-FA-DHA [facility] HARMONY) 27-1-250 MG CAPS Take 1 capsule by mouth daily before breakfast. Patient not taking: Reported on 09/01/2014 08/11/13   Brock Bad, MD   BP 145/94 mmHg  Pulse 99  Temp(Src) 98.7 F (37.1 C) (Oral)  Resp 16  Ht  (1.753 m)  Wt 320 lb (145.151 kg)  BMI 47.23 kg/m2  SpO2 100%  LMP 04/13/2015 (Approximate)  Breastfeeding? No Physical Exam  Constitutional: She is oriented to person, place, and time. She appears well-developed and well-nourished. No distress.  HENT:  Head: Normocephalic and atraumatic.  Nose: Nose normal.  Mouth/Throat: Oropharynx is clear and moist. No oropharyngeal exudate.  Eyes: Conjunctivae and EOM are normal. Pupils are equal, round, and reactive to light. No scleral icterus.  Neck: Normal range of motion. Neck supple. No JVD present. No tracheal deviation present. No thyromegaly present.  Cardiovascular: Normal rate, regular rhythm and normal heart sounds.  Exam reveals no gallop and no friction rub.   No murmur heard. Pulmonary/Chest: She is in respiratory distress. She has wheezes. She exhibits no tenderness.  Mild tachypnea and increased work of breathing. Patient does speak in complete sentences.  Abdominal: Soft. Bowel sounds are normal. She exhibits no distension and no mass. There is no tenderness. There is no rebound and no guarding.  Musculoskeletal: Normal range of motion. She exhibits no edema or tenderness.  Lymphadenopathy:    She has no cervical  adenopathy.  Neurological: She is alert and oriented to person, place, and time. No cranial nerve deficit. She exhibits normal muscle tone.  Skin: Skin is warm and dry. No rash noted. No erythema. No pallor.  Nursing note and vitals reviewed.   ED Course  Procedures (including critical care time) Labs Review Labs Reviewed  BLOOD GAS, VENOUS    Imaging Review Dg Chest 2 View  04/27/2015   CLINICAL DATA:  Asthma.  Shortness of breath.  EXAM: CHEST  2 VIEW  COMPARISON:  11/21/2014  FINDINGS: Shallow inspiration. The heart size and mediastinal contours are within normal limits. Both lungs are clear. The visualized skeletal structures are unremarkable.  IMPRESSION: No active cardiopulmonary disease.   Electronically Signed   By: Burman Nieves M.D.   On: 04/27/2015 04:44   I have personally reviewed and evaluated these images and lab results as part of my  medical decision-making.   EKG Interpretation None      MDM   Final diagnoses:  None    Patient presents emergency department for asthma exacerbation. She was ordered 3 DuoNeb treatments, Solu-Medrol, magnesium, azithromycin. Chest x-ray is negative for pneumonia. Patient was given IV fluids as well. We will reassess the patient after treatment complete.  Upon repeat evaluation of the patient, she was found sleeping in the room.  Despite sleeping, she continues to be tachypneic with inc use of accessory muscles and inc wob.  She was wheezing still as well.  Will obtain VBG to evalu for CO retention.  Patient will be admitted to triad hospitalist for further care.  I spoke with Dr. Clyde Lundborg who accepts the patient.    Tomasita Crumble, MD 04/27/15 [phone]

## 2015-04-27 NOTE — ED Notes (Signed)
Pt having severe asthma attack-audible expiratory and inspiratory wheezing. Had congestion starting Thursday with productive cough-now yellow sputum. Has had chills but has not taken her temperature. Says her daughter has been running a fever recently. Attempted using rescue albuterol inhaler tonight without alleviation. Says Xopinex works best for her-when using continuous albuterol neb treatments her HR runs 180s SVT. No other c/c. MD Oni at bedside.
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
## 2015-04-27 NOTE — Discharge Summary (Signed)
Physician Discharge Summary  [name]:[id number] DOB: [birth year] DOA: 04/27/2015  PCP: Default, Provider, MD - info provided to the pt about [facility]   Admit date: 04/27/2015 Discharge date: 04/27/2015  Recommendations for Outpatient Follow-up:  Pt will continue potassium supplementation as prescribed on discharged She was instructed to follow up in out community clinic for follow up on potassium level    Discharge Diagnoses:  Active Problems:   Asthma exacerbation    Discharge Condition: stable; pt insisted on going home today   Diet recommendation: as tolerated   History of present illness:  24 year old female with past medical history of anxiety,asthma who presented to [facility] hospital with worsening shortness of breath and wheezing associated with cough productive of yellow sputum started one to 2 days prior to this admission. Patient reported using albuterol inhaler without significant symptomatic relief. No reports of chest pain or palpitations.   In ED, patient was hemodynamically stable. Heart rate was 109, respiratory rate 34. Blood work was notable for platelets of 139, potassium 3.1 which was supplemented. She was given nebulizer treatments and Solu-Medrol in ED. She continued to have shortness of breath and wheezing for which reason [facility] was consulted for further evaluation and management of asthma exacerbation.  Hospital Course:   Assessment & Plan    Active Problems: Acute asthma exacerbation -Respiratory status much better since admission (from ED) - No wheezing on physical exam - Continue albuterol inhaler on discharge as per prior home regimen  - Chest x-ray did not show acute cardiopulmonary findings  Anxiety - Patient has history of anxiety. Given Ativan on admission - Feels better, wants to go home'  Hypokalemia - Likely from nebulizer treatments received in ED - Supplemented.  DVT prophylaxis:  - SCDs bilaterally in hospital       Signed:  Manson Passey, MD  [facility] 04/27/2015, 3:55 PM  Pager #: [phone]  Time spent in minutes: more than 30 minutes  Procedures:  None   Consultations:  None   Discharge Exam: Filed Vitals:   04/27/15 1353  BP: 141/66  Pulse: 114  Temp: 98.6 F (37 C)  Resp: 20   Filed Vitals:   04/27/15 0701 04/27/15 0935 04/27/15 1149 04/27/15 1353  BP: 153/112   141/66  Pulse: 117   114  Temp: 98.7 F (37.1 C)   98.6 F (37 C)  TempSrc: Oral   Oral  Resp: 20   20  Height:  (1.753 m)     Weight: 142.3 kg (313 lb 11.4 oz)     SpO2: 100% 98% 98% 98%    General: Pt is alert, follows commands appropriately, not in acute distress Cardiovascular: Regular rate and rhythm, S1/S2 +, no murmurs Respiratory: Clear to auscultation bilaterally, no wheezing, no crackles, no rhonchi Abdominal: Soft, non tender, non distended, bowel sounds +, no guarding Extremities: no edema, no cyanosis, pulses palpable bilaterally DP and PT Neuro: Grossly nonfocal  Discharge Instructions  Discharge Instructions    Call MD for:  difficulty breathing, headache or visual disturbances    Complete by:  As directed      Call MD for:  persistant nausea and vomiting    Complete by:  As directed      Call MD for:  severe uncontrolled pain    Complete by:  As directed      Diet - low sodium heart healthy    Complete by:  As directed      Discharge instructions  Complete by:  As directed   Take potassium for 5 days on discharge. Please recheck potassium level in 1 week after discharge.     Increase activity slowly    Complete by:  As directed             Medication List    STOP taking these medications        ALKA-SELTZER PLUS COLD & FLU PO     CITRANATAL HARMONY 27-1-250 MG Caps     HYDROcodone-acetaminophen 5-325 MG per tablet  Commonly known as:  NORCO/VICODIN     norethindrone 0.35 MG tablet  Commonly known as:  MICRONOR,CAMILA,ERRIN     Oxycodone HCl 10  MG Tabs     oxymetazoline 0.05 % nasal spray  Commonly known as:  AFRIN      TAKE these medications        albuterol 108 (90 BASE) MCG/ACT inhaler  Commonly known as:  PROVENTIL HFA;VENTOLIN HFA  Inhale 2 puffs into the lungs every 6 (six) hours as needed for wheezing or shortness of breath.     calcium carbonate 750 MG chewable tablet  Commonly known as:  TUMS EX  Chew 3 tablets by mouth daily as needed for heartburn.     etonogestrel-ethinyl estradiol 0.12-0.015 MG/24HR vaginal ring  Commonly known as:  NUVARING  Place 1 each vaginally every 28 (twenty-eight) days. Insert vaginally and leave in place for 3 consecutive weeks, then remove for 1 week.     ibuprofen 200 MG tablet  Commonly known as:  ADVIL,MOTRIN  Take 600 mg by mouth every 6 (six) hours as needed for moderate pain.     LORazepam 2 MG tablet  Commonly known as:  ATIVAN  Take 2 mg by mouth every 6 (six) hours as needed for anxiety.     OVER THE COUNTER MEDICATION  Take 30 mLs by mouth once.     potassium chloride 10 MEQ tablet  Commonly known as:  K-DUR  Take 1 tablet (10 mEq total) by mouth daily.           Follow-up Information    Follow up with [facility]    . Schedule an appointment as soon as possible for a visit in 1 week.   Why:  Follow up appt after recent hospitalization   Contact information:   [address] [facility] [address] [phone]       The results of significant diagnostics from this hospitalization (including imaging, microbiology, ancillary and laboratory) are listed below for reference.    Significant Diagnostic Studies: Dg Chest 2 View  04/27/2015   CLINICAL DATA:  Asthma.  Shortness of breath.  EXAM: CHEST  2 VIEW  COMPARISON:  11/21/2014  FINDINGS: Shallow inspiration. The heart size and mediastinal contours are within normal limits. Both lungs are clear. The visualized skeletal structures are unremarkable.  IMPRESSION:  No active cardiopulmonary disease.   Electronically Signed   By: Burman Nieves M.D.   On: 04/27/2015 04:44    Microbiology: No results found for this or any previous visit (from the past 240 hour(s)).   Labs: Basic Metabolic Panel:  Recent Labs Lab 04/27/15 0622 04/27/15 0855  NA 135 137  K 3.1* 3.0*  CL 104 104  CO2 24 19*  GLUCOSE 165* 167*  BUN 12 12  CREATININE 0.84 0.96  CALCIUM 8.6* 9.1  MG  --  2.0  PHOS  --  2.3*   Liver Function Tests:  Recent Labs Lab 04/27/15 0855  AST 28  ALT 20  ALKPHOS 62  BILITOT 0.5  PROT 7.4  ALBUMIN 3.8   No results for input(s): LIPASE, AMYLASE in the last 168 hours. No results for input(s): AMMONIA in the last 168 hours. CBC:  Recent Labs Lab 04/27/15 0622 04/27/15 0855  WBC 7.2 8.8  NEUTROABS 5.2 7.9*  HGB 13.8 14.2  HCT 40.5 41.6  MCV 89.0 88.9  PLT 139* 156   Cardiac Enzymes: No results for input(s): CKTOTAL, CKMB, CKMBINDEX, TROPONINI in the last 168 hours. BNP: BNP (last 3 results) No results for input(s): BNP in the last 8760 hours.  ProBNP (last 3 results) No results for input(s): PROBNP in the last 8760 hours.  CBG: No results for input(s): GLUCAP in the last 168 hours.

## 2015-04-27 NOTE — ED Notes (Signed)
Patient transported to X-ray 

## 2015-04-27 NOTE — Discharge Instructions (Signed)
Asthma Attack Prevention °Although there is no way to prevent asthma from starting, you can take steps to control the disease and reduce its symptoms. Learn about your asthma and how to control it. Take an active role to control your asthma by working with your health care provider to create and follow an asthma action plan. An asthma action plan guides you in: °· Taking your medicines properly. °· Avoiding things that set off your asthma or make your asthma worse (asthma triggers). °· Tracking your level of asthma control. °· Responding to worsening asthma. °· Seeking emergency care when needed. °To track your asthma, keep records of your symptoms, check your peak flow number using a handheld device that shows how well air moves out of your lungs (peak flow meter), and get regular asthma checkups.  °WHAT ARE SOME WAYS TO PREVENT AN ASTHMA ATTACK? °· Take medicines as directed by your health care provider. °· Keep track of your asthma symptoms and level of control. °· With your health care provider, write a detailed plan for taking medicines and managing an asthma attack. Then be sure to follow your action plan. Asthma is an ongoing condition that needs regular monitoring and treatment. °· Identify and avoid asthma triggers. Many outdoor allergens and irritants (such as pollen, mold, cold air, and air pollution) can trigger asthma attacks. Find out what your asthma triggers are and take steps to avoid them. °· Monitor your breathing. Learn to recognize warning signs of an attack, such as coughing, wheezing, or shortness of breath. Your lung function may decrease before you notice any signs or symptoms, so regularly measure and record your peak airflow with a home peak flow meter. °· Identify and treat attacks early. If you act quickly, you are less likely to have a severe attack. You will also need less medicine to control your symptoms. When your peak flow measurements decrease and alert you to an upcoming attack,  take your medicine as instructed and immediately stop any activity that may have triggered the attack. If your symptoms do not improve, get medical help. °· Pay attention to increasing quick-relief inhaler use. If you find yourself relying on your quick-relief inhaler, your asthma is not under control. See your health care provider about adjusting your treatment. °WHAT CAN MAKE MY SYMPTOMS WORSE? °A number of common things can set off or make your asthma symptoms worse and cause temporary increased inflammation of your airways. Keep track of your asthma symptoms for several weeks, detailing all the environmental and emotional factors that are linked with your asthma. When you have an asthma attack, go back to your asthma diary to see which factor, or combination of factors, might have contributed to it. Once you know what these factors are, you can take steps to control many of them. If you have allergies and asthma, it is important to take asthma prevention steps at home. Minimizing contact with the substance to which you are allergic will help prevent an asthma attack. Some triggers and ways to avoid these triggers are: °Animal Dander:  °Some people are allergic to the flakes of skin or dried saliva from animals with fur or feathers.  °· There is no such thing as a hypoallergenic dog or cat breed. All dogs or cats can cause allergies, even if they don't shed. °· Keep these pets out of your home. °· If you are not able to keep a pet outdoors, keep the pet out of your bedroom and other sleeping areas at all   times, and keep the door closed. °· Remove carpets and furniture covered with cloth from your home. If that is not possible, keep the pet away from fabric-covered furniture and carpets. °Dust Mites: °Many people with asthma are allergic to dust mites. Dust mites are tiny bugs that are found in every home in mattresses, pillows, carpets, fabric-covered furniture, bedcovers, clothes, stuffed toys, and other  fabric-covered items.  °· Cover your mattress in a special dust-proof cover. °· Cover your pillow in a special dust-proof cover, or wash the pillow each week in hot water. Water must be hotter than 130° F (54.4° C) to kill dust mites. Cold or warm water used with detergent and bleach can also be effective. °· Wash the sheets and blankets on your bed each week in hot water. °· Try not to sleep or lie on cloth-covered cushions. °· Call ahead when traveling and ask for a smoke-free hotel room. Bring your own bedding and pillows in case the hotel only supplies feather pillows and down comforters, which may contain dust mites and cause asthma symptoms. °· Remove carpets from your bedroom and those laid on concrete, if you can. °· Keep stuffed toys out of the bed, or wash the toys weekly in hot water or cooler water with detergent and bleach. °Cockroaches: °Many people with asthma are allergic to the droppings and remains of cockroaches.  °· Keep food and garbage in closed containers. Never leave food out. °· Use poison baits, traps, powders, gels, or paste (for example, boric acid). °· If a spray is used to kill cockroaches, stay out of the room until the odor goes away. °Indoor Mold: °· Fix leaky faucets, pipes, or other sources of water that have mold around them. °· Clean floors and moldy surfaces with a fungicide or diluted bleach. °· Avoid using humidifiers, vaporizers, or swamp coolers. These can spread molds through the air. °Pollen and Outdoor Mold: °· When pollen or mold spore counts are high, try to keep your windows closed. °· Stay indoors with windows closed from late morning to afternoon. Pollen and some mold spore counts are highest at that time. °· Ask your health care provider whether you need to take anti-inflammatory medicine or increase your dose of the medicine before your allergy season starts. °Other Irritants to Avoid: °· Tobacco smoke is an irritant. If you smoke, ask your health care provider how  you can quit. Ask family members to quit smoking, too. Do not allow smoking in your home or car. °· If possible, do not use a wood-burning stove, kerosene heater, or fireplace. Minimize exposure to all sources of smoke, including incense, candles, fires, and fireworks. °· Try to stay away from strong odors and sprays, such as perfume, talcum powder, hair spray, and paints. °· Decrease humidity in your home and use an indoor air cleaning device. Reduce indoor humidity to below 60%. Dehumidifiers or central air conditioners can do this. °· Decrease house dust exposure by changing furnace and air cooler filters frequently. °· Try to have someone else vacuum for you once or twice a week. Stay out of rooms while they are being vacuumed and for a short while afterward. °· If you vacuum, use a dust mask from a hardware store, a double-layered or microfilter vacuum cleaner bag, or a vacuum cleaner with a HEPA filter. °· Sulfites in foods and beverages can be irritants. Do not drink beer or wine or eat dried fruit, processed potatoes, or shrimp if they cause asthma symptoms. °· Cold   air can trigger an asthma attack. Cover your nose and mouth with a scarf on cold or windy days. °· Several health conditions can make asthma more difficult to manage, including a runny nose, sinus infections, reflux disease, psychological stress, and sleep apnea. Work with your health care provider to manage these conditions. °· Avoid close contact with people who have a respiratory infection such as a cold or the flu, since your asthma symptoms may get worse if you catch the infection. Wash your hands thoroughly after touching items that may have been handled by people with a respiratory infection. °· Get a flu shot every year to protect against the flu virus, which often makes asthma worse for days or weeks. Also get a pneumonia shot if you have not previously had one. Unlike the flu shot, the pneumonia shot does not need to be given  yearly. °Medicines: °· Talk to your health care provider about whether it is safe for you to take aspirin or non-steroidal anti-inflammatory medicines (NSAIDs). In a small number of people with asthma, aspirin and NSAIDs can cause asthma attacks. These medicines must be avoided by people who have known aspirin-sensitive asthma. It is important that people with aspirin-sensitive asthma read labels of all over-the-counter medicines used to treat pain, colds, coughs, and fever. °· Beta-blockers and ACE inhibitors are other medicines you should discuss with your health care provider. °HOW CAN I FIND OUT WHAT I AM ALLERGIC TO? °Ask your asthma health care provider about allergy skin testing or blood testing (the RAST test) to identify the allergens to which you are sensitive. If you are found to have allergies, the most important thing to do is to try to avoid exposure to any allergens that you are sensitive to as much as possible. Other treatments for allergies, such as medicines and allergy shots (immunotherapy) are available.  °CAN I EXERCISE? °Follow your health care provider's advice regarding asthma treatment before exercising. It is important to maintain a regular exercise program, but vigorous exercise or exercise in cold, humid, or dry environments can cause asthma attacks, especially for those people who have exercise-induced asthma. °Document Released: 07/26/2009 Document Revised: 08/12/2013 Document Reviewed: 02/12/2013 °[employer]® Patient Information ©2015 [employer]. This information is not intended to replace advice given to you by your health care provider. Make sure you discuss any questions you have with your health care provider. ° °Asthma °Asthma is a recurring condition in which the airways tighten and narrow. Asthma can make it difficult to breathe. It can cause coughing, wheezing, and shortness of breath. Asthma episodes, also called asthma attacks, range from minor to life-threatening. Asthma  cannot be cured, but medicines and lifestyle changes can help control it. °CAUSES °Asthma is believed to be caused by inherited (genetic) and environmental factors, but its exact cause is unknown. Asthma may be triggered by allergens, lung infections, or irritants in the air. Asthma triggers are different for each person. Common triggers include:  °· Animal dander. °· Dust mites. °· Cockroaches. °· Pollen from trees or grass. °· Mold. °· Smoke. °· Air pollutants such as dust, household cleaners, hair sprays, aerosol sprays, paint fumes, strong chemicals, or strong odors. °· Cold air, weather changes, and winds (which increase molds and pollens in the air). °· Strong emotional expressions such as crying or laughing hard. °· Stress. °· Certain medicines (such as aspirin) or types of drugs (such as beta-blockers). °· Sulfites in foods and drinks. Foods and drinks that may contain sulfites include dried fruit, potato   chips, and sparkling grape juice. °· Infections or inflammatory conditions such as the flu, a cold, or an inflammation of the nasal membranes (rhinitis). °· Gastroesophageal reflux disease (GERD). °· Exercise or strenuous activity. °SYMPTOMS °Symptoms may occur immediately after asthma is triggered or many hours later. Symptoms include: °· Wheezing. °· Excessive nighttime or early morning coughing. °· Frequent or severe coughing with a common cold. °· Chest tightness. °· Shortness of breath. °DIAGNOSIS  °The diagnosis of asthma is made by a review of your medical history and a physical exam. Tests may also be performed. These may include: °· Lung function studies. These tests show how much air you breathe in and out. °· Allergy tests. °· Imaging tests such as X-rays. °TREATMENT  °Asthma cannot be cured, but it can usually be controlled. Treatment involves identifying and avoiding your asthma triggers. It also involves medicines. There are 2 classes of medicine used for asthma treatment:  °· Controller  medicines. These prevent asthma symptoms from occurring. They are usually taken every day. °· Reliever or rescue medicines. These quickly relieve asthma symptoms. They are used as needed and provide short-term relief. °Your health care provider will help you create an asthma action plan. An asthma action plan is a written plan for managing and treating your asthma attacks. It includes a list of your asthma triggers and how they may be avoided. It also includes information on when medicines should be taken and when their dosage should be changed. An action plan may also involve the use of a device called a peak flow meter. A peak flow meter measures how well the lungs are working. It helps you monitor your condition. °HOME CARE INSTRUCTIONS  °· Take medicines only as directed by your health care provider. Speak with your health care provider if you have questions about how or when to take the medicines. °· Use a peak flow meter as directed by your health care provider. Record and keep track of readings. °· Understand and use the action plan to help minimize or stop an asthma attack without needing to seek medical care. °· Control your home environment in the following ways to help prevent asthma attacks: °¨ Do not smoke. Avoid being exposed to secondhand smoke. °¨ Change your heating and air conditioning filter regularly. °¨ Limit your use of fireplaces and wood stoves. °¨ Get rid of pests (such as roaches and mice) and their droppings. °¨ Throw away plants if you see mold on them. °¨ Clean your floors and dust regularly. Use unscented cleaning products. °¨ Try to have someone else vacuum for you regularly. Stay out of rooms while they are being vacuumed and for a short while afterward. If you vacuum, use a dust mask from a hardware store, a double-layered or microfilter vacuum cleaner bag, or a vacuum cleaner with a HEPA filter. °¨ Replace carpet with wood, tile, or vinyl flooring. Carpet can trap dander and  dust. °¨ Use allergy-proof pillows, mattress covers, and box spring covers. °¨ Wash bed sheets and blankets every week in hot water and dry them in a dryer. °¨ Use blankets that are made of polyester or cotton. °¨ Clean bathrooms and kitchens with bleach. If possible, have someone repaint the walls in these rooms with mold-resistant paint. Keep out of the rooms that are being cleaned and painted. °¨ Wash hands frequently. °SEEK MEDICAL CARE IF:  °· You have wheezing, shortness of breath, or a cough even if taking medicine to prevent attacks. °· The colored mucus   you cough up (sputum) is thicker than usual. °· Your sputum changes from clear or white to yellow, green, gray, or bloody. °· You have any problems that may be related to the medicines you are taking (such as a rash, itching, swelling, or trouble breathing). °· You are using a reliever medicine more than 2-3 times per week. °· Your peak flow is still at 50-79% of your personal best after following your action plan for 1 hour. °· You have a fever. °SEEK IMMEDIATE MEDICAL CARE IF:  °· You seem to be getting worse and are unresponsive to treatment during an asthma attack. °· You are short of breath even at rest. °· You get short of breath when doing very little physical activity. °· You have difficulty eating, drinking, or talking due to asthma symptoms. °· You develop chest pain. °· You develop a fast heartbeat. °· You have a bluish color to your lips or fingernails. °· You are light-headed, dizzy, or faint. °· Your peak flow is less than 50% of your personal best. °MAKE SURE YOU:  °· Understand these instructions. °· Will watch your condition. °· Will get help right away if you are not doing well or get worse. °Document Released: 08/07/2005 Document Revised: 12/22/2013 Document Reviewed: 03/06/2013 °ExitCare® Patient Information ©2015 [employer]. This information is not intended to replace advice given to you by your health care provider. Make sure you  discuss any questions you have with your health care provider. ° °

## 2015-04-27 NOTE — Progress Notes (Signed)
Pt requesting to speak with Dr. Elisabeth Pigeon. Dr Elisabeth Pigeon text paged to please call pt in room. Will continue to monitor.

## 2015-04-27 NOTE — Care Management Note (Signed)
Case Management Note  Patient Details  Name: [name] MRN: [id number] Date of Birth: Dec 02, 1990  Subjective/Objective: 24 y/o f admitted w/Asthma exac. From home.                   Action/Plan:d/c plan home.no needs or orders.   Expected Discharge Date:                  Expected Discharge Plan:  Home/Self Care  In-House Referral:     Discharge planning Services  CM Consult  Post Acute Care Choice:    Choice offered to:     DME Arranged:    DME Agency:     HH Arranged:    HH Agency:     Status of Service:  Completed, signed off  Medicare Important Message Given:    Date Medicare IM Given:    Medicare IM give by:    Date Additional Medicare IM Given:    Additional Medicare Important Message give by:     If discussed at Long Length of Stay Meetings, dates discussed:    Additional Comments:  Lanier Clam, RN 04/27/2015, 4:27 PM

## 2015-08-16 ENCOUNTER — Encounter (HOSPITAL_COMMUNITY): Payer: Self-pay | Admitting: Oncology

## 2015-08-16 ENCOUNTER — Emergency Department (HOSPITAL_COMMUNITY)
Admission: EM | Admit: 2015-08-16 | Discharge: 2015-08-16 | Disposition: A | Discharge status: Home / Self Care | Payer: Medicaid Other | Attending: Emergency Medicine | Admitting: Emergency Medicine

## 2015-08-16 DIAGNOSIS — R531 Weakness: Secondary | ICD-10-CM | POA: Insufficient documentation

## 2015-08-16 DIAGNOSIS — Z3202 Encounter for pregnancy test, result negative: Secondary | ICD-10-CM | POA: Insufficient documentation

## 2015-08-16 DIAGNOSIS — Z87828 Personal history of other (healed) physical injury and trauma: Secondary | ICD-10-CM | POA: Insufficient documentation

## 2015-08-16 DIAGNOSIS — Z8639 Personal history of other endocrine, nutritional and metabolic disease: Secondary | ICD-10-CM | POA: Insufficient documentation

## 2015-08-16 DIAGNOSIS — R1084 Generalized abdominal pain: Secondary | ICD-10-CM | POA: Insufficient documentation

## 2015-08-16 DIAGNOSIS — Z79899 Other long term (current) drug therapy: Secondary | ICD-10-CM | POA: Insufficient documentation

## 2015-08-16 DIAGNOSIS — R112 Nausea with vomiting, unspecified: Secondary | ICD-10-CM | POA: Insufficient documentation

## 2015-08-16 DIAGNOSIS — F17210 Nicotine dependence, cigarettes, uncomplicated: Secondary | ICD-10-CM | POA: Insufficient documentation

## 2015-08-16 DIAGNOSIS — R197 Diarrhea, unspecified: Secondary | ICD-10-CM | POA: Insufficient documentation

## 2015-08-16 DIAGNOSIS — Z8742 Personal history of other diseases of the female genital tract: Secondary | ICD-10-CM | POA: Insufficient documentation

## 2015-08-16 DIAGNOSIS — J45909 Unspecified asthma, uncomplicated: Secondary | ICD-10-CM | POA: Insufficient documentation

## 2015-08-16 DIAGNOSIS — R42 Dizziness and giddiness: Secondary | ICD-10-CM | POA: Insufficient documentation

## 2015-08-16 LAB — COMPREHENSIVE METABOLIC PANEL
ALT: 16 U/L (ref 14–54)
AST: 15 U/L (ref 15–41)
Albumin: 3.6 g/dL (ref 3.5–5.0)
Alkaline Phosphatase: 71 U/L (ref 38–126)
Anion gap: 11 (ref 5–15)
BILIRUBIN TOTAL: 0.7 mg/dL (ref 0.3–1.2)
BUN: 17 mg/dL (ref 6–20)
CO2: 19 mmol/L — ABNORMAL LOW (ref 22–32)
CREATININE: 0.81 mg/dL (ref 0.44–1.00)
Calcium: 8.6 mg/dL — ABNORMAL LOW (ref 8.9–10.3)
Chloride: 108 mmol/L (ref 101–111)
GFR calc Af Amer: >60 mL/min (ref >60)
Glucose, Bld: 115 mg/dL — ABNORMAL HIGH (ref 65–99)
Potassium: 3.5 mmol/L (ref 3.5–5.1)
Sodium: 138 mmol/L (ref 135–145)
TOTAL PROTEIN: 7 g/dL (ref 6.5–8.1)

## 2015-08-16 LAB — CBC WITH DIFFERENTIAL/PLATELET
BASOS ABS: 0 10*3/uL (ref 0.0–0.1)
Basophils Relative: 0 %
Eosinophils Absolute: 0.2 10*3/uL (ref 0.0–0.7)
Eosinophils Relative: 2 %
HEMATOCRIT: 41.6 % (ref 36.0–46.0)
Hemoglobin: 14.2 g/dL (ref 12.0–15.0)
LYMPHS PCT: 15 %
Lymphs Abs: 1.2 10*3/uL (ref 0.7–4.0)
MCH: 29.7 pg (ref 26.0–34.0)
MCHC: 34.1 g/dL (ref 30.0–36.0)
MCV: 87 fL (ref 78.0–100.0)
MONO ABS: 0.7 10*3/uL (ref 0.1–1.0)
Monocytes Relative: 10 %
NEUTROS ABS: 5.6 10*3/uL (ref 1.7–7.7)
Neutrophils Relative %: 73 %
Platelets: 174 10*3/uL (ref 150–400)
RBC: 4.78 MIL/uL (ref 3.87–5.11)
RDW: 12.1 % (ref 11.5–15.5)
WBC: 7.6 10*3/uL (ref 4.0–10.5)

## 2015-08-16 LAB — URINALYSIS, ROUTINE W REFLEX MICROSCOPIC
GLUCOSE, UA: NEGATIVE mg/dL
KETONES UR: NEGATIVE mg/dL
Leukocytes, UA: NEGATIVE
Nitrite: NEGATIVE
PROTEIN: NEGATIVE mg/dL
Specific Gravity, Urine: 1.029 (ref 1.005–1.030)
pH: 5 (ref 5.0–8.0)

## 2015-08-16 LAB — POC URINE PREG, ED: Preg Test, Ur: NEGATIVE

## 2015-08-16 LAB — URINE MICROSCOPIC-ADD ON: RBC / HPF: NONE SEEN RBC/hpf (ref 0–5)

## 2015-08-16 LAB — LIPASE, BLOOD: LIPASE: 25 U/L (ref 11–51)

## 2015-08-16 MED ORDER — SODIUM CHLORIDE 0.9 % IV BOLUS (SEPSIS)
1000.0000 mL | Freq: Once | INTRAVENOUS | Status: AC
  Administered 2015-08-16: 1000 mL via INTRAVENOUS

## 2015-08-16 MED ORDER — DICYCLOMINE HCL 10 MG/ML IM SOLN
20.0000 mg | Freq: Once | INTRAMUSCULAR | Status: DC
  Filled 2015-08-16: qty 2

## 2015-08-16 MED ORDER — DICYCLOMINE HCL 10 MG PO CAPS
20.0000 mg | ORAL_CAPSULE | Freq: Once | ORAL | Status: AC
  Administered 2015-08-16: 20 mg via ORAL
  Filled 2015-08-16: qty 2

## 2015-08-16 MED ORDER — KETOROLAC TROMETHAMINE 30 MG/ML IJ SOLN
30.0000 mg | Freq: Once | INTRAMUSCULAR | Status: AC
  Administered 2015-08-16: 30 mg via INTRAVENOUS
  Filled 2015-08-16: qty 1

## 2015-08-16 MED ORDER — LOPERAMIDE HCL 2 MG PO CAPS
4.0000 mg | ORAL_CAPSULE | Freq: Once | ORAL | Status: AC
  Administered 2015-08-16: 4 mg via ORAL
  Filled 2015-08-16: qty 2

## 2015-08-16 MED ORDER — DIPHENHYDRAMINE HCL 50 MG/ML IJ SOLN
25.0000 mg | Freq: Once | INTRAMUSCULAR | Status: AC
  Administered 2015-08-16: 25 mg via INTRAVENOUS
  Filled 2015-08-16: qty 1

## 2015-08-16 MED ORDER — PROMETHAZINE HCL 25 MG PO TABS
25.0000 mg | ORAL_TABLET | Freq: Four times a day (QID) | ORAL | Status: DC | PRN
Start: 2015-08-16 — End: 2016-09-24

## 2015-08-16 MED ORDER — ONDANSETRON HCL 4 MG/2ML IJ SOLN
4.0000 mg | Freq: Once | INTRAMUSCULAR | Status: AC
  Administered 2015-08-16: 4 mg via INTRAVENOUS
  Filled 2015-08-16: qty 2

## 2015-08-16 MED ORDER — ONDANSETRON 4 MG PO TBDP
4.0000 mg | ORAL_TABLET | Freq: Three times a day (TID) | ORAL | Status: DC | PRN
Start: 2015-08-16 — End: 2016-09-24

## 2015-08-16 MED ORDER — DICYCLOMINE HCL 20 MG PO TABS: 20.0000 mg | ORAL_TABLET | Freq: Three times a day (TID) | ORAL | Status: DC

## 2015-08-16 NOTE — ED Provider Notes (Signed)
By signing my name below, I, Ronney Lion, attest that this documentation has been prepared under the direction and in the presence of Verdell Dykman N Jury Caserta, DO. Electronically Signed: Ronney Lion, ED Scribe. 08/16/2015. 4:18 AM.  TIME SEEN: 4:06 AM   CHIEF COMPLAINT: Emesis and diarrhea  HPI:  [name] is a 24 y.o. female with a history of endometriosis and asthma, who presents to the Emergency Department complaining of constant, "countless" episodes of vomiting and diarrhea for the past 1-2 days. She also complains of associated diffuse, cramping, abdominal pain, lightheadedness, and generalized weakness. She states she came in today because her legs buckled underneath her while she was in the bathroom because she was feeling weak from her symptoms, so a loved one urged her to come to the ED. Patient has had sick contact with a coworker who had similar symptoms a few days ago. Patient states she has taken Pepto-Bismol and phenergan with no relief. She denies any fever, dysuria, hematuria, vaginal bleeding, or vaginal discharge. Patient reports she uses the Nuva-ring for contraception. She states that she thinks she has neuro virus. No recent travel. She has had a previous C-section but no other abdominal surgeries.  ROS: See HPI Constitutional: no fever  Eyes: no drainage  ENT: no runny nose   Cardiovascular:  no chest pain  Resp: no SOB  GI: vomiting GU: no dysuria Integumentary: no rash  Allergy: no hives  Musculoskeletal: no leg swelling  Neurological: no slurred speech ROS otherwise negative  PAST MEDICAL HISTORY/PAST SURGICAL HISTORY:  Past Medical History  Diagnosis Date  . Endometriosis   . Asthma   . PCOS (polycystic ovarian syndrome)   . Animal bite of finger     a cat  . Bronchitis     MEDICATIONS:  Prior to Admission medications   Medication Sig Start Date End Date Taking? Authorizing Provider  albuterol (PROVENTIL HFA;VENTOLIN HFA) 108 (90 BASE) MCG/ACT inhaler  Inhale 2 puffs into the lungs every 6 (six) hours as needed for wheezing or shortness of breath. 04/27/15   Alison Murray, MD  calcium carbonate (TUMS EX) 750 MG chewable tablet Chew 3 tablets by mouth daily as needed for heartburn.    Historical Provider, MD  etonogestrel-ethinyl estradiol (NUVARING) 0.12-0.015 MG/24HR vaginal ring Place 1 each vaginally every 28 (twenty-eight) days. Insert vaginally and leave in place for 3 consecutive weeks, then remove for 1 week.    Historical Provider, MD  ibuprofen (ADVIL,MOTRIN) 200 MG tablet Take 600 mg by mouth every 6 (six) hours as needed for moderate pain.     Historical Provider, MD  LORazepam (ATIVAN) 2 MG tablet Take 2 mg by mouth every 6 (six) hours as needed for anxiety.    Historical Provider, MD  OVER THE COUNTER MEDICATION Take 30 mLs by mouth once.    Historical Provider, MD  potassium chloride (K-DUR) 10 MEQ tablet Take 1 tablet (10 mEq total) by mouth daily. 04/27/15   Alison Murray, MD    ALLERGIES:  No Known Allergies  SOCIAL HISTORY:  Social History  Substance Use Topics  . Smoking status: Current Every Day Smoker -- 0.25 packs/day    Types: Cigarettes  . Smokeless tobacco: Never Used  . Alcohol Use: Yes    FAMILY HISTORY: Family History  Problem Relation Age of Onset  . Asthma Mother   . Diabetes Father   . Heart attack Maternal Grandfather   . Diabetes Paternal Grandmother   . Heart attack Paternal Grandfather  EXAM: BP 140/106 mmHg  Pulse 118  Temp(Src) 98.6 F (37 C) (Oral)  Resp 18  Ht 5\' 9"  (1.753 m)  Wt 310 lb (140.615 kg)  BMI 45.76 kg/m2  SpO2 99% CONSTITUTIONAL: Alert and oriented and responds appropriately to questions. Well-appearing; well-nourished, obese HEAD: Normocephalic EYES: Conjunctivae clear, PERRL ENT: normal nose; no rhinorrhea; slightly dry mucous membranes; pharynx without lesions noted NECK: Supple, no meningismus, no LAD  CARD: Regular and tachycardic; S1 and S2 appreciated; no  murmurs, no clicks, no rubs, no gallops RESP: Normal chest excursion without splinting or tachypnea; breath sounds clear and equal bilaterally; no wheezes, no rhonchi, no rales, no hypoxia or respiratory distress, speaking full sentences ABD/GI: Normal bowel sounds; non-distended; soft, minimally tender around the umbilicus, no rebound, no guarding, no peritoneal signs BACK:  The back appears normal and is non-tender to palpation, there is no CVA tenderness EXT: Normal ROM in all joints; non-tender to palpation; no edema; normal capillary refill; no cyanosis, no calf tenderness or swelling    SKIN: Normal color for age and race; warm NEURO: Moves all extremities equally, sensation to light touch intact diffusely, cranial nerves II through XII intact PSYCH: The patient's mood and manner are appropriate. Grooming and personal hygiene are appropriate.  MEDICAL DECISION MAKING: Patient here with viral gastroenteritis. Abdominal exam is benign. She does appear dehydrated. We'll give IV fluids, obtain labs, urine. Will give Bentyl, Toradol, Zofran, Imodium and reassess.  ED PROGRESS: Patient's labs are unremarkable. She is not pregnant. Urine shows many bacteria but also many squamous cells. Suspect dirty catch. She is not having any GU symptoms. She's been able to drink and is feeling better. Heart rate has improved with IV hydration. We'll discharge with prescriptions for Zofran and Bentyl. Have discussed return precautions. She verbalized understanding and is comfortable with this plan.    Patient reports that she does not have a primary care provider and is requesting that I refill her chronic Ativan. She does not take this medication every day but only as needed. Discussed with patient that she will need to follow-up with her primary care doctor for refill for this medication and I have provided her with a list of doctors. She verbalized understanding.      I personally performed the services  described in this documentation, which was scribed in my presence. The recorded information has been reviewed and is accurate.     Layla MawKristen N Jozelyn Kuwahara, DO 08/16/15 [phone]

## 2015-08-16 NOTE — Discharge Instructions (Signed)
Viral Gastroenteritis °Viral gastroenteritis is also known as stomach flu. This condition affects the stomach and intestinal tract. It can cause sudden diarrhea and vomiting. The illness typically lasts 3 to 8 days. Most people develop an immune response that eventually gets rid of the virus. While this natural response develops, the virus can make you quite ill. °CAUSES  °Many different viruses can cause gastroenteritis, such as rotavirus or noroviruses. You can catch one of these viruses by consuming contaminated food or water. You may also catch a virus by sharing utensils or other personal items with an infected person or by touching a contaminated surface. °SYMPTOMS  °The most common symptoms are diarrhea and vomiting. These problems can cause a severe loss of body fluids (dehydration) and a body salt (electrolyte) imbalance. Other symptoms may include: °· Fever. °· Headache. °· Fatigue. °· Abdominal pain. °DIAGNOSIS  °Your caregiver can usually diagnose viral gastroenteritis based on your symptoms and a physical exam. A stool sample may also be taken to test for the presence of viruses or other infections. °TREATMENT  °This illness typically goes away on its own. Treatments are aimed at rehydration. The most serious cases of viral gastroenteritis involve vomiting so severely that you are not able to keep fluids down. In these cases, fluids must be given through an intravenous line (IV). °HOME CARE INSTRUCTIONS  °· Drink enough fluids to keep your urine clear or pale yellow. Drink small amounts of fluids frequently and increase the amounts as tolerated. °· Ask your caregiver for specific rehydration instructions. °· Avoid: °¨ Foods high in sugar. °¨ Alcohol. °¨ Carbonated drinks. °¨ Tobacco. °¨ Juice. °¨ Caffeine drinks. °¨ Extremely hot or cold fluids. °¨ Fatty, greasy foods. °¨ Too much intake of anything at one time. °¨ Dairy products until 24 to 48 hours after diarrhea stops. °· You may consume probiotics.  Probiotics are active cultures of beneficial bacteria. They may lessen the amount and number of diarrheal stools in adults. Probiotics can be found in yogurt with active cultures and in supplements. °· Wash your hands well to avoid spreading the virus. °· Only take over-the-counter or prescription medicines for pain, discomfort, or fever as directed by your caregiver. Do not give aspirin to children. Antidiarrheal medicines are not recommended. °· Ask your caregiver if you should continue to take your regular prescribed and over-the-counter medicines. °· Keep all follow-up appointments as directed by your caregiver. °SEEK IMMEDIATE MEDICAL CARE IF:  °· You are unable to keep fluids down. °· You do not urinate at least once every 6 to 8 hours. °· You develop shortness of breath. °· You notice blood in your stool or vomit. This may look like coffee grounds. °· You have abdominal pain that increases or is concentrated in one small area (localized). °· You have persistent vomiting or diarrhea. °· You have a fever. °· The patient is a child younger than 3 months, and he or she has a fever. °· The patient is a child older than 3 months, and he or she has a fever and persistent symptoms. °· The patient is a child older than 3 months, and he or she has a fever and symptoms suddenly get worse. °· The patient is a baby, and he or she has no tears when crying. °MAKE SURE YOU:  °· Understand these instructions. °· Will watch your condition. °· Will get help right away if you are not doing well or get worse. °  °This information is not intended to replace   advice given to you by your health care provider. Make sure you discuss any questions you have with your health care provider.   Document Released: 08/07/2005 Document Revised: 10/30/2011 Document Reviewed: 05/24/2011 Elsevier Interactive Patient Education 2016 [employer].     Emergency Department Resource Guide 1) Find a Doctor and Pay Out of Pocket Although you  won't have to find out who is covered by your insurance plan, it is a good idea to ask around and get recommendations. You will then need to call the office and see if the doctor you have chosen will accept you as a new patient and what types of options they offer for patients who are self-pay. Some doctors offer discounts or will set up payment plans for their patients who do not have insurance, but you will need to ask so you aren't surprised when you get to your appointment.  2) Contact Your Local Health Department Not all health departments have doctors that can see patients for sick visits, but many do, so it is worth a call to see if yours does. If you don't know where your local health department is, you can check in your phone book. The CDC also has a tool to help you locate your state's health department, and many state websites also have listings of all of their local health departments.  3) Find a Walk-in Clinic If your illness is not likely to be very severe or complicated, you may want to try a walk in clinic. These are popping up all over the country in pharmacies, drugstores, and shopping centers. They're usually staffed by nurse practitioners or physician assistants that have been trained to treat common illnesses and complaints. They're usually fairly quick and inexpensive. However, if you have serious medical issues or chronic medical problems, these are probably not your best option.  No Primary Care Doctor: - Call Health Connect at  [phone] - they can help you locate a primary care doctor that  accepts your insurance, provides certain services, etc. - Physician Referral Service- [phone]  Chronic Pain Problems: Organization         Address  Phone   Notes  [facility]  [phone] Patients need to be referred by their primary care doctor.   Medication Assistance: Organization         Address  Phone   Notes  [facility] Medication [facility] [address] [place], [place] [address] [phone] --Must be a resident of [facility] -- Must have NO insurance coverage whatsoever (no Medicaid/ Medicare, etc.) -- The pt. MUST have a primary care doctor that directs their care regularly and follows them in the community   [facility]  [phone]   [employer]  [phone]    Agencies that provide inexpensive medical care: Organization         Address  Phone   Notes  [facility]  [phone]   [facility]    [phone]   [facility] [address] [place], [place] [address] [phone]   [facility] [address] [place], [place] [phone]   Planned Parenthood    [phone]   [facility]    [phone]   Community Health and [facility]  [address], [place] Phone:  [phone], Fax:  [phone] Hours of Operation:  9 am - 6 pm, M-F.  Also  accepts Medicaid/Medicare and self-pay.  [facility]  [address], [place] Phone: [phone], Fax: [phone]. Hours of Operation:  8:30 am - 5:30 pm, M-F.  Also accepts Medicaid and self-pay.  [facility] [place] [address] [place] Phone: [phone]   [facility] [address] Natasha Bence [place], [place] [phone], Ext. [phone] Mondays & Thursdays: 7-9 AM.  First 15 patients are seen on a first come, first serve basis.    Medicaid-accepting [facility] Providers:  Organization         Address  Phone   Notes  [facility] [address], [place] [phone] Also accepts self-pay patients.  [facility] [address] Laurell Josephs [place], [place]  [phone]   [facility] [address], [place] [phone]   [facility] [address], [place] [phone]   Renaye Rakers [address], [place]   [phone] Only accepts [place] Access [place] patients after they have their name applied to their card.   Self-Pay (no insurance) in [facility]:  Organization         Address  Phone   Notes  Sickle Cell Patients, [facility] Internal Medicine [address] [place], [place] [phone]   [facility] [address] [place], [place] [phone]   [facility] [place]  [address], [place] [phone]   [facility]/Dr. Osei-Bonsu  [address], [place] or [address], [place] [phone] Phone number for both [place] and [place] locations is the same.  [facility]gent [facility] and [facility] [address], [place] [phone]   [facility] [address], [place] or [address] [phone]   [facility] [address] [place] [phone], phone; [phone], fax Sees patients 1st and 3rd Saturday of every month.  Must not qualify for public or private insurance (i.e. Medicaid, Medicare, [place] Health Choice, Veterans' Benefits)  Household income should be no more than 200% of the poverty level The clinic cannot treat you if you are pregnant or think you are pregnant  Sexually transmitted diseases are not treated at the clinic.    Dental Care: Organization         Address  Phone  Notes  [facility] Department of [facility] [address] [place], [place] [phone] Accepts children up to age 35 who are enrolled in [place] or [place] Health Choice; pregnant women with a Medicaid card; and children who have applied for Medicaid or [place] Health Choice, but were declined, whose parents can pay a reduced fee at time of service.  [facility] of [facility]  [address], [place] [phone] Accepts  children up to age 71 who are enrolled in [place] or [place] Health Choice; pregnant women with a Medicaid card; and children who have applied for Medicaid or [place] Health Choice, but were declined, whose parents can pay a reduced fee at time of service.  [facility]  [address] [place], [place] [phone] Patients are seen by appointment only. Walk-ins are not accepted. [facility] will see patients 64 years of age and older. Monday - Tuesday (8am-5pm)  Most Wednesdays (8:30-5pm) $30 per visit, cash only  [facility]  [address], [facility] [phone] Patients are seen by appointment only. Walk-ins are not accepted. [facility] will see patients 26 years of age and older. One Wednesday Evening (Monthly: Volunteer Based).  $30 per visit, cash only  [employer] [facility]  [phone] for adults; Children under age 744, call Graduate Pediatric Dentistry at [phone]. Children aged 264-14, please call [phone] to request a pediatric application.  Dental services are provided in all areas of dental care including fillings, crowns and bridges, complete and partial dentures, implants, gum treatment, root canals, and extractions. Preventive care is also provided. Treatment is provided to both adults and children. Patients are selected via a lottery and there is often a waiting list.   [facility] [address], [place]  [phone] [email or web address]   [facility] [address], [place], [place] [phone], Ext. [phone] Second and Fourth Thursday of each month, opens at 6:30 AM; Clinic ends at 9 AM.  Patients are seen on a first-come first-served basis, and a limited number are seen during each clinic.   [facility]  [address], [place], [place] [phone]   Eligibility Requirements You must have lived in [place], [place], or [place] counties for at least the  last three months.   You cannot be eligible for state or federal sponsored [place]healthcare insurance, including CIGNAVeterans Administration, [place]icaid, or [employer]icare.   You generally cannot be eligible for healthcare insurance through your employer.    How to apply: Eligibility screenings are held every Tuesday and Wednesday afternoon from 1:00 pm until 4:00 pm. You do not need an appointment for the interview!  [facility] [address], [place], [place] [phone]   [facility]  [phone]   [facility]  [phone]   [facility]  [phone]    Behavioral Health Resources in the Community: Intensive Outpatient Programs Organization         Address  Phone  Notes  [facility] [address], [place], [place] [phone]   [facility] [address], [place], [place] [phone]   [facility] [address], [place], [place]  [phone]   [facility] [address],  [place], [place] [phone] or [phone]   Substance Abuse Resources Organization         Address  Phone  Notes  [facility]  [phone]   [facility]  [phone]   [facility]  [phone]   [facility]  [phone]   Residential & Outpatient Substance Abuse Program  [phone]   Psychological Services Organization         Address  Phone  Notes  [facility]  [phone]- [phone]   [facility]  [phone]- [phone]   [facility] [address], [place] [phone] or [phone]    Mobile Crisis Teams Organization         Address  Phone  Notes  [facility], Mobile Crisis Care Unit  [phone]   [facility]  [address] [place], [place] [phone]   Doristine LocksSharon DeEsch [address] [place] [phone]     Self-Help/Support Groups Organization         Address  Phone  Notes  [facility] [place] - variety of support groups  [place] Call for more information  Narcotics Anonymous (NA), [facility]aring [facility] [address], [employer] [place]  2 meetings at this location   Special educational needs teacher         Address  Phone  Notes  [facility] [place],    [place]  [phone]   [facility]  [address] [place] [address], [place], [place]   [place], [place] [phone] Admissions: 8am-3pm M-F  Incentives Substance [place] [address]    [place], [place] [phone]   [facility] [address] [place], [employer] [employer], [place]   [facility] [address]  [place], [place]   [facility] Programs - Intensive Outpatient [address], [place], [place]   [facility] ([place] [address]  [place], [place] [phone] or [phone]   [facility] (RTS) [address], [place], [place] Accepts Medicaid  [facility] [address],  [place] [phone] Substance Abuse/Addiction Treatment   [facility] [place] Organization         Address  Phone  Notes  [facility]  [phone]   Domenic Schwab, PhD [address] Arlis Porta [place], [place]   [phone] or [phone]   [address] [address] [place], [place] [phone]   [facility] [address], [place], [place] [phone] Insurance/Medicaid/sponsorship through [facility] Families [address] [place]                                    [place], [place] [phone] [place] [address], [place] [phone]    Dr. Adele Schilder  [phone]   [facility]  [facility] [place] Dept. 1) [address] [place] 2) [place] 3)  [place] [address], [place] [phone]  [phone]   [place] [phone] or [phone] (After Hours)

## 2015-08-16 NOTE — ED Notes (Signed)
Pt c/o N/V/D and abdominal pain.  Pt reports not being able to keep anything down.

## 2016-06-26 ENCOUNTER — Emergency Department (HOSPITAL_COMMUNITY)
Admission: EM | Admit: 2016-06-26 | Discharge: 2016-06-26 | Disposition: A | Discharge status: Home / Self Care | Payer: Medicaid Other | Attending: Emergency Medicine | Admitting: Emergency Medicine

## 2016-06-26 ENCOUNTER — Encounter (HOSPITAL_COMMUNITY): Payer: Self-pay

## 2016-06-26 ENCOUNTER — Emergency Department (HOSPITAL_COMMUNITY): Payer: Medicaid Other

## 2016-06-26 DIAGNOSIS — Z79899 Other long term (current) drug therapy: Secondary | ICD-10-CM | POA: Insufficient documentation

## 2016-06-26 DIAGNOSIS — J45909 Unspecified asthma, uncomplicated: Secondary | ICD-10-CM

## 2016-06-26 DIAGNOSIS — F17210 Nicotine dependence, cigarettes, uncomplicated: Secondary | ICD-10-CM | POA: Insufficient documentation

## 2016-06-26 DIAGNOSIS — K529 Noninfective gastroenteritis and colitis, unspecified: Secondary | ICD-10-CM

## 2016-06-26 DIAGNOSIS — I10 Essential (primary) hypertension: Secondary | ICD-10-CM | POA: Insufficient documentation

## 2016-06-26 DIAGNOSIS — O2342 Unspecified infection of urinary tract in pregnancy, second trimester: Secondary | ICD-10-CM

## 2016-06-26 DIAGNOSIS — J45901 Unspecified asthma with (acute) exacerbation: Secondary | ICD-10-CM | POA: Insufficient documentation

## 2016-06-26 MED ORDER — ALBUTEROL (5 MG/ML) CONTINUOUS INHALATION SOLN: 10.0000 mg/h | INHALATION_SOLUTION | RESPIRATORY_TRACT | Status: DC

## 2016-06-26 MED ORDER — MAGNESIUM SULFATE 2 GM/50ML IV SOLN
2.0000 g | Freq: Once | INTRAVENOUS | Status: AC
  Administered 2016-06-26: 2 g via INTRAVENOUS
  Filled 2016-06-26: qty 50

## 2016-06-26 MED ORDER — METHYLPREDNISOLONE SODIUM SUCC 125 MG IJ SOLR
80.0000 mg | Freq: Once | INTRAMUSCULAR | Status: AC
  Administered 2016-06-26: 80 mg via INTRAVENOUS
  Filled 2016-06-26: qty 2

## 2016-06-26 MED ORDER — IPRATROPIUM-ALBUTEROL 0.5-2.5 (3) MG/3ML IN SOLN
3.0000 mL | Freq: Once | RESPIRATORY_TRACT | Status: AC
  Administered 2016-06-26: 3 mL via RESPIRATORY_TRACT
  Filled 2016-06-26: qty 3

## 2016-06-26 MED ORDER — ALBUTEROL SULFATE (5 MG/ML) 0.5% IN NEBU: 2.5000 mg | INHALATION_SOLUTION | Freq: Four times a day (QID) | RESPIRATORY_TRACT | 12 refills | Status: DC | PRN

## 2016-06-26 MED ORDER — MAGNESIUM SULFATE 50 % IJ SOLN: 2.0000 g | Freq: Once | INTRAMUSCULAR | Status: DC

## 2016-06-26 MED ORDER — ALBUTEROL SULFATE HFA 108 (90 BASE) MCG/ACT IN AERS
2.0000 | INHALATION_SPRAY | Freq: Four times a day (QID) | RESPIRATORY_TRACT | Status: DC | PRN
  Administered 2016-06-26: 2 via RESPIRATORY_TRACT
  Filled 2016-06-26: qty 6.7

## 2016-06-26 MED ORDER — PREDNISONE 20 MG PO TABS: 40.0000 mg | ORAL_TABLET | Freq: Every day | ORAL | 0 refills | Status: AC

## 2016-06-26 MED ORDER — ALBUTEROL (5 MG/ML) CONTINUOUS INHALATION SOLN
INHALATION_SOLUTION | RESPIRATORY_TRACT | Status: AC
  Filled 2016-06-26: qty 20

## 2016-06-26 NOTE — ED Triage Notes (Signed)
Pt complains of being short of breath and wheezing, no relief with inhaler and she did a breathing treatment this am and she becomes tachycardic

## 2016-06-26 NOTE — ED Provider Notes (Signed)
[facility]-EMERGENCY DEPT Provider Note   CSN: [id number] Arrival date & time: 06/26/16  2010 By signing my name below, I, [name], attest that this documentation has been prepared under the direction and in the presence of non-physician practitioner, Arvilla MeresAshley Meyer, PA-C.  Electronically Signed: Levon HedgerElizabeth [name], Scribe. 06/26/2016. 8:53 PM.   History   Chief Complaint Chief Complaint  Patient presents with  . Asthma    HPI [name] is a 25 y.o. female with hx of asthma who presents to the Emergency Department complaining of worsening, severe shortness of breath which began three days ago. Three days ago, pt bleached her basement to eliminate mold which she states precipitated her SOB.  Pt notes associated wheezing, productive cough with green sputum - she has had recent URI like symptoms, posttussive emesis, and chest tightness.  Pt also notes sudden onset, left lateral rib pain; she felt "something pop" in her left ribs while coughing; describes the pain as sharp and exacerbated by movement. She denies chest pain, nausea, abdominal pain, new leg swelling, LOC, rashes, blurred or double vision. Pt completed a nebulizer treatment this morning and has used her inhaler with some initial relief, but states they are no longer alleviating her symptoms.  No other alleviating or modifying factors noted. She reports having asthma exacerbations this severe twice per year.  The history is provided by the patient. No language interpreter was used.    Past Medical History:  Diagnosis Date  . Animal bite of finger    a cat  . Asthma   . Bronchitis   . Endometriosis   . PCOS (polycystic ovarian syndrome)     Patient Active Problem List   Diagnosis Date Noted  . Asthma exacerbation 04/27/2015  . Thrombocytopenia (HCC)   . Shortness of breath   . Essential hypertension   . Asthma   . Hypokalemia   . Status post cesarean section 01/20/2014  . Pregnancy 01/18/2014  . Allergic rhinitis,  seasonal 12/04/2013  . Tobacco use disorder complicating pregnancy, childbirth, or the puerperium, antepartum condition or complication 07/11/2013  . Rh negative state in antepartum period 06/13/2013  . Supervision of normal pregnancy in first trimester 06/11/2013  . Obesity 06/11/2013  . Smoker 06/11/2013    Past Surgical History:  Procedure Laterality Date  . CESAREAN SECTION N/A 01/20/2014   Procedure: CESAREAN SECTION;  Surgeon: Brock Badharles A Harper, MD;  Location: [facility];  Service: Obstetrics;  Laterality: N/A;  . MOUTH SURGERY    . WISDOM TOOTH EXTRACTION      OB History    Gravida Para Term Preterm AB Living   1 1 1     1    SAB TAB Ectopic Multiple Live Births           1       Home Medications    Prior to Admission medications   Medication Sig Start Date End Date Taking? Authorizing Provider  etonogestrel-ethinyl estradiol (NUVARING) 0.12-0.015 MG/24HR vaginal ring Place 1 each vaginally every 28 (twenty-eight) days. Insert vaginally and leave in place for 3 consecutive weeks, then remove for 1 week.   Yes Historical Provider, MD  guaiFENesin (MUCINEX) 600 MG 12 hr tablet Take 600 mg by mouth 2 (two) times daily as needed for cough.   Yes Historical Provider, MD  albuterol (PROVENTIL) (5 MG/ML) 0.5% nebulizer solution Take 0.5 mLs (2.5 mg total) by nebulization every 6 (six) hours as needed for wheezing or shortness of breath. 06/26/16   Cassie KettleAshley Laurel Meyer,  PA-C  bismuth subsalicylate (PEPTO BISMOL) 262 MG/15ML suspension Take 30 mLs by mouth every 6 (six) hours as needed (for upset stomach).    Historical Provider, MD  calcium carbonate (TUMS - DOSED IN MG ELEMENTAL CALCIUM) 500 MG chewable tablet Chew 1-2 tablets by mouth 4 (four) times daily as needed for indigestion or heartburn.    Historical Provider, MD  dicyclomine (BENTYL) 20 MG tablet Take 1 tablet (20 mg total) by mouth 3 (three) times daily before meals. Patient not taking: Reported on 06/26/2016 08/16/15   Kristen N  Ward, DO  ibuprofen (ADVIL,MOTRIN) 200 MG tablet Take 600 mg by mouth every 6 (six) hours as needed for moderate pain.     Historical Provider, MD  LORazepam (ATIVAN) 2 MG tablet Take 2 mg by mouth every 6 (six) hours as needed for anxiety.    Historical Provider, MD  ondansetron (ZOFRAN ODT) 4 MG disintegrating tablet Take 1 tablet (4 mg total) by mouth every 8 (eight) hours as needed for nausea or vomiting. Patient not taking: Reported on 06/26/2016 08/16/15   Kristen N Ward, DO  predniSONE (DELTASONE) 20 MG tablet Take 2 tablets (40 mg total) by mouth daily. 06/26/16 07/01/16  Cassie Kettle, PA-C  promethazine (PHENERGAN) 25 MG tablet Take 1 tablet (25 mg total) by mouth every 6 (six) hours as needed for nausea or vomiting. Patient not taking: Reported on 06/26/2016 08/16/15   [name], DO    Family History Family History  Problem Relation Age of Onset  . Asthma Mother   . Diabetes Father   . Heart attack Maternal Grandfather   . Diabetes Paternal Grandmother   . Heart attack Paternal Grandfather     Social History Social History  Substance Use Topics  . Smoking status: Current Every Day Smoker    Packs/day: 0.25    Types: Cigarettes  . Smokeless tobacco: Never Used  . Alcohol use Yes     Allergies   Patient has no known allergies.   Review of Systems Review of Systems  Constitutional: Negative for chills, diaphoresis and fever.  HENT: Negative for trouble swallowing.        URI sxs  Eyes: Negative for visual disturbance.  Respiratory: Positive for cough, chest tightness, shortness of breath and wheezing.   Cardiovascular: Negative for chest pain.  Gastrointestinal: Negative for abdominal pain, blood in stool, constipation, diarrhea, nausea and vomiting.  Genitourinary: Negative for dysuria and hematuria.  Musculoskeletal: Negative for neck pain.  Skin: Negative for rash.  Neurological: Negative for syncope and headaches.     Physical Exam Updated Vital  Signs BP 115/78 (BP Location: Right Arm)   Pulse 101   Temp 98.7 F (37.1 C) (Oral)   Resp 20   LMP 06/26/2016   SpO2 98%   Physical Exam  Constitutional: She appears well-developed and well-nourished. She appears distressed.  HENT:  Head: Normocephalic and atraumatic.  Mouth/Throat: Oropharynx is clear and moist. No oropharyngeal exudate.  Eyes: Conjunctivae and EOM are normal. Pupils are equal, round, and reactive to light. Right eye exhibits no discharge. Left eye exhibits no discharge. No scleral icterus.  Neck: Normal range of motion and phonation normal. Neck supple. No neck rigidity. Normal range of motion present.  Cardiovascular: Regular rhythm, normal heart sounds and intact distal pulses.  Tachycardia present.   No murmur heard. Pulmonary/Chest: Accessory muscle usage present. No stridor. Tachypnea noted. She has decreased breath sounds. She has wheezes.  Patient has increased work of breathing with  accessory muscle use on initial assessment. She can only speak 2-3 words between breaths. Tachypnea. No hypoxia. Decreased breath sounds and wheezes, ?rhonchi.   Abdominal: Soft. Bowel sounds are normal. She exhibits no distension. There is tenderness. There is no rigidity, no rebound, no guarding and no CVA tenderness.    Musculoskeletal: Normal range of motion.  Lymphadenopathy:    She has no cervical adenopathy.  Neurological: She is alert. She is not disoriented. Coordination and gait normal. GCS eye subscore is 4. GCS verbal subscore is 5. GCS motor subscore is 6.  Skin: Skin is warm and dry.  Psychiatric: She has a normal mood and affect. Her behavior is normal.     ED Treatments / Results  DIAGNOSTIC STUDIES:  Oxygen Saturation is 98% on RA, normal by my interpretation.    COORDINATION OF CARE:  8:48 PM Discussed treatment plan with pt at bedside and pt agreed to plan.  Labs (all labs ordered are listed, but only abnormal results are displayed) Labs Reviewed -  No data to display  EKG  EKG Interpretation None       Radiology Dg Chest [facility] 1 View  Result Date: 06/26/2016 CLINICAL DATA:  History of asthma presenting of worsening dyspnea beginning 3 days ago. Patient was cleaning with bleach for mold in her basement. Patient notes wheezing and cough. EXAM: PORTABLE CHEST 1 VIEW COMPARISON:  None. FINDINGS: The heart size and mediastinal contours are within normal limits. No pulmonary consolidations. Mild increase in interstitial prominence noted bilaterally which may represent bronchitic change and small airway inflammation. The visualized skeletal structures are unremarkable. IMPRESSION: Bilateral increase in interstitial lung markings which may represent bronchitic/small airway inflammatory change. Electronically Signed   By: Tollie Eth M.D.   On: 06/26/2016 21:31    Procedures Procedures (including critical care time)  Medications Ordered in ED Medications  methylPREDNISolone sodium succinate (SOLU-MEDROL) 125 mg/2 mL injection 80 mg (80 mg Intravenous Given 06/26/16 2043)  magnesium sulfate IVPB 2 g 50 mL (0 g Intravenous Stopped 06/26/16 2158)  ipratropium-albuterol (DUONEB) 0.5-2.5 (3) MG/3ML nebulizer solution 3 mL (3 mLs Nebulization Given 06/26/16 2240)     Initial Impression / Assessment and Plan / ED Course  I have reviewed the triage vital signs and the nursing notes.  Pertinent labs & imaging results that were available during my care of the patient were reviewed by me and considered in my medical decision making (see chart for details).  Clinical Course as of Jun 27 1834  [facility] Jun 26, 2016  2200 No obvious focal consolidation.  DG Chest Port 1 View [AM]  2212 On re-evaluation patient endorses improvement in breathing back to 3/4 from baseline. Improved breath sounds, however, still wheezing on exam.   [AM]  2315 On re-evaluation patient endorses improvement in breathing to 1/10 from baseline. Patient endorses improvement in chest  tightness. She is able to speak in full sentences. O2 sats >95%, respirations unlabored. Improved breath sounds b/l. Mild wheezing still appreciated on exam. Pt requesting to go home  [AM]    Clinical Course User Index [AM] Cassie Kettle, PA-C    Patient presents to ED with complaint of asthma exacerbation onset approximately three days ago following bleaching basement to get rid of mold. Patient is afebrile and non-toxic, non-septic appearing; however in distress with increased work of breathing only able to speak 2-3 words between breaths. She is tachycardic, which may be secondary to albuterol and nebulizer treatment used earlier, and tachypneic. No hypoxia. Chest  is tight with decreased breath sounds and wheezing noted. Continuous neb started, IV solu-medrol, and 2g mg initiated. Given recent URI sxs will check CXR to r/o PNA. Discussed patient with Dr. Criss AlvineGoldston who also saw patient, agrees with plan.   CXR negative for focal infiltrate. Patient endorses significant improvement in chest tightness and breathing following treatment. She is able to speak in full sentences. No hypoxia. On exam improved breath sounds, wheezing still appreciated. Will give duo-neb treatment. Patient has point tenderness in LUQ; this is at the location she reports feeling a "pop" after coughing; pain is sharp and reproducible worse with movement - suspect MSK, recommended motrin and tylenol for relief.   Following duo-neb patient is resting comfortably in bed, reclining. No hypoxia. She endorses further improvement in breathing and chest tightness. Improved breath sounds, some wheezing still appreciated. Patient is requesting to go home. Continue nebulizer every 3-4x daily as needed for sx relief (rx provided). Continue albuterol inhaler as needed. Rx PO steroids for 5 days. Close follow up with PCP. Strict return precautions discussed. Patient voiced understanding and is agreeable. Patient is hemodynamically stable in  NAD, no hypoxia,  respirations unlabored at this time, patient appears safe for discharge.   Final Clinical Impressions(s) / ED Diagnoses   Final diagnoses:  Exacerbation of asthma, unspecified asthma severity, unspecified whether persistent    New Prescriptions Discharge Medication List as of 06/26/2016 11:38 PM    START taking these medications   Details  albuterol (PROVENTIL) (5 MG/ML) 0.5% nebulizer solution Take 0.5 mLs (2.5 mg total) by nebulization every 6 (six) hours as needed for wheezing or shortness of breath., Starting Mon 06/26/2016, Print    predniSONE (DELTASONE) 20 MG tablet Take 2 tablets (40 mg total) by mouth daily., Starting Mon 06/26/2016, Until Sat 07/01/2016, Print      I personally performed the services described in this documentation, which was scribed in my presence. The recorded information has been reviewed and is accurate.    Cassie KettleAshley Laurel Meyer, PA-C 06/27/16 1854    Pricilla LovelessScott Goldston, MD 06/28/16 [phone]

## 2016-06-26 NOTE — Discharge Instructions (Signed)
Read the information below.  Your x-ray did not show any evidence of pneumonia. You can complete albuterol nebulizer treatments 3-4 times daily as needed. Use inhaler as needed. You are being prescribed a short course of oral steroids. Please take as directed.  Be sure to follow up with your primary provider later this week for re-evaluation.  Use the prescribed medication as directed.  Please discuss all new medications with your pharmacist.   You may return to the Emergency Department at any time for worsening condition or any new symptoms that concern you. Return to ED if you develop fever, worsening shortness of breath, wheezing, chest pain, or any other new/concerning symptoms.

## 2016-06-26 NOTE — ED Notes (Signed)
Pt states that she feels much better. PA made aware. Pt work of breathing has decreased. No longer in distress.

## 2016-09-24 ENCOUNTER — Encounter (HOSPITAL_COMMUNITY): Payer: Self-pay | Admitting: Emergency Medicine

## 2016-09-24 ENCOUNTER — Emergency Department (HOSPITAL_COMMUNITY): Payer: 59

## 2016-09-24 ENCOUNTER — Emergency Department (HOSPITAL_COMMUNITY)
Admission: EM | Admit: 2016-09-24 | Discharge: 2016-09-25 | Disposition: A | Discharge status: Home / Self Care | Payer: 59 | Attending: Emergency Medicine | Admitting: Emergency Medicine

## 2016-09-24 DIAGNOSIS — I10 Essential (primary) hypertension: Secondary | ICD-10-CM | POA: Diagnosis not present

## 2016-09-24 DIAGNOSIS — J45909 Unspecified asthma, uncomplicated: Secondary | ICD-10-CM | POA: Insufficient documentation

## 2016-09-24 DIAGNOSIS — B9689 Other specified bacterial agents as the cause of diseases classified elsewhere: Secondary | ICD-10-CM | POA: Diagnosis not present

## 2016-09-24 DIAGNOSIS — F17210 Nicotine dependence, cigarettes, uncomplicated: Secondary | ICD-10-CM | POA: Diagnosis not present

## 2016-09-24 DIAGNOSIS — R102 Pelvic and perineal pain: Secondary | ICD-10-CM | POA: Diagnosis not present

## 2016-09-24 DIAGNOSIS — Z8742 Personal history of other diseases of the female genital tract: Secondary | ICD-10-CM | POA: Diagnosis not present

## 2016-09-24 DIAGNOSIS — N760 Acute vaginitis: Secondary | ICD-10-CM | POA: Insufficient documentation

## 2016-09-24 DIAGNOSIS — R1030 Lower abdominal pain, unspecified: Secondary | ICD-10-CM | POA: Diagnosis present

## 2016-09-24 HISTORY — DX: Anxiety disorder, unspecified: F41.9

## 2016-09-24 LAB — WET PREP, GENITAL
Sperm: NONE SEEN
Trich, Wet Prep: NONE SEEN
Yeast Wet Prep HPF POC: NONE SEEN

## 2016-09-24 LAB — COMPREHENSIVE METABOLIC PANEL
ALBUMIN: 3.8 g/dL (ref 3.5–5.0)
ALT: 17 U/L (ref 14–54)
AST: 23 U/L (ref 15–41)
Alkaline Phosphatase: 67 U/L (ref 38–126)
Anion gap: 5 (ref 5–15)
BILIRUBIN TOTAL: 0.5 mg/dL (ref 0.3–1.2)
BUN: 14 mg/dL (ref 6–20)
CHLORIDE: 104 mmol/L (ref 101–111)
CO2: 23 mmol/L (ref 22–32)
Calcium: 9.1 mg/dL (ref 8.9–10.3)
Creatinine, Ser: 0.7 mg/dL (ref 0.44–1.00)
GFR calc Af Amer: >60 mL/min (ref >60)
GFR calc non Af Amer: >60 mL/min (ref >60)
GLUCOSE: 94 mg/dL (ref 65–99)
POTASSIUM: 3.9 mmol/L (ref 3.5–5.1)
Sodium: 132 mmol/L — ABNORMAL LOW (ref 135–145)
TOTAL PROTEIN: 7 g/dL (ref 6.5–8.1)

## 2016-09-24 LAB — CBC WITH DIFFERENTIAL/PLATELET
Basophils Absolute: 0 10*3/uL (ref 0.0–0.1)
Basophils Relative: 1 %
EOS ABS: 0.3 10*3/uL (ref 0.0–0.7)
Eosinophils Relative: 4 %
HEMATOCRIT: 40.9 % (ref 36.0–46.0)
HEMOGLOBIN: 14.1 g/dL (ref 12.0–15.0)
LYMPHS ABS: 3 10*3/uL (ref 0.7–4.0)
LYMPHS PCT: 40 %
MCH: 29 pg (ref 26.0–34.0)
MCHC: 34.5 g/dL (ref 30.0–36.0)
MCV: 84.2 fL (ref 78.0–100.0)
Monocytes Absolute: 0.7 10*3/uL (ref 0.1–1.0)
Monocytes Relative: 9 %
NEUTROS ABS: 3.6 10*3/uL (ref 1.7–7.7)
NEUTROS PCT: 46 %
Platelets: 206 10*3/uL (ref 150–400)
RBC: 4.86 MIL/uL (ref 3.87–5.11)
RDW: 12.2 % (ref 11.5–15.5)
WBC: 7.5 10*3/uL (ref 4.0–10.5)

## 2016-09-24 LAB — URINALYSIS, ROUTINE W REFLEX MICROSCOPIC
BACTERIA UA: NONE SEEN
Bilirubin Urine: NEGATIVE
Glucose, UA: NEGATIVE mg/dL
Hgb urine dipstick: NEGATIVE
KETONES UR: NEGATIVE mg/dL
Nitrite: NEGATIVE
PROTEIN: NEGATIVE mg/dL
Specific Gravity, Urine: >1.046 — ABNORMAL HIGH (ref 1.005–1.030)
pH: 5 (ref 5.0–8.0)

## 2016-09-24 LAB — I-STAT BETA HCG BLOOD, ED (MC, WL, AP ONLY)

## 2016-09-24 LAB — LIPASE, BLOOD: Lipase: 17 U/L (ref 11–51)

## 2016-09-24 MED ORDER — HYDROMORPHONE HCL 1 MG/ML IJ SOLN
1.0000 mg | Freq: Once | INTRAMUSCULAR | Status: AC
  Administered 2016-09-24: 1 mg via INTRAVENOUS
  Filled 2016-09-24: qty 1

## 2016-09-24 MED ORDER — HYDROMORPHONE HCL 1 MG/ML IJ SOLN
INTRAMUSCULAR | Status: AC
  Administered 2016-09-24: 1 mg via INTRAVENOUS
  Filled 2016-09-24: qty 1

## 2016-09-24 MED ORDER — SODIUM CHLORIDE 0.9 % IV BOLUS (SEPSIS)
1000.0000 mL | Freq: Once | INTRAVENOUS | Status: AC
  Administered 2016-09-24: 1000 mL via INTRAVENOUS

## 2016-09-24 MED ORDER — IOPAMIDOL (ISOVUE-300) INJECTION 61%
INTRAVENOUS | Status: AC
  Administered 2016-09-24: 100 mL
  Filled 2016-09-24: qty 100

## 2016-09-24 MED ORDER — ONDANSETRON HCL 4 MG/2ML IJ SOLN
4.0000 mg | Freq: Once | INTRAMUSCULAR | Status: AC
  Administered 2016-09-24: 4 mg via INTRAVENOUS
  Filled 2016-09-24: qty 2

## 2016-09-24 NOTE — ED Provider Notes (Signed)
Patient signed out of end of shift by Melburn HakeNicole Nadeau, PA-C. Patient came in for evaluation of RLQ pain, h/o PCOS, reports pain is different from her chronic pain. CT negative for appy. Lab results stable. Pending US pelvis, wet prep  Plan: review results - treat based on results  US shows no torsion, no mass, trace free fluid. Pain has been essentially controlled, coming back now on re-evaluation. Wet prep shows BV which we will treat with Metrogel. She will follow up with her GYN for likely PCOS pain.    Elpidio AnisShari Jacobe Study, PA-C 09/25/16 0034    Maia PlanJoshua G Long, MD 09/25/16 1130

## 2016-09-24 NOTE — ED Provider Notes (Signed)
[facility]-EMERGENCY DEPT Provider Note   CSN: [id number] Arrival date & time: 09/24/16  1843     History   Chief Complaint No chief complaint on file.   HPI [name] is a 26 y.o. female.  HPI   Patient is a 26 year old female with history of PCO S and endometriosis who presents with lower abdominal pain, onset this morning. Patient reports mild waxing and waning sharp stabbing right lower quadrant abdominal pain which she notes have progressively worsened throughout the day. Endorses associated nausea and multiple episodes of NBNB vomiting. Patient reports having ruptured ovarian cyst in the past ant notes that her pain today feels slightly different than her usual pain related to her PCOS. Denies taking any medications for symptoms. Denies fever, chills, chest pain, shortness of breath, cough, hematemesis, diarrhea, constipation, urinary symptoms, vaginal bleeding, vaginal discharge. Endorses abdominal surgical history of C-section. Patient reports her last menstrual period was over 3 years ago after initiation of Nuvaring.  Past Medical History:  Diagnosis Date  . Animal bite of finger    a cat  . Anxiety   . Asthma   . Bronchitis   . Endometriosis   . PCOS (polycystic ovarian syndrome)     Patient Active Problem List   Diagnosis Date Noted  . Asthma exacerbation 04/27/2015  . Thrombocytopenia (HCC)   . Shortness of breath   . Essential hypertension   . Asthma   . Hypokalemia   . Status post cesarean section 01/20/2014  . Pregnancy 01/18/2014  . Allergic rhinitis, seasonal 12/04/2013  . Tobacco use disorder complicating pregnancy, childbirth, or the puerperium, antepartum condition or complication 07/11/2013  . Rh negative state in antepartum period 06/13/2013  . Supervision of normal pregnancy in first trimester 06/11/2013  . Obesity 06/11/2013  . Smoker 06/11/2013    Past Surgical History:  Procedure Laterality Date  . CESAREAN SECTION N/A 01/20/2014   Procedure: CESAREAN SECTION;  Surgeon: Brock Bad, MD;  Location: [facility];  Service: Obstetrics;  Laterality: N/A;  . MOUTH SURGERY    . WISDOM TOOTH EXTRACTION      OB History    Gravida Para Term Preterm AB Living   1 1 1     1    SAB TAB Ectopic Multiple Live Births           1       Home Medications    Prior to Admission medications   Medication Sig Start Date End Date Taking? Authorizing Provider  albuterol (PROVENTIL) (5 MG/ML) 0.5% nebulizer solution Take 0.5 mLs (2.5 mg total) by nebulization every 6 (six) hours as needed for wheezing or shortness of breath. 06/26/16   Lona Kettle, PA-C  bismuth subsalicylate (PEPTO BISMOL) 262 MG/15ML suspension Take 30 mLs by mouth every 6 (six) hours as needed (for upset stomach).    Historical Provider, MD  calcium carbonate (TUMS - DOSED IN MG ELEMENTAL CALCIUM) 500 MG chewable tablet Chew 1-2 tablets by mouth 4 (four) times daily as needed for indigestion or heartburn.    Historical Provider, MD  dicyclomine (BENTYL) 20 MG tablet Take 1 tablet (20 mg total) by mouth 3 (three) times daily before meals. Patient not taking: Reported on 06/26/2016 08/16/15   [name], DO  etonogestrel-ethinyl estradiol (NUVARING) 0.12-0.015 MG/24HR vaginal ring Place 1 each vaginally every 28 (twenty-eight) days. Insert vaginally and leave in place for 3 consecutive weeks, then remove for 1 week.    Historical Provider, MD  guaiFENesin (MUCINEX) 600  MG 12 hr tablet Take 600 mg by mouth 2 (two) times daily as needed for cough.    Historical Provider, MD  ibuprofen (ADVIL,MOTRIN) 200 MG tablet Take 600 mg by mouth every 6 (six) hours as needed for moderate pain.     Historical Provider, MD  LORazepam (ATIVAN) 2 MG tablet Take 2 mg by mouth every 6 (six) hours as needed for anxiety.    Historical Provider, MD  ondansetron (ZOFRAN ODT) 4 MG disintegrating tablet Take 1 tablet (4 mg total) by mouth every 8 (eight) hours as needed for nausea or  vomiting. Patient not taking: Reported on 06/26/2016 08/16/15   Kristen N Ward, DO  promethazine (PHENERGAN) 25 MG tablet Take 1 tablet (25 mg total) by mouth every 6 (six) hours as needed for nausea or vomiting. Patient not taking: Reported on 06/26/2016 08/16/15   Layla MawKristen N Ward, DO    Family History Family History  Problem Relation Age of Onset  . Asthma Mother   . Diabetes Father   . Heart attack Maternal Grandfather   . Diabetes Paternal Grandmother   . Heart attack Paternal Grandfather     Social History Social History  Substance Use Topics  . Smoking status: Current Every Day Smoker    Packs/day: 0.25    Types: Cigarettes  . Smokeless tobacco: Never Used  . Alcohol use Yes     Allergies   Morphine and related   Review of Systems Review of Systems  Gastrointestinal: Positive for abdominal pain, nausea and vomiting.  All other systems reviewed and are negative.    Physical Exam Updated Vital Signs BP (!) 139/113 (BP Location: Right Arm)   Pulse 96   Temp 98.1 F (36.7 C) (Oral)   Resp 18   Ht 5\' 8"  (1.727 m)   Wt (!) 140.6 kg   SpO2 90%   BMI 47.14 kg/m   Physical Exam  Constitutional: She is oriented to person, place, and time. She appears well-developed and well-nourished.  HENT:  Head: Normocephalic and atraumatic.  Mouth/Throat: Oropharynx is clear and moist. No oropharyngeal exudate.  Eyes: Conjunctivae and EOM are normal. Right eye exhibits no discharge. Left eye exhibits no discharge. No scleral icterus.  Neck: Normal range of motion. Neck supple.  Cardiovascular: Normal rate, regular rhythm, normal heart sounds and intact distal pulses.   HR 88  Pulmonary/Chest: Effort normal and breath sounds normal. No respiratory distress. She has no wheezes. She has no rales. She exhibits no tenderness.  Abdominal: Soft. Bowel sounds are normal. She exhibits no distension and no mass. There is tenderness in the right lower quadrant. There is guarding. There  is no rigidity, no rebound and no CVA tenderness. No hernia.  Exquisite TTP over RLQ with guarding present.   Musculoskeletal: She exhibits no edema.  No midline C, T, or L tenderness. Mild TTP over bilateral lumbar paraspinal muscles. Full range of motion of neck and back. Full range of motion of bilateral upper and lower extremities, with 5/5 strength. Sensation intact. 2+ radial and PT pulses. Cap refill <2 seconds.    Lymphadenopathy:    She has no cervical adenopathy.  Neurological: She is alert and oriented to person, place, and time.  Skin: Skin is warm and dry.  Nursing note and vitals reviewed.  Pelvic exam: normal external genitalia, vulva, vagina, cervix, uterus and adnexa, VULVA: normal appearing vulva with no masses, tenderness or lesions, VAGINA: normal appearing vagina with normal color and discharge, no lesions, vaginal discharge -  white, CERVIX: normal appearing cervix without discharge or lesions, WET MOUNT done - results: pending, DNA probe for chlamydia and GC obtained, UTERUS: uterus is normal size, shape, consistency, mild TTP, ADNEXA: normal adnexa in size, nontender and no masses, tenderness right, exam chaperoned by female nurse.   ED Treatments / Results  Labs (all labs ordered are listed, but only abnormal results are displayed) Labs Reviewed  CBC WITH DIFFERENTIAL/PLATELET  COMPREHENSIVE METABOLIC PANEL  LIPASE, BLOOD  URINALYSIS, ROUTINE W REFLEX MICROSCOPIC  I-STAT BETA HCG BLOOD, ED ([facility], [facility], [facility] ONLY)    EKG  EKG Interpretation None       Radiology No results found.  Procedures Procedures (including critical care time)  Medications Ordered in ED Medications  sodium chloride 0.9 % bolus 1,000 mL (not administered)  ondansetron (ZOFRAN) injection 4 mg (not administered)  HYDROmorphone (DILAUDID) injection 1 mg (not administered)     Initial Impression / Assessment and Plan / ED Course  I have reviewed the triage vital signs and the nursing  notes.  Pertinent labs & imaging results that were available during my care of the patient were reviewed by me and considered in my medical decision making (see chart for details).     Patient presents with right lower abdominal pain with associated nausea and vomiting that started this morning and has gradually worsened. Reports history of PCOS and endometriosis but states this pain feels different than her typical pain. VSS. Exam revealed exquisite TTP over RLQ with guarding present. Remaining exam unremarkable. Patient given IV fluids, dilaudid and zofran. Pregnancy negative. Labs unremarkable. Due to pt with severe RLQ tenderness with guarding will order CT abdomen for further evaluation. CT abdomen unremarkable, nml appendix. Scattered tiny pulmonary nodules at the right lung base, largest 4 mm, probably benign. No chest CT follow-up is required unless the patient has significant risk factors for lung malignancy. Pt without any risk factors for lung CA. On reevaluation pt reports mild intermittent improvement of pain s/p initial pain meds but states pain has since worsened, pt given second dose. Pelvic exam showed small amount of discharge in vaginal vault, severe right adnexal tenderness. Wet prep and GC/Chlamydia swabs pending. Will order pelvic US for further evaluation due to pt with severe right adnexal tenderness.   Hand-off to [employer], PA-C. Pending wet prep and pelvic US. Dispo pending findings. If US unremarkable, plan to d/c pt home with pain meds and outpatient with OBGYN.  Final Clinical Impressions(s) / ED Diagnoses   Final diagnoses:  None    New Prescriptions New Prescriptions   No medications on file     Barrett Henle, PA-C 09/24/16 2232    Maia Plan, MD 09/25/16 1130

## 2016-09-24 NOTE — ED Notes (Signed)
Was going to give patient pain medicine and hooked it up to IV port and let go of the plunger and the medication spilt into the floor.  Will override another dilaudid.  Pt witnessed event.

## 2016-09-24 NOTE — ED Notes (Signed)
Patient transported to Ultrasound 

## 2016-09-24 NOTE — ED Triage Notes (Signed)
Pt c/o lower right sided abdominal pain and vomiting from the pain onset 2-3 days ago. Pain hurts less with pressure. Pt reports hx of rupturing ovarian cysts.

## 2016-09-24 NOTE — ED Notes (Signed)
Pt instructed to use call bell to notify staff when she can provide urine specimen.

## 2016-09-25 LAB — GC/CHLAMYDIA PROBE AMP (~~LOC~~) NOT AT ARMC
CHLAMYDIA, DNA PROBE: NEGATIVE
Neisseria Gonorrhea: NEGATIVE

## 2016-09-25 MED ORDER — OXYCODONE-ACETAMINOPHEN 5-325 MG PO TABS: 1.0000 | ORAL_TABLET | ORAL | 0 refills | Status: DC | PRN

## 2016-09-25 MED ORDER — KETOROLAC TROMETHAMINE 30 MG/ML IJ SOLN
30.0000 mg | Freq: Once | INTRAMUSCULAR | Status: AC
  Administered 2016-09-25: 30 mg via INTRAVENOUS
  Filled 2016-09-25: qty 1

## 2016-09-25 MED ORDER — IBUPROFEN 800 MG PO TABS: 800.0000 mg | ORAL_TABLET | Freq: Three times a day (TID) | ORAL | 0 refills | Status: DC

## 2016-09-25 MED ORDER — METRONIDAZOLE 0.75 % VA GEL: 1.0000 | Freq: Two times a day (BID) | VAGINAL | 0 refills | Status: DC

## 2016-09-25 NOTE — ED Notes (Signed)
Pt ambulatory and independent at discharge.  Verbalized understanding of discharge instructions 

## 2018-08-29 ENCOUNTER — Telehealth: Payer: 59 | Admitting: Physician Assistant

## 2018-08-29 DIAGNOSIS — T753XXA Motion sickness, initial encounter: Secondary | ICD-10-CM | POA: Diagnosis not present

## 2018-08-29 MED ORDER — SCOPOLAMINE 1 MG/3DAYS TD PT72: 1.0000 | MEDICATED_PATCH | TRANSDERMAL | 0 refills | Status: DC

## 2018-08-29 NOTE — Progress Notes (Signed)
E Visit for Motion Sickness  We are sorry that you are not feeling well. Here is how we plan to help!  Based on what you have shared with me it looks like you have symptoms of motion sickness.  I have prescribed a medication that will help prevent or alleviate your symptoms:  Scopolamine Transdermal 1 mg patch behind ear at least 4 hours prior to travel (preferably 12 hours)   Prevention:  You might feel better if you keep your eyes focused on outside while you are in motion. For example, if you are in a car, sit in the front and look in the direction you are moving; if you are on a boat, stay on the deck and look to the horizon. This helps make what you see match the movement you are feeling, and so you are less likely to feel sick.  You should also avoid reading, watching a movie, texting or reading messages, or looking at things close to you inside the vehicle you are riding in.  . Use the seat head rest. Lean your head against the back of the seat or head rest when traveling in vehicles with seats to minimize head movements.  . On a ship: When making your reservations, choose a cabin in the middle of the ship and near the waterline. When on board, go up on deck and focus on the horizon.  . In an airplane: Request a window seat and look out the window. A seat over the front edge of the wing is the most preferable spot (the degree of motion is the lowest here). Direct the air vent to blow cool air on your face.  . On a train: Always face forward and sit near a window.  . In a vehicle: Sit in the front seat; if you are the passenger, look at the scenery in the distance. For some people, driving the vehicle (rather than being a passenger) is an instant remedy.  . Avoid others who have become nauseous with motion sickness. Seeing and smelling others who have motion sickness may cause you to become sick.  GET HELP RIGHT AWAY IF:   Your symptoms do not improve or worsen within 2 days  after treatment.   You cannot keep down fluids after trying the medication.   Other associated symptoms such as severe headache, visual field changes, fever, or intractable nausea and vomiting.  MAKE SURE YOU:   Understand these instructions.  Will watch your condition.  Will get help right away if you are not doing well or get worse.  Thank you for choosing an e-visit.  Your e-visit answers were reviewed by a board certified advanced clinical practitioner to complete your personal care plan. Depending upon the condition, your plan could have included both over the counter or prescription medications.  Please review your pharmacy choice. Be sure that the pharmacy you have chosen is open so that you can pick up your prescription now.  If there is a problem you may message your provider in MyChart to have the prescription routed to another pharmacy.  Your safety is important to us. If you have drug allergies check your prescription carefully.   For the next 24 hours, you can use MyChart to ask questions about today's visit, request a non-urgent call back, or ask for a work or school excuse from your e-visit provider.  You will get an e-mail in the next two days asking about your experience. I hope that your e-visit has been   valuable and will speed your recovery.   References or for more information: https://wwwnc.cdc.gov/travel/yellowbook/2020/travel-by-air-land-sea/motion-sickness https://my.[facility].org/health/articles/12782-motion-sickness https://www.uptodate.com  

## 2019-05-21 DIAGNOSIS — F411 Generalized anxiety disorder: Secondary | ICD-10-CM | POA: Diagnosis not present

## 2019-05-21 DIAGNOSIS — Z6841 Body Mass Index (BMI) 40.0 and over, adult: Secondary | ICD-10-CM | POA: Diagnosis not present

## 2019-05-21 DIAGNOSIS — F410 Panic disorder [episodic paroxysmal anxiety] without agoraphobia: Secondary | ICD-10-CM | POA: Diagnosis not present

## 2019-05-21 DIAGNOSIS — J4520 Mild intermittent asthma, uncomplicated: Secondary | ICD-10-CM | POA: Diagnosis not present

## 2019-05-21 DIAGNOSIS — F330 Major depressive disorder, recurrent, mild: Secondary | ICD-10-CM | POA: Diagnosis not present

## 2019-05-21 DIAGNOSIS — F17218 Nicotine dependence, cigarettes, with other nicotine-induced disorders: Secondary | ICD-10-CM | POA: Diagnosis not present

## 2019-05-22 DIAGNOSIS — G4710 Hypersomnia, unspecified: Secondary | ICD-10-CM | POA: Diagnosis not present

## 2019-05-23 DIAGNOSIS — G4719 Other hypersomnia: Secondary | ICD-10-CM | POA: Diagnosis not present

## 2019-05-23 DIAGNOSIS — I10 Essential (primary) hypertension: Secondary | ICD-10-CM | POA: Diagnosis not present

## 2019-05-23 DIAGNOSIS — R0683 Snoring: Secondary | ICD-10-CM | POA: Diagnosis not present

## 2019-05-23 DIAGNOSIS — F17210 Nicotine dependence, cigarettes, uncomplicated: Secondary | ICD-10-CM | POA: Diagnosis not present

## 2019-05-23 DIAGNOSIS — G4710 Hypersomnia, unspecified: Secondary | ICD-10-CM | POA: Diagnosis not present

## 2019-06-13 DIAGNOSIS — F514 Sleep terrors [night terrors]: Secondary | ICD-10-CM | POA: Diagnosis not present

## 2019-06-13 DIAGNOSIS — F4310 Post-traumatic stress disorder, unspecified: Secondary | ICD-10-CM | POA: Diagnosis not present

## 2019-06-13 DIAGNOSIS — F5104 Psychophysiologic insomnia: Secondary | ICD-10-CM | POA: Diagnosis not present

## 2019-06-13 DIAGNOSIS — Z7189 Other specified counseling: Secondary | ICD-10-CM | POA: Diagnosis not present

## 2019-06-13 DIAGNOSIS — F411 Generalized anxiety disorder: Secondary | ICD-10-CM | POA: Diagnosis not present

## 2019-06-18 DIAGNOSIS — M5441 Lumbago with sciatica, right side: Secondary | ICD-10-CM | POA: Diagnosis not present

## 2019-06-23 DIAGNOSIS — M5441 Lumbago with sciatica, right side: Secondary | ICD-10-CM | POA: Diagnosis not present

## 2019-06-27 DIAGNOSIS — F5104 Psychophysiologic insomnia: Secondary | ICD-10-CM | POA: Diagnosis not present

## 2019-06-27 DIAGNOSIS — F4310 Post-traumatic stress disorder, unspecified: Secondary | ICD-10-CM | POA: Diagnosis not present

## 2019-06-27 DIAGNOSIS — F411 Generalized anxiety disorder: Secondary | ICD-10-CM | POA: Diagnosis not present

## 2019-06-27 DIAGNOSIS — M62838 Other muscle spasm: Secondary | ICD-10-CM | POA: Diagnosis not present

## 2019-07-04 DIAGNOSIS — Z713 Dietary counseling and surveillance: Secondary | ICD-10-CM | POA: Diagnosis not present

## 2019-07-11 DIAGNOSIS — F411 Generalized anxiety disorder: Secondary | ICD-10-CM | POA: Diagnosis not present

## 2019-07-11 DIAGNOSIS — J4520 Mild intermittent asthma, uncomplicated: Secondary | ICD-10-CM | POA: Diagnosis not present

## 2019-07-11 DIAGNOSIS — I10 Essential (primary) hypertension: Secondary | ICD-10-CM | POA: Diagnosis not present

## 2019-08-10 DIAGNOSIS — Z01818 Encounter for other preprocedural examination: Secondary | ICD-10-CM | POA: Diagnosis not present

## 2019-08-12 DIAGNOSIS — G4733 Obstructive sleep apnea (adult) (pediatric): Secondary | ICD-10-CM | POA: Diagnosis not present

## 2019-08-13 DIAGNOSIS — G4733 Obstructive sleep apnea (adult) (pediatric): Secondary | ICD-10-CM | POA: Diagnosis not present

## 2019-08-13 DIAGNOSIS — F4310 Post-traumatic stress disorder, unspecified: Secondary | ICD-10-CM | POA: Diagnosis not present

## 2019-08-13 DIAGNOSIS — Z6841 Body Mass Index (BMI) 40.0 and over, adult: Secondary | ICD-10-CM | POA: Diagnosis not present

## 2019-08-13 DIAGNOSIS — J4520 Mild intermittent asthma, uncomplicated: Secondary | ICD-10-CM | POA: Diagnosis not present

## 2019-08-13 DIAGNOSIS — F5104 Psychophysiologic insomnia: Secondary | ICD-10-CM | POA: Diagnosis not present

## 2019-08-13 DIAGNOSIS — F329 Major depressive disorder, single episode, unspecified: Secondary | ICD-10-CM | POA: Diagnosis not present

## 2019-08-13 DIAGNOSIS — E782 Mixed hyperlipidemia: Secondary | ICD-10-CM | POA: Diagnosis not present

## 2019-08-13 DIAGNOSIS — F411 Generalized anxiety disorder: Secondary | ICD-10-CM | POA: Diagnosis not present

## 2019-08-13 DIAGNOSIS — R600 Localized edema: Secondary | ICD-10-CM | POA: Diagnosis not present

## 2019-08-13 DIAGNOSIS — F410 Panic disorder [episodic paroxysmal anxiety] without agoraphobia: Secondary | ICD-10-CM | POA: Diagnosis not present

## 2019-08-13 DIAGNOSIS — Z01818 Encounter for other preprocedural examination: Secondary | ICD-10-CM | POA: Diagnosis not present

## 2019-08-13 DIAGNOSIS — M1990 Unspecified osteoarthritis, unspecified site: Secondary | ICD-10-CM | POA: Diagnosis not present

## 2019-08-13 DIAGNOSIS — F514 Sleep terrors [night terrors]: Secondary | ICD-10-CM | POA: Diagnosis not present

## 2019-08-13 DIAGNOSIS — I10 Essential (primary) hypertension: Secondary | ICD-10-CM | POA: Diagnosis not present

## 2019-08-13 DIAGNOSIS — K219 Gastro-esophageal reflux disease without esophagitis: Secondary | ICD-10-CM | POA: Diagnosis not present

## 2019-08-14 DIAGNOSIS — J4520 Mild intermittent asthma, uncomplicated: Secondary | ICD-10-CM | POA: Diagnosis not present

## 2019-08-14 DIAGNOSIS — G4710 Hypersomnia, unspecified: Secondary | ICD-10-CM | POA: Diagnosis not present

## 2019-08-14 DIAGNOSIS — F17210 Nicotine dependence, cigarettes, uncomplicated: Secondary | ICD-10-CM | POA: Diagnosis not present

## 2019-08-14 DIAGNOSIS — G4733 Obstructive sleep apnea (adult) (pediatric): Secondary | ICD-10-CM | POA: Diagnosis not present

## 2019-08-14 DIAGNOSIS — I10 Essential (primary) hypertension: Secondary | ICD-10-CM | POA: Diagnosis not present

## 2019-08-19 DIAGNOSIS — G4733 Obstructive sleep apnea (adult) (pediatric): Secondary | ICD-10-CM | POA: Diagnosis not present

## 2019-08-26 DIAGNOSIS — Z20828 Contact with and (suspected) exposure to other viral communicable diseases: Secondary | ICD-10-CM | POA: Diagnosis not present

## 2019-09-04 DIAGNOSIS — I10 Essential (primary) hypertension: Secondary | ICD-10-CM | POA: Diagnosis not present

## 2019-09-04 DIAGNOSIS — Z87891 Personal history of nicotine dependence: Secondary | ICD-10-CM | POA: Diagnosis not present

## 2019-09-04 DIAGNOSIS — Z885 Allergy status to narcotic agent status: Secondary | ICD-10-CM | POA: Diagnosis not present

## 2019-09-04 DIAGNOSIS — M542 Cervicalgia: Secondary | ICD-10-CM | POA: Diagnosis not present

## 2019-09-04 DIAGNOSIS — H538 Other visual disturbances: Secondary | ICD-10-CM | POA: Diagnosis not present

## 2019-09-04 DIAGNOSIS — G43909 Migraine, unspecified, not intractable, without status migrainosus: Secondary | ICD-10-CM | POA: Diagnosis not present

## 2019-09-04 DIAGNOSIS — G9001 Carotid sinus syncope: Secondary | ICD-10-CM | POA: Diagnosis not present

## 2019-09-04 DIAGNOSIS — Z888 Allergy status to other drugs, medicaments and biological substances status: Secondary | ICD-10-CM | POA: Diagnosis not present

## 2019-09-04 DIAGNOSIS — E785 Hyperlipidemia, unspecified: Secondary | ICD-10-CM | POA: Diagnosis not present

## 2019-09-04 DIAGNOSIS — Z79899 Other long term (current) drug therapy: Secondary | ICD-10-CM | POA: Diagnosis not present

## 2019-09-04 DIAGNOSIS — K219 Gastro-esophageal reflux disease without esophagitis: Secondary | ICD-10-CM | POA: Diagnosis not present

## 2019-09-04 DIAGNOSIS — R519 Headache, unspecified: Secondary | ICD-10-CM | POA: Diagnosis not present

## 2019-09-19 DIAGNOSIS — F410 Panic disorder [episodic paroxysmal anxiety] without agoraphobia: Secondary | ICD-10-CM | POA: Diagnosis not present

## 2019-09-19 DIAGNOSIS — Z713 Dietary counseling and surveillance: Secondary | ICD-10-CM | POA: Diagnosis not present

## 2019-09-19 DIAGNOSIS — F411 Generalized anxiety disorder: Secondary | ICD-10-CM | POA: Diagnosis not present

## 2019-09-19 DIAGNOSIS — G4733 Obstructive sleep apnea (adult) (pediatric): Secondary | ICD-10-CM | POA: Diagnosis not present

## 2019-09-26 DIAGNOSIS — G4719 Other hypersomnia: Secondary | ICD-10-CM | POA: Diagnosis not present

## 2019-09-26 DIAGNOSIS — R0600 Dyspnea, unspecified: Secondary | ICD-10-CM | POA: Diagnosis not present

## 2019-09-26 DIAGNOSIS — R0683 Snoring: Secondary | ICD-10-CM | POA: Diagnosis not present

## 2019-09-26 DIAGNOSIS — G4733 Obstructive sleep apnea (adult) (pediatric): Secondary | ICD-10-CM | POA: Diagnosis not present

## 2019-09-26 DIAGNOSIS — I10 Essential (primary) hypertension: Secondary | ICD-10-CM | POA: Diagnosis not present

## 2019-09-30 DIAGNOSIS — F410 Panic disorder [episodic paroxysmal anxiety] without agoraphobia: Secondary | ICD-10-CM | POA: Diagnosis not present

## 2019-09-30 DIAGNOSIS — F17218 Nicotine dependence, cigarettes, with other nicotine-induced disorders: Secondary | ICD-10-CM | POA: Diagnosis not present

## 2019-09-30 DIAGNOSIS — F411 Generalized anxiety disorder: Secondary | ICD-10-CM | POA: Diagnosis not present

## 2019-10-10 DIAGNOSIS — G4733 Obstructive sleep apnea (adult) (pediatric): Secondary | ICD-10-CM | POA: Diagnosis not present

## 2019-10-10 DIAGNOSIS — Z6841 Body Mass Index (BMI) 40.0 and over, adult: Secondary | ICD-10-CM | POA: Diagnosis not present

## 2019-10-10 DIAGNOSIS — F411 Generalized anxiety disorder: Secondary | ICD-10-CM | POA: Diagnosis not present

## 2019-10-10 DIAGNOSIS — Z87891 Personal history of nicotine dependence: Secondary | ICD-10-CM | POA: Diagnosis not present

## 2019-10-10 DIAGNOSIS — Z01810 Encounter for preprocedural cardiovascular examination: Secondary | ICD-10-CM | POA: Diagnosis not present

## 2019-10-10 DIAGNOSIS — E782 Mixed hyperlipidemia: Secondary | ICD-10-CM | POA: Diagnosis not present

## 2019-10-10 DIAGNOSIS — F410 Panic disorder [episodic paroxysmal anxiety] without agoraphobia: Secondary | ICD-10-CM | POA: Diagnosis not present

## 2019-10-10 DIAGNOSIS — I10 Essential (primary) hypertension: Secondary | ICD-10-CM | POA: Diagnosis not present

## 2019-10-13 DIAGNOSIS — G4733 Obstructive sleep apnea (adult) (pediatric): Secondary | ICD-10-CM | POA: Diagnosis not present

## 2019-10-13 DIAGNOSIS — Z6841 Body Mass Index (BMI) 40.0 and over, adult: Secondary | ICD-10-CM | POA: Diagnosis not present

## 2019-10-13 DIAGNOSIS — Z87891 Personal history of nicotine dependence: Secondary | ICD-10-CM | POA: Diagnosis not present

## 2019-10-19 DIAGNOSIS — G4733 Obstructive sleep apnea (adult) (pediatric): Secondary | ICD-10-CM | POA: Diagnosis not present

## 2019-10-24 DIAGNOSIS — Z87891 Personal history of nicotine dependence: Secondary | ICD-10-CM | POA: Diagnosis not present

## 2019-10-24 DIAGNOSIS — I10 Essential (primary) hypertension: Secondary | ICD-10-CM | POA: Diagnosis not present

## 2019-10-24 DIAGNOSIS — Z6841 Body Mass Index (BMI) 40.0 and over, adult: Secondary | ICD-10-CM | POA: Diagnosis not present

## 2019-11-11 DIAGNOSIS — Z6841 Body Mass Index (BMI) 40.0 and over, adult: Secondary | ICD-10-CM | POA: Diagnosis not present

## 2019-11-11 DIAGNOSIS — E282 Polycystic ovarian syndrome: Secondary | ICD-10-CM | POA: Diagnosis not present

## 2019-11-20 DIAGNOSIS — G4733 Obstructive sleep apnea (adult) (pediatric): Secondary | ICD-10-CM | POA: Diagnosis not present

## 2019-11-20 DIAGNOSIS — F4310 Post-traumatic stress disorder, unspecified: Secondary | ICD-10-CM | POA: Diagnosis not present

## 2019-11-20 DIAGNOSIS — J4520 Mild intermittent asthma, uncomplicated: Secondary | ICD-10-CM | POA: Diagnosis not present

## 2019-11-20 DIAGNOSIS — Z01818 Encounter for other preprocedural examination: Secondary | ICD-10-CM | POA: Diagnosis not present

## 2019-11-20 DIAGNOSIS — I10 Essential (primary) hypertension: Secondary | ICD-10-CM | POA: Diagnosis not present

## 2019-11-20 DIAGNOSIS — E559 Vitamin D deficiency, unspecified: Secondary | ICD-10-CM | POA: Diagnosis not present

## 2019-11-21 DIAGNOSIS — E8881 Metabolic syndrome: Secondary | ICD-10-CM | POA: Diagnosis not present

## 2019-11-21 DIAGNOSIS — F4310 Post-traumatic stress disorder, unspecified: Secondary | ICD-10-CM | POA: Diagnosis not present

## 2019-11-21 DIAGNOSIS — J4520 Mild intermittent asthma, uncomplicated: Secondary | ICD-10-CM | POA: Diagnosis not present

## 2019-11-21 DIAGNOSIS — E559 Vitamin D deficiency, unspecified: Secondary | ICD-10-CM | POA: Diagnosis not present

## 2019-11-21 DIAGNOSIS — F411 Generalized anxiety disorder: Secondary | ICD-10-CM | POA: Diagnosis not present

## 2019-11-21 DIAGNOSIS — Z79899 Other long term (current) drug therapy: Secondary | ICD-10-CM | POA: Diagnosis not present

## 2019-11-21 DIAGNOSIS — Z20822 Contact with and (suspected) exposure to covid-19: Secondary | ICD-10-CM | POA: Diagnosis not present

## 2019-11-21 DIAGNOSIS — Z6841 Body Mass Index (BMI) 40.0 and over, adult: Secondary | ICD-10-CM | POA: Diagnosis not present

## 2019-11-21 DIAGNOSIS — F514 Sleep terrors [night terrors]: Secondary | ICD-10-CM | POA: Diagnosis not present

## 2019-11-21 DIAGNOSIS — G4733 Obstructive sleep apnea (adult) (pediatric): Secondary | ICD-10-CM | POA: Diagnosis not present

## 2019-11-21 DIAGNOSIS — Z87891 Personal history of nicotine dependence: Secondary | ICD-10-CM | POA: Diagnosis not present

## 2019-11-21 DIAGNOSIS — I10 Essential (primary) hypertension: Secondary | ICD-10-CM | POA: Diagnosis not present

## 2019-11-21 DIAGNOSIS — Z885 Allergy status to narcotic agent status: Secondary | ICD-10-CM | POA: Diagnosis not present

## 2019-11-21 DIAGNOSIS — F410 Panic disorder [episodic paroxysmal anxiety] without agoraphobia: Secondary | ICD-10-CM | POA: Diagnosis not present

## 2019-11-21 DIAGNOSIS — E782 Mixed hyperlipidemia: Secondary | ICD-10-CM | POA: Diagnosis not present

## 2019-11-27 DIAGNOSIS — Z888 Allergy status to other drugs, medicaments and biological substances status: Secondary | ICD-10-CM | POA: Diagnosis not present

## 2019-11-27 DIAGNOSIS — G4730 Sleep apnea, unspecified: Secondary | ICD-10-CM | POA: Diagnosis not present

## 2019-11-27 DIAGNOSIS — Z9884 Bariatric surgery status: Secondary | ICD-10-CM | POA: Diagnosis not present

## 2019-11-27 DIAGNOSIS — Z885 Allergy status to narcotic agent status: Secondary | ICD-10-CM | POA: Diagnosis not present

## 2019-11-27 DIAGNOSIS — E785 Hyperlipidemia, unspecified: Secondary | ICD-10-CM | POA: Diagnosis not present

## 2019-11-27 DIAGNOSIS — F419 Anxiety disorder, unspecified: Secondary | ICD-10-CM | POA: Diagnosis not present

## 2019-11-27 DIAGNOSIS — M1990 Unspecified osteoarthritis, unspecified site: Secondary | ICD-10-CM | POA: Diagnosis not present

## 2019-11-27 DIAGNOSIS — K219 Gastro-esophageal reflux disease without esophagitis: Secondary | ICD-10-CM | POA: Diagnosis not present

## 2019-11-27 DIAGNOSIS — I10 Essential (primary) hypertension: Secondary | ICD-10-CM | POA: Diagnosis not present

## 2019-11-27 DIAGNOSIS — J45909 Unspecified asthma, uncomplicated: Secondary | ICD-10-CM | POA: Diagnosis not present

## 2019-11-27 DIAGNOSIS — R112 Nausea with vomiting, unspecified: Secondary | ICD-10-CM | POA: Diagnosis not present

## 2019-11-27 DIAGNOSIS — Z6841 Body Mass Index (BMI) 40.0 and over, adult: Secondary | ICD-10-CM | POA: Diagnosis not present

## 2019-11-27 DIAGNOSIS — K810 Acute cholecystitis: Secondary | ICD-10-CM | POA: Diagnosis not present

## 2019-11-27 DIAGNOSIS — R1110 Vomiting, unspecified: Secondary | ICD-10-CM | POA: Diagnosis not present

## 2019-11-27 DIAGNOSIS — R7989 Other specified abnormal findings of blood chemistry: Secondary | ICD-10-CM | POA: Diagnosis not present

## 2019-11-27 DIAGNOSIS — Z79899 Other long term (current) drug therapy: Secondary | ICD-10-CM | POA: Diagnosis not present

## 2019-11-27 DIAGNOSIS — K828 Other specified diseases of gallbladder: Secondary | ICD-10-CM | POA: Diagnosis not present

## 2019-11-27 DIAGNOSIS — Z87891 Personal history of nicotine dependence: Secondary | ICD-10-CM | POA: Diagnosis not present

## 2019-11-28 DIAGNOSIS — R7989 Other specified abnormal findings of blood chemistry: Secondary | ICD-10-CM | POA: Diagnosis not present

## 2019-11-28 DIAGNOSIS — K828 Other specified diseases of gallbladder: Secondary | ICD-10-CM | POA: Diagnosis not present

## 2019-11-28 DIAGNOSIS — R109 Unspecified abdominal pain: Secondary | ICD-10-CM | POA: Diagnosis not present

## 2019-11-28 DIAGNOSIS — R112 Nausea with vomiting, unspecified: Secondary | ICD-10-CM | POA: Diagnosis not present

## 2019-11-28 DIAGNOSIS — R1110 Vomiting, unspecified: Secondary | ICD-10-CM | POA: Diagnosis not present

## 2019-12-18 DIAGNOSIS — G4733 Obstructive sleep apnea (adult) (pediatric): Secondary | ICD-10-CM | POA: Diagnosis not present

## 2019-12-19 DIAGNOSIS — Z9889 Other specified postprocedural states: Secondary | ICD-10-CM | POA: Diagnosis not present

## 2019-12-19 DIAGNOSIS — I10 Essential (primary) hypertension: Secondary | ICD-10-CM | POA: Diagnosis not present

## 2019-12-19 DIAGNOSIS — R112 Nausea with vomiting, unspecified: Secondary | ICD-10-CM | POA: Diagnosis not present

## 2019-12-19 DIAGNOSIS — R300 Dysuria: Secondary | ICD-10-CM | POA: Diagnosis not present

## 2019-12-19 DIAGNOSIS — N898 Other specified noninflammatory disorders of vagina: Secondary | ICD-10-CM | POA: Diagnosis not present

## 2019-12-19 DIAGNOSIS — Z713 Dietary counseling and surveillance: Secondary | ICD-10-CM | POA: Diagnosis not present

## 2019-12-19 DIAGNOSIS — Z9884 Bariatric surgery status: Secondary | ICD-10-CM | POA: Diagnosis not present

## 2019-12-29 DIAGNOSIS — Z9889 Other specified postprocedural states: Secondary | ICD-10-CM | POA: Diagnosis not present

## 2019-12-29 DIAGNOSIS — R112 Nausea with vomiting, unspecified: Secondary | ICD-10-CM | POA: Diagnosis not present

## 2019-12-31 DIAGNOSIS — Z87891 Personal history of nicotine dependence: Secondary | ICD-10-CM | POA: Diagnosis not present

## 2019-12-31 DIAGNOSIS — K219 Gastro-esophageal reflux disease without esophagitis: Secondary | ICD-10-CM | POA: Diagnosis not present

## 2019-12-31 DIAGNOSIS — E785 Hyperlipidemia, unspecified: Secondary | ICD-10-CM | POA: Diagnosis not present

## 2019-12-31 DIAGNOSIS — M1990 Unspecified osteoarthritis, unspecified site: Secondary | ICD-10-CM | POA: Diagnosis not present

## 2019-12-31 DIAGNOSIS — R110 Nausea: Secondary | ICD-10-CM | POA: Diagnosis not present

## 2019-12-31 DIAGNOSIS — Z79899 Other long term (current) drug therapy: Secondary | ICD-10-CM | POA: Diagnosis not present

## 2019-12-31 DIAGNOSIS — Z885 Allergy status to narcotic agent status: Secondary | ICD-10-CM | POA: Diagnosis not present

## 2019-12-31 DIAGNOSIS — R197 Diarrhea, unspecified: Secondary | ICD-10-CM | POA: Diagnosis not present

## 2019-12-31 DIAGNOSIS — F329 Major depressive disorder, single episode, unspecified: Secondary | ICD-10-CM | POA: Diagnosis not present

## 2019-12-31 DIAGNOSIS — Z887 Allergy status to serum and vaccine status: Secondary | ICD-10-CM | POA: Diagnosis not present

## 2019-12-31 DIAGNOSIS — F419 Anxiety disorder, unspecified: Secondary | ICD-10-CM | POA: Diagnosis not present

## 2019-12-31 DIAGNOSIS — J45909 Unspecified asthma, uncomplicated: Secondary | ICD-10-CM | POA: Diagnosis not present

## 2020-01-04 DIAGNOSIS — R112 Nausea with vomiting, unspecified: Secondary | ICD-10-CM | POA: Diagnosis not present

## 2020-01-04 DIAGNOSIS — Z9889 Other specified postprocedural states: Secondary | ICD-10-CM | POA: Diagnosis not present

## 2020-01-09 DIAGNOSIS — E876 Hypokalemia: Secondary | ICD-10-CM | POA: Diagnosis not present

## 2020-01-09 DIAGNOSIS — Z9884 Bariatric surgery status: Secondary | ICD-10-CM | POA: Diagnosis not present

## 2020-01-14 DIAGNOSIS — Z885 Allergy status to narcotic agent status: Secondary | ICD-10-CM | POA: Diagnosis not present

## 2020-01-14 DIAGNOSIS — K760 Fatty (change of) liver, not elsewhere classified: Secondary | ICD-10-CM | POA: Diagnosis not present

## 2020-01-14 DIAGNOSIS — Z887 Allergy status to serum and vaccine status: Secondary | ICD-10-CM | POA: Diagnosis not present

## 2020-01-14 DIAGNOSIS — Z87891 Personal history of nicotine dependence: Secondary | ICD-10-CM | POA: Diagnosis not present

## 2020-01-14 DIAGNOSIS — J45909 Unspecified asthma, uncomplicated: Secondary | ICD-10-CM | POA: Diagnosis not present

## 2020-01-14 DIAGNOSIS — E785 Hyperlipidemia, unspecified: Secondary | ICD-10-CM | POA: Diagnosis not present

## 2020-01-14 DIAGNOSIS — M1990 Unspecified osteoarthritis, unspecified site: Secondary | ICD-10-CM | POA: Diagnosis not present

## 2020-01-14 DIAGNOSIS — Z79899 Other long term (current) drug therapy: Secondary | ICD-10-CM | POA: Diagnosis not present

## 2020-01-14 DIAGNOSIS — R1032 Left lower quadrant pain: Secondary | ICD-10-CM | POA: Diagnosis not present

## 2020-01-14 DIAGNOSIS — I10 Essential (primary) hypertension: Secondary | ICD-10-CM | POA: Diagnosis not present

## 2020-01-14 DIAGNOSIS — F419 Anxiety disorder, unspecified: Secondary | ICD-10-CM | POA: Diagnosis not present

## 2020-01-14 DIAGNOSIS — K5730 Diverticulosis of large intestine without perforation or abscess without bleeding: Secondary | ICD-10-CM | POA: Diagnosis not present

## 2020-01-14 DIAGNOSIS — F329 Major depressive disorder, single episode, unspecified: Secondary | ICD-10-CM | POA: Diagnosis not present

## 2020-01-14 DIAGNOSIS — K429 Umbilical hernia without obstruction or gangrene: Secondary | ICD-10-CM | POA: Diagnosis not present

## 2020-01-14 DIAGNOSIS — R112 Nausea with vomiting, unspecified: Secondary | ICD-10-CM | POA: Diagnosis not present

## 2020-01-14 DIAGNOSIS — K828 Other specified diseases of gallbladder: Secondary | ICD-10-CM | POA: Diagnosis not present

## 2020-01-16 DIAGNOSIS — R1032 Left lower quadrant pain: Secondary | ICD-10-CM | POA: Diagnosis not present

## 2020-01-16 DIAGNOSIS — K828 Other specified diseases of gallbladder: Secondary | ICD-10-CM | POA: Diagnosis not present

## 2020-01-17 DIAGNOSIS — G4733 Obstructive sleep apnea (adult) (pediatric): Secondary | ICD-10-CM | POA: Diagnosis not present

## 2020-01-30 DIAGNOSIS — R4586 Emotional lability: Secondary | ICD-10-CM | POA: Diagnosis not present

## 2020-01-30 DIAGNOSIS — F5104 Psychophysiologic insomnia: Secondary | ICD-10-CM | POA: Diagnosis not present

## 2020-01-30 DIAGNOSIS — F411 Generalized anxiety disorder: Secondary | ICD-10-CM | POA: Diagnosis not present

## 2020-01-30 DIAGNOSIS — F4310 Post-traumatic stress disorder, unspecified: Secondary | ICD-10-CM | POA: Diagnosis not present

## 2020-02-02 DIAGNOSIS — E876 Hypokalemia: Secondary | ICD-10-CM | POA: Diagnosis not present

## 2020-02-02 DIAGNOSIS — R42 Dizziness and giddiness: Secondary | ICD-10-CM | POA: Diagnosis not present

## 2020-02-02 DIAGNOSIS — I10 Essential (primary) hypertension: Secondary | ICD-10-CM | POA: Diagnosis not present

## 2020-02-02 DIAGNOSIS — Z87891 Personal history of nicotine dependence: Secondary | ICD-10-CM | POA: Diagnosis not present

## 2020-02-02 DIAGNOSIS — F329 Major depressive disorder, single episode, unspecified: Secondary | ICD-10-CM | POA: Diagnosis not present

## 2020-02-02 DIAGNOSIS — E785 Hyperlipidemia, unspecified: Secondary | ICD-10-CM | POA: Diagnosis not present

## 2020-02-02 DIAGNOSIS — R519 Headache, unspecified: Secondary | ICD-10-CM | POA: Diagnosis not present

## 2020-02-02 DIAGNOSIS — Z975 Presence of (intrauterine) contraceptive device: Secondary | ICD-10-CM | POA: Diagnosis not present

## 2020-02-02 DIAGNOSIS — F419 Anxiety disorder, unspecified: Secondary | ICD-10-CM | POA: Diagnosis not present

## 2020-02-02 DIAGNOSIS — E860 Dehydration: Secondary | ICD-10-CM | POA: Diagnosis not present

## 2020-02-02 DIAGNOSIS — Z79899 Other long term (current) drug therapy: Secondary | ICD-10-CM | POA: Diagnosis not present

## 2020-02-02 DIAGNOSIS — Z887 Allergy status to serum and vaccine status: Secondary | ICD-10-CM | POA: Diagnosis not present

## 2020-02-02 DIAGNOSIS — Z886 Allergy status to analgesic agent status: Secondary | ICD-10-CM | POA: Diagnosis not present

## 2020-02-02 DIAGNOSIS — R110 Nausea: Secondary | ICD-10-CM | POA: Diagnosis not present

## 2020-02-02 DIAGNOSIS — K219 Gastro-esophageal reflux disease without esophagitis: Secondary | ICD-10-CM | POA: Diagnosis not present

## 2020-02-02 DIAGNOSIS — J45909 Unspecified asthma, uncomplicated: Secondary | ICD-10-CM | POA: Diagnosis not present

## 2020-02-07 DIAGNOSIS — Z887 Allergy status to serum and vaccine status: Secondary | ICD-10-CM | POA: Diagnosis not present

## 2020-02-07 DIAGNOSIS — R110 Nausea: Secondary | ICD-10-CM | POA: Diagnosis not present

## 2020-02-07 DIAGNOSIS — I10 Essential (primary) hypertension: Secondary | ICD-10-CM | POA: Diagnosis not present

## 2020-02-07 DIAGNOSIS — Z87891 Personal history of nicotine dependence: Secondary | ICD-10-CM | POA: Diagnosis not present

## 2020-02-07 DIAGNOSIS — R4182 Altered mental status, unspecified: Secondary | ICD-10-CM | POA: Diagnosis not present

## 2020-02-07 DIAGNOSIS — E785 Hyperlipidemia, unspecified: Secondary | ICD-10-CM | POA: Diagnosis not present

## 2020-02-07 DIAGNOSIS — Z886 Allergy status to analgesic agent status: Secondary | ICD-10-CM | POA: Diagnosis not present

## 2020-02-07 DIAGNOSIS — K219 Gastro-esophageal reflux disease without esophagitis: Secondary | ICD-10-CM | POA: Diagnosis not present

## 2020-02-07 DIAGNOSIS — Z793 Long term (current) use of hormonal contraceptives: Secondary | ICD-10-CM | POA: Diagnosis not present

## 2020-02-07 DIAGNOSIS — R404 Transient alteration of awareness: Secondary | ICD-10-CM | POA: Diagnosis not present

## 2020-02-07 DIAGNOSIS — Z9884 Bariatric surgery status: Secondary | ICD-10-CM | POA: Diagnosis not present

## 2020-02-07 DIAGNOSIS — Z79899 Other long term (current) drug therapy: Secondary | ICD-10-CM | POA: Diagnosis not present

## 2020-02-07 DIAGNOSIS — F419 Anxiety disorder, unspecified: Secondary | ICD-10-CM | POA: Diagnosis not present

## 2020-02-07 DIAGNOSIS — R5383 Other fatigue: Secondary | ICD-10-CM | POA: Diagnosis not present

## 2020-02-07 DIAGNOSIS — J45909 Unspecified asthma, uncomplicated: Secondary | ICD-10-CM | POA: Diagnosis not present

## 2020-02-07 DIAGNOSIS — F329 Major depressive disorder, single episode, unspecified: Secondary | ICD-10-CM | POA: Diagnosis not present

## 2020-02-17 DIAGNOSIS — G4733 Obstructive sleep apnea (adult) (pediatric): Secondary | ICD-10-CM | POA: Diagnosis not present

## 2020-02-24 DIAGNOSIS — Z79899 Other long term (current) drug therapy: Secondary | ICD-10-CM | POA: Diagnosis not present

## 2020-02-24 DIAGNOSIS — F17210 Nicotine dependence, cigarettes, uncomplicated: Secondary | ICD-10-CM | POA: Diagnosis not present

## 2020-02-24 DIAGNOSIS — J45909 Unspecified asthma, uncomplicated: Secondary | ICD-10-CM | POA: Diagnosis not present

## 2020-02-24 DIAGNOSIS — R1084 Generalized abdominal pain: Secondary | ICD-10-CM | POA: Diagnosis not present

## 2020-02-24 DIAGNOSIS — Z886 Allergy status to analgesic agent status: Secondary | ICD-10-CM | POA: Diagnosis not present

## 2020-02-24 DIAGNOSIS — Z9884 Bariatric surgery status: Secondary | ICD-10-CM | POA: Diagnosis not present

## 2020-02-24 DIAGNOSIS — Z975 Presence of (intrauterine) contraceptive device: Secondary | ICD-10-CM | POA: Diagnosis not present

## 2020-02-24 DIAGNOSIS — E860 Dehydration: Secondary | ICD-10-CM | POA: Diagnosis not present

## 2020-02-24 DIAGNOSIS — R112 Nausea with vomiting, unspecified: Secondary | ICD-10-CM | POA: Diagnosis not present

## 2020-02-24 DIAGNOSIS — K219 Gastro-esophageal reflux disease without esophagitis: Secondary | ICD-10-CM | POA: Diagnosis not present

## 2020-02-24 DIAGNOSIS — Z887 Allergy status to serum and vaccine status: Secondary | ICD-10-CM | POA: Diagnosis not present

## 2020-02-24 DIAGNOSIS — I10 Essential (primary) hypertension: Secondary | ICD-10-CM | POA: Diagnosis not present

## 2020-03-18 DIAGNOSIS — G4733 Obstructive sleep apnea (adult) (pediatric): Secondary | ICD-10-CM | POA: Diagnosis not present

## 2020-03-26 DIAGNOSIS — K912 Postsurgical malabsorption, not elsewhere classified: Secondary | ICD-10-CM | POA: Diagnosis not present

## 2020-03-26 DIAGNOSIS — Z9884 Bariatric surgery status: Secondary | ICD-10-CM | POA: Diagnosis not present

## 2020-04-09 DIAGNOSIS — D1801 Hemangioma of skin and subcutaneous tissue: Secondary | ICD-10-CM | POA: Diagnosis not present

## 2020-04-09 DIAGNOSIS — L989 Disorder of the skin and subcutaneous tissue, unspecified: Secondary | ICD-10-CM | POA: Diagnosis not present

## 2020-04-09 DIAGNOSIS — Q828 Other specified congenital malformations of skin: Secondary | ICD-10-CM | POA: Diagnosis not present

## 2020-04-18 DIAGNOSIS — G4733 Obstructive sleep apnea (adult) (pediatric): Secondary | ICD-10-CM | POA: Diagnosis not present

## 2020-04-20 DIAGNOSIS — Z9884 Bariatric surgery status: Secondary | ICD-10-CM | POA: Diagnosis not present

## 2020-04-21 DIAGNOSIS — J029 Acute pharyngitis, unspecified: Secondary | ICD-10-CM | POA: Diagnosis not present

## 2020-04-27 ENCOUNTER — Other Ambulatory Visit: Payer: Self-pay

## 2020-04-27 ENCOUNTER — Emergency Department (HOSPITAL_BASED_OUTPATIENT_CLINIC_OR_DEPARTMENT_OTHER)
Admission: EM | Admit: 2020-04-27 | Discharge: 2020-04-28 | Disposition: A | Discharge status: Home / Self Care | Payer: BC Managed Care – PPO | Attending: Emergency Medicine | Admitting: Emergency Medicine

## 2020-04-27 ENCOUNTER — Encounter (HOSPITAL_BASED_OUTPATIENT_CLINIC_OR_DEPARTMENT_OTHER): Payer: Self-pay | Admitting: Emergency Medicine

## 2020-04-27 ENCOUNTER — Emergency Department (HOSPITAL_BASED_OUTPATIENT_CLINIC_OR_DEPARTMENT_OTHER): Payer: BC Managed Care – PPO

## 2020-04-27 DIAGNOSIS — Z7951 Long term (current) use of inhaled steroids: Secondary | ICD-10-CM | POA: Insufficient documentation

## 2020-04-27 DIAGNOSIS — Z20822 Contact with and (suspected) exposure to covid-19: Secondary | ICD-10-CM | POA: Insufficient documentation

## 2020-04-27 DIAGNOSIS — Z87891 Personal history of nicotine dependence: Secondary | ICD-10-CM | POA: Diagnosis not present

## 2020-04-27 DIAGNOSIS — R0902 Hypoxemia: Secondary | ICD-10-CM | POA: Diagnosis not present

## 2020-04-27 DIAGNOSIS — Z79899 Other long term (current) drug therapy: Secondary | ICD-10-CM | POA: Diagnosis not present

## 2020-04-27 DIAGNOSIS — I10 Essential (primary) hypertension: Secondary | ICD-10-CM | POA: Insufficient documentation

## 2020-04-27 DIAGNOSIS — R0602 Shortness of breath: Secondary | ICD-10-CM | POA: Diagnosis not present

## 2020-04-27 DIAGNOSIS — E876 Hypokalemia: Secondary | ICD-10-CM | POA: Diagnosis present

## 2020-04-27 DIAGNOSIS — R062 Wheezing: Secondary | ICD-10-CM | POA: Diagnosis not present

## 2020-04-27 DIAGNOSIS — J4531 Mild persistent asthma with (acute) exacerbation: Secondary | ICD-10-CM | POA: Diagnosis not present

## 2020-04-27 HISTORY — DX: Insomnia, unspecified: G47.00

## 2020-04-27 HISTORY — DX: Sleep apnea, unspecified: G47.30

## 2020-04-27 HISTORY — DX: Sleep disorder, unspecified: G47.9

## 2020-04-27 HISTORY — DX: Postsurgical malabsorption, not elsewhere classified: K91.2

## 2020-04-27 HISTORY — DX: Post-traumatic stress disorder, unspecified: F43.10

## 2020-04-27 LAB — BASIC METABOLIC PANEL
Anion gap: 14 (ref 5–15)
BUN: 11 mg/dL (ref 6–20)
CO2: 19 mmol/L — ABNORMAL LOW (ref 22–32)
Calcium: 8.7 mg/dL — ABNORMAL LOW (ref 8.9–10.3)
Chloride: 105 mmol/L (ref 98–111)
Creatinine, Ser: 0.69 mg/dL (ref 0.44–1.00)
GFR calc Af Amer: >60 mL/min (ref >60)
GFR calc non Af Amer: >60 mL/min (ref >60)
Glucose, Bld: 120 mg/dL — ABNORMAL HIGH (ref 70–99)
Potassium: 2.4 mmol/L — CL (ref 3.5–5.1)
Sodium: 138 mmol/L (ref 135–145)

## 2020-04-27 LAB — CBC WITH DIFFERENTIAL/PLATELET
Abs Immature Granulocytes: 0.03 10*3/uL (ref 0.00–0.07)
Basophils Absolute: 0 10*3/uL (ref 0.0–0.1)
Basophils Relative: 0 %
Eosinophils Absolute: 0 10*3/uL (ref 0.0–0.5)
Eosinophils Relative: 0 %
HCT: 41.1 % (ref 36.0–46.0)
Hemoglobin: 14.6 g/dL (ref 12.0–15.0)
Immature Granulocytes: 0 %
Lymphocytes Relative: 10 %
Lymphs Abs: 0.9 10*3/uL (ref 0.7–4.0)
MCH: 31.5 pg (ref 26.0–34.0)
MCHC: 35.5 g/dL (ref 30.0–36.0)
MCV: 88.6 fL (ref 80.0–100.0)
Monocytes Absolute: 0.3 10*3/uL (ref 0.1–1.0)
Monocytes Relative: 4 %
Neutro Abs: 7.7 10*3/uL (ref 1.7–7.7)
Neutrophils Relative %: 86 %
Platelets: 163 10*3/uL (ref 150–400)
RBC: 4.64 MIL/uL (ref 3.87–5.11)
RDW: 12.8 % (ref 11.5–15.5)
WBC: 9 10*3/uL (ref 4.0–10.5)
nRBC: 0 % (ref 0.0–0.2)

## 2020-04-27 LAB — MAGNESIUM: Magnesium: 2 mg/dL (ref 1.7–2.4)

## 2020-04-27 LAB — SARS CORONAVIRUS 2 BY RT PCR (HOSPITAL ORDER, PERFORMED IN ~~LOC~~ HOSPITAL LAB): SARS Coronavirus 2: NEGATIVE

## 2020-04-27 MED ORDER — LORAZEPAM 2 MG/ML IJ SOLN: 1.0000 mg | Freq: Once | INTRAMUSCULAR | Status: AC

## 2020-04-27 MED ORDER — POTASSIUM CHLORIDE 10 MEQ/100ML IV SOLN
10.0000 meq | INTRAVENOUS | Status: AC
  Administered 2020-04-27 (×4): 10 meq via INTRAVENOUS
  Filled 2020-04-27 (×4): qty 100

## 2020-04-27 MED ORDER — LORAZEPAM 2 MG/ML IJ SOLN
INTRAMUSCULAR | Status: AC
  Administered 2020-04-27: 1 mg via INTRAVENOUS
  Filled 2020-04-27: qty 1

## 2020-04-27 MED ORDER — SODIUM CHLORIDE 0.9 % IV BOLUS
1000.0000 mL | Freq: Once | INTRAVENOUS | Status: AC
  Administered 2020-04-27: 1000 mL via INTRAVENOUS

## 2020-04-27 MED ORDER — DICYCLOMINE HCL 10 MG PO CAPS: 10.0000 mg | ORAL_CAPSULE | Freq: Once | ORAL | Status: DC

## 2020-04-27 MED ORDER — LORAZEPAM 2 MG/ML IJ SOLN
1.0000 mg | Freq: Once | INTRAMUSCULAR | Status: AC
  Administered 2020-04-27: 1 mg via INTRAVENOUS
  Filled 2020-04-27: qty 1

## 2020-04-27 MED ORDER — MAGNESIUM SULFATE 2 GM/50ML IV SOLN
2.0000 g | Freq: Once | INTRAVENOUS | Status: AC
  Administered 2020-04-27: 2 g via INTRAVENOUS
  Filled 2020-04-27: qty 50

## 2020-04-27 NOTE — ED Notes (Signed)
EKG Done by Brett Canales RT

## 2020-04-27 NOTE — ED Notes (Signed)
PA student at bedside.

## 2020-04-27 NOTE — ED Provider Notes (Addendum)
[facility] EMERGENCY DEPARTMENT Provider Note   CSN: [id number] Arrival date & time: 04/27/20  1527     History Chief Complaint  Patient presents with  . Shortness of Breath    [name] is a 29 y.o. female with past medical history significant for asthma, PCOS, recent gastric bypass surgery in April who presents for evaluation of shortness of breath.  Patient states yesterday she was mowing the grass and smoked one of her husband's cigarettes.  States this morning she felt short of breath.  Felt like she was wheezing.  She used her albuterol inhaler however this was over 58 years old and did not help.  She went to work her symptoms had worsened.  When EMS was called patient was noted to have expiratory wheezing.  She was given albuterol nebulizer and 125 Solu-Medrol with marked improvement.  Patient states she did have some chest tightness with shortness of breath.  Patient states she feels significantly improved.  She has noted green productive sputum.  No fever, chills, nausea, vomiting, chest pain, hemoptysis, neck pain, neck stiffness, congestion, rhinorrhea, abdominal pain, rashes, lesions, lower extremity edema, erythema or warmth.  No prior history of PE or DVT.  Denies additional aggravating or alleviating factors. Patient has been followed by PCP for hypokalemia 1 month ago was 4.5, lowest was 3.7 per patient.  Tested negative for Covid on 04/21/2020  History obtained from patient, husband in room and past medical records.  No interpreter was used.  HPI     Past Medical History:  Diagnosis Date  . Animal bite of finger    a cat  . Anxiety   . Asthma   . Bronchitis   . Endometriosis   . Insomnia   . PCOS (polycystic ovarian syndrome)   . Postgastrectomy malabsorption   . PTSD (post-traumatic stress disorder)   . Sleep apnea   . Sleep disorder     Patient Active Problem List   Diagnosis Date Noted  . Asthma exacerbation 04/27/2015  . Thrombocytopenia  (HCC)   . Shortness of breath   . Essential hypertension   . Asthma   . Hypokalemia   . Status post cesarean section 01/20/2014  . Pregnancy 01/18/2014  . Allergic rhinitis, seasonal 12/04/2013  . Tobacco use disorder complicating pregnancy, childbirth, or the puerperium, antepartum condition or complication 07/11/2013  . Rh negative state in antepartum period 06/13/2013  . Supervision of normal pregnancy in first trimester 06/11/2013  . Obesity 06/11/2013  . Smoker 06/11/2013    Past Surgical History:  Procedure Laterality Date  . CESAREAN SECTION N/A 01/20/2014   Procedure: CESAREAN SECTION;  Surgeon: Brock Bad, MD;  Location: [facility]S;  Service: Obstetrics;  Laterality: N/A;  . GASTRIC BYPASS    . MOUTH SURGERY    . WISDOM TOOTH EXTRACTION       OB History    Gravida  1   Para  1   Term  1   Preterm      AB      Living  1     SAB      TAB      Ectopic      Multiple      Live Births  1           Family History  Problem Relation Age of Onset  . Asthma Mother   . Diabetes Father   . Heart attack Maternal Grandfather   . Diabetes Paternal Grandmother   .  Heart attack Paternal Grandfather     Social History   Tobacco Use  . Smoking status: Former Smoker    Packs/day: 0.25    Types: Cigarettes    Quit date: 09/22/2019    Years since quitting: 0.5  . Smokeless tobacco: Never Used  Substance Use Topics  . Alcohol use: Not Currently  . Drug use: No    Home Medications Prior to Admission medications   Medication Sig Start Date End Date Taking? Authorizing Provider  acetaminophen (TYLENOL) 500 MG tablet Take 1,000 mg by mouth every 6 (six) hours as needed for mild pain, moderate pain or headache.    [provider]  albuterol (PROVENTIL HFA;VENTOLIN HFA) 108 (90 Base) MCG/ACT inhaler Inhale 1-2 puffs into the lungs every 6 (six) hours as needed for wheezing or shortness of breath.    [provider]  albuterol (PROVENTIL) (5  MG/ML) 0.5% nebulizer solution Take 0.5 mLs (2.5 mg total) by nebulization every 6 (six) hours as needed for wheezing or shortness of breath. 06/26/16   Deborha Payment, PA-C  etonogestrel-ethinyl estradiol (NUVARING) 0.12-0.015 MG/24HR vaginal ring Place 1 each vaginally every 28 (twenty-eight) days. Insert vaginally and leave in place for 3 consecutive weeks, then remove for 1 week.    [provider]  ibuprofen (ADVIL,MOTRIN) 800 MG tablet Take 1 tablet (800 mg total) by mouth 3 (three) times daily. 09/25/16   Elpidio Anis, PA-C  levalbuterol (XOPENEX HFA) 45 MCG/ACT inhaler Inhale 1-2 puffs into the lungs every 6 (six) hours as needed for wheezing or shortness of breath.    [provider]  levalbuterol (XOPENEX) 1.25 MG/0.5ML nebulizer solution Take 1.25 mg by nebulization every 6 (six) hours as needed for wheezing or shortness of breath.    [provider]  metroNIDAZOLE (METROGEL VAGINAL) 0.75 % vaginal gel Place 1 Applicatorful vaginally 2 (two) times daily. 09/25/16   Elpidio Anis, PA-C  oxyCODONE-acetaminophen (PERCOCET/ROXICET) 5-325 MG tablet Take 1 tablet by mouth every 4 (four) hours as needed for severe pain. 09/25/16   Elpidio Anis, PA-C  scopolamine (TRANSDERM-SCOP) 1 MG/3DAYS Place 1 patch (1.5 mg total) onto the skin every 3 (three) days. 08/29/18   Waldon Merl, PA-C  potassium chloride (K-DUR) 10 MEQ tablet Take 1 tablet (10 mEq total) by mouth daily. Patient not taking: Reported on 08/16/2015 04/27/15 08/16/15  Alison Murray, MD    Allergies    Tetanus-diphth-acell pertussis and Morphine and related  Review of Systems   Review of Systems  Constitutional: Negative.   HENT: Negative.   Respiratory: Positive for cough, chest tightness and shortness of breath. Negative for apnea, choking, wheezing and stridor.   Cardiovascular: Negative.   Gastrointestinal: Negative.   Genitourinary: Negative.   Musculoskeletal: Negative.   Skin: Negative.     Neurological: Negative.   All other systems reviewed and are negative.   Physical Exam Updated Vital Signs BP 132/86 (BP Location: Right Arm)   Pulse 93   Temp 98.9 F (37.2 C)   Resp 15   Ht  (1.753 m)   Wt 104.3 kg   SpO2 100%   BMI 33.97 kg/m   Physical Exam Vitals and nursing note reviewed.  Constitutional:      General: She is not in acute distress.    Appearance: She is well-developed. She is not ill-appearing, toxic-appearing or diaphoretic.  HENT:     Head: Normocephalic and atraumatic.     Mouth/Throat:     Mouth: Mucous membranes are  moist.  Eyes:     Pupils: Pupils are equal, round, and reactive to light.  Cardiovascular:     Rate and Rhythm: Normal rate.  Pulmonary:     Effort: Pulmonary effort is normal. No respiratory distress.     Breath sounds: Normal breath sounds.     Comments: Clear to auscultation bilateral without wheeze, rhonchi or rales.  Speaks in full sentences without difficulty.  No accessory muscle usage.  No acute respiratory distress. Chest:     Comments: Equal rise and fall to chest wall.  No contusions. Abdominal:     General: Bowel sounds are normal. There is no distension.     Palpations: Abdomen is soft.  Musculoskeletal:        General: Normal range of motion.     Cervical back: Normal range of motion.     Right lower leg: No tenderness. No edema.     Left lower leg: No tenderness. No edema.     Comments: No bony tenderness.  Compartments soft.  Denna HaggardHomans' sign negative.  Skin:    General: Skin is warm and dry.     Capillary Refill: Capillary refill takes less than 2 seconds.     Comments: No edema, erythema or warmth.  Neurological:     General: No focal deficit present.     Mental Status: She is alert.     Comments: Ambulatory without difficulty    ED Results / Procedures / Treatments   Labs (all labs ordered are listed, but only abnormal results are displayed) Labs Reviewed  BASIC METABOLIC PANEL - Abnormal; Notable  for the following components:      Result Value   Potassium 2.4 (*)    CO2 19 (*)    Glucose, Bld 120 (*)    Calcium 8.7 (*)    All other components within normal limits  SARS CORONAVIRUS 2 BY RT PCR [facility](HOSPITAL ORDER, PERFORMED IN [facility])  CBC WITH DIFFERENTIAL/PLATELET  MAGNESIUM  BASIC METABOLIC PANEL    EKG EKG Interpretation  Date/Time:  Tuesday April 27 2020 16:24:20 EDT Ventricular Rate:  81 PR Interval:  136 QRS Duration: 102 QT Interval:  548 QTC Calculation: 636 R Axis:   41 Text Interpretation: Normal sinus rhythm with sinus arrhythmia Nonspecific ST abnormality Prolonged QT Abnormal ECG Confirmed by Vanetta MuldersZackowski, Scott [phone]([id number]) on 04/27/2020 4:30:46 PM   Radiology DG Chest 2 View  Result Date: 04/27/2020 CLINICAL DATA:  Shortness of breath. EXAM: CHEST - 2 VIEW COMPARISON:  June 26, 2016. FINDINGS: The heart size and mediastinal contours are within normal limits. Both lungs are clear. No pneumothorax or pleural effusion is noted. The visualized skeletal structures are unremarkable. IMPRESSION: No active cardiopulmonary disease. Electronically Signed   By: Lupita RaiderJames  Green Jr M.D.   On: 04/27/2020 16:49    Procedures .Critical Care Performed by: Linwood DibblesHenderly, Emmalynne Courtney A, PA-C Authorized by: Linwood DibblesHenderly, Tayven Renteria A, PA-C   Critical care provider statement:    Critical care time (minutes):  45   Critical care was necessary to treat or prevent imminent or life-threatening deterioration of the following conditions:  Metabolic crisis (hypokalemia with EKG changes requring admission)   Critical care was time spent personally by me on the following activities:  Discussions with consultants, evaluation of patient's response to treatment, examination of patient, ordering and performing treatments and interventions, ordering and review of laboratory studies, ordering and review of radiographic studies, pulse oximetry, re-evaluation of patient's condition, obtaining history  from patient or surrogate and  review of old charts   (including critical care time)  Medications Ordered in ED Medications  potassium chloride 10 mEq in 100 mL IVPB (10 mEq Intravenous New Bag/Given 04/27/20 2049)  LORazepam (ATIVAN) injection 1 mg (has no administration in time range)  sodium chloride 0.9 % bolus 1,000 mL (0 mLs Intravenous Stopped 04/27/20 1812)  LORazepam (ATIVAN) injection 1 mg (1 mg Intravenous Given 04/27/20 1706)  magnesium sulfate IVPB 2 g 50 mL (0 g Intravenous Stopped 04/27/20 2006)    ED Course  I have reviewed the triage vital signs and the nursing notes.  Pertinent labs & imaging results that were available during my care of the patient were reviewed by me and considered in my medical decision making (see chart for details).  29 year old presents for evaluation of shortness of breath.  Was outside yesterday.  Felt wheezy earlier today and used an older albuterol inhaler.  Did not help.  EMS was subsequently called.  Was noted to have diffuse expiratory wheeze.  Was given 125 Solu-Medrol and albuterol nebulizer with significant improvement.  On arrival patient has some minimal chest tightness however feels significantly improved.  She has lungs clear to auscultation bilaterally.  No rhinorrhea, prior cough to suggest Covid infection.  No unilateral leg swelling, redness or warmth to suggest PE as cause of her shortness of breath.  No chest pain suggestive of ACS.  Will obtain EKG and chest x-ray  Patient's EKG with marked prolonged QT interval  Chest x-ray without pneumothorax, infiltrates, cardiomegaly, pulmonary edema  Concern patient's prolonged QT interval is due to metabolic derangements given her history of hypokalemia with recent gastric bypass procedure procedure in April.  We will plan on obtaining labs.  Will avoid prolonged QT intervals medications while here in the emergency department.  Patient states she does take Zofran daily for chronic nausea after her  bypass surgery.  Labs personally reviewed and interpreted Metabolic panel with significant hypokalemia at 2.4 Magnesium 2.0 CBC without leukocytosis  I discussed patient's critically low hypokalemia and her EKG changes.  Recommended inpatient admission given severe hypokalemia with subsequent changes to EKG.  She is agreeable to this.  Patient started on IV supplementation of potassium and magnesium.  Will consult with hospitalist for admission.  Patient states she does feel significantly improved as far as her respiratory status is concerned.  She has needed medication for her nausea which she states is chronic in nature.  She was given Ativan due to her prolonged QT interval  Question whether patient's hypokalemia was due to her albuterol however patient only had 2 puffs of an albuterol inhaler as well as one nebulizer I have low suspicion that her significant drop and her potassium is solely due to her old butyryl treatment.  Patient is ready having some persistent nausea and dry heaving here in the emergency department.  I do not think she be able to tolerate bolusing p.o. potassium and discharging home.  We will plan on consulting with hospitalist for admission for critically low potassium with EKG changes  CONSULT with Dr. Dorthula Perfect- Camillo Flaming  with [facility] who agrees to accept patient in transfer  Discussed with attending, Dr. Deretha Emory who agrees above treatment, plan disposition.  Patient with persistent nausea.  Will order Ativan.    MDM Rules/Calculators/A&P                          ARIELYS WANDERSEE was evaluated in Emergency Department on 04/27/2020 for the  symptoms described in the history of present illness. She was evaluated in the context of the global COVID-19 pandemic, which necessitated consideration that the patient might be at risk for infection with the SARS-CoV-2 virus that causes COVID-19. Institutional protocols and algorithms that pertain to the evaluation of patients at risk  for COVID-19 are in a state of rapid change based on information released by regulatory bodies including the CDC and federal and state organizations. These policies and algorithms were followed during the patient's care in the ED. Final Clinical Impression(s) / ED Diagnoses Final diagnoses:  Mild persistent asthma with exacerbation  Hypokalemia    Rx / DC Orders ED Discharge Orders    None       Walida Cajas A, PA-C 04/27/20 2146    Milicent Acheampong A, PA-C 04/27/20 2147    Shon Baton, MD 04/28/20 [phone]

## 2020-04-27 NOTE — ED Triage Notes (Addendum)
Mowed grass yesterday and started an asthma attack this morning.  Went to work but got worse. Albuterol Inhaler did not touch it. (31 year old inhaler)  EMS called.  Exp wheezing upon EMS arrival.  Albuterol 5mg  neb and Solumedrol 125mg  IV given by EMS. Marked improvement since ems tx.

## 2020-04-28 LAB — BASIC METABOLIC PANEL
Anion gap: 7 (ref 5–15)
BUN: 9 mg/dL (ref 6–20)
CO2: 22 mmol/L (ref 22–32)
Calcium: 8.7 mg/dL — ABNORMAL LOW (ref 8.9–10.3)
Chloride: 110 mmol/L (ref 98–111)
Creatinine, Ser: 0.69 mg/dL (ref 0.44–1.00)
GFR calc Af Amer: >60 mL/min (ref >60)
GFR calc non Af Amer: >60 mL/min (ref >60)
Glucose, Bld: 142 mg/dL — ABNORMAL HIGH (ref 70–99)
Potassium: 3.5 mmol/L (ref 3.5–5.1)
Sodium: 139 mmol/L (ref 135–145)

## 2020-04-28 MED ORDER — ALBUTEROL SULFATE HFA 108 (90 BASE) MCG/ACT IN AERS: 2.0000 | INHALATION_SPRAY | RESPIRATORY_TRACT | 0 refills | Status: DC | PRN

## 2020-04-28 MED ORDER — LORAZEPAM 1 MG PO TABS: 1.0000 mg | ORAL_TABLET | Freq: Three times a day (TID) | ORAL | 0 refills | Status: DC | PRN

## 2020-04-28 MED ORDER — POTASSIUM CHLORIDE CRYS ER 20 MEQ PO TBCR: 20.0000 meq | EXTENDED_RELEASE_TABLET | Freq: Two times a day (BID) | ORAL | 0 refills | Status: DC

## 2020-04-28 NOTE — ED Notes (Signed)
ED Provider at bedside. 

## 2020-04-28 NOTE — Discharge Instructions (Addendum)
You were seen today for asthma.  Continue your inhaler.  You were found to have significantly low potassium.  Start taking potassium as prescribed and follow-up with your primary physician in 1 week for potassium recheck.  Additionally, you should avoid QT prolonging medications including using Zofran sparingly.

## 2020-05-03 DIAGNOSIS — J019 Acute sinusitis, unspecified: Secondary | ICD-10-CM | POA: Diagnosis not present

## 2020-05-05 DIAGNOSIS — Z713 Dietary counseling and surveillance: Secondary | ICD-10-CM | POA: Diagnosis not present

## 2020-05-05 DIAGNOSIS — Z9884 Bariatric surgery status: Secondary | ICD-10-CM | POA: Diagnosis not present

## 2020-05-14 DIAGNOSIS — R194 Change in bowel habit: Secondary | ICD-10-CM | POA: Diagnosis not present

## 2020-05-14 DIAGNOSIS — R112 Nausea with vomiting, unspecified: Secondary | ICD-10-CM | POA: Diagnosis not present

## 2020-05-14 DIAGNOSIS — K921 Melena: Secondary | ICD-10-CM | POA: Diagnosis not present

## 2020-05-14 DIAGNOSIS — R1084 Generalized abdominal pain: Secondary | ICD-10-CM | POA: Diagnosis not present

## 2020-05-17 DIAGNOSIS — R112 Nausea with vomiting, unspecified: Secondary | ICD-10-CM | POA: Diagnosis not present

## 2020-05-17 DIAGNOSIS — K3189 Other diseases of stomach and duodenum: Secondary | ICD-10-CM | POA: Diagnosis not present

## 2020-05-17 DIAGNOSIS — R194 Change in bowel habit: Secondary | ICD-10-CM | POA: Diagnosis not present

## 2020-05-17 DIAGNOSIS — K640 First degree hemorrhoids: Secondary | ICD-10-CM | POA: Diagnosis not present

## 2020-05-17 DIAGNOSIS — K921 Melena: Secondary | ICD-10-CM | POA: Diagnosis not present

## 2020-05-19 ENCOUNTER — Ambulatory Visit (HOSPITAL_COMMUNITY)
Admission: EM | Admit: 2020-05-19 | Discharge: 2020-05-19 | Disposition: A | Discharge status: Home / Self Care | Payer: BC Managed Care – PPO | Attending: Registered Nurse | Admitting: Registered Nurse

## 2020-05-19 ENCOUNTER — Encounter (HOSPITAL_COMMUNITY): Payer: Self-pay | Admitting: Registered Nurse

## 2020-05-19 ENCOUNTER — Other Ambulatory Visit: Payer: Self-pay

## 2020-05-19 DIAGNOSIS — F411 Generalized anxiety disorder: Secondary | ICD-10-CM | POA: Diagnosis present

## 2020-05-19 DIAGNOSIS — G4733 Obstructive sleep apnea (adult) (pediatric): Secondary | ICD-10-CM | POA: Diagnosis not present

## 2020-05-19 MED ORDER — HYDROXYZINE HCL 25 MG PO TABS
25.0000 mg | ORAL_TABLET | Freq: Three times a day (TID) | ORAL | 0 refills | Status: DC | PRN
Start: 2020-05-19 — End: 2020-11-09

## 2020-05-19 MED ORDER — HYDROXYZINE HCL 25 MG PO TABS: 25.0000 mg | ORAL_TABLET | Freq: Three times a day (TID) | ORAL | Status: DC | PRN
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
## 2020-05-19 NOTE — ED Provider Notes (Signed)
Behavioral Health Urgent Care Medical Screening Exam  Patient Name: [name] MRN: [id number] Date of Evaluation: 05/19/20 Chief Complaint:   Diagnosis:  Final diagnoses:  GAD (generalized anxiety disorder)    History of Present illness: [name] is a 29 y.o. female presented to [facility] as a walk in with complaints worsening anxiety and panic attacks.  Patient states that she lost 3 friends over the weekend and anxiety worsened.  Patient states that her PCP prescribes her medication and states she ran out over the weekend and did not get to pick up until Monday but has restarted.  States that she has Klonopin that is only prescribed for bed time and did not want to take any during the day.  Discussed Vistaril and instructed to call her doctor to asked about giving extra Klonopin for 10 day or so to help get through crisis.  Patient denies prior psychiatric hospitalization, suicide attempt, and violence.   During evaluation [name] is alert/oriented x 4; calm/cooperative; and mood is congruent with affect.  He/She does not appear to be responding to internal/external stimuli or delusional thoughts.  Patient denies suicidal/self-harm/homicidal ideation, psychosis, and paranoia.  Patient answered question appropriately.        Psychiatric Specialty Exam  Presentation  General Appearance:Appropriate for Environment;Casual  Eye Contact:Good  Speech:Clear and Coherent;Normal Rate  Speech Volume:Normal  Handedness:Right   Mood and Affect  Mood:Anxious  Affect:Appropriate;Congruent   Thought Process  Thought Processes:Coherent;Goal Directed  Descriptions of Associations:Intact  Orientation:Full (Time, Place and Person)  Thought Content:WDL  Hallucinations:None  Ideas of Reference:None  Suicidal Thoughts:No  Homicidal Thoughts:No   Sensorium  Memory:Immediate Good;Recent Good;Remote  Good  Judgment:Intact  Insight:Good;Present   Executive Functions  Concentration:Good  Attention Span:Good  Recall:Good  Fund of Knowledge:Good  Language:Good   Psychomotor Activity  Psychomotor Activity:Normal   Assets  Assets:Communication Skills;Desire for Improvement;Financial Resources/Insurance;Housing;Leisure Time;Social Support;Transportation   Sleep  Sleep:Good  Number of hours: No data recorded  Physical Exam: Physical Exam Vitals and nursing note reviewed.  Constitutional:      Appearance: Normal appearance. She is obese.  HENT:     Head: Normocephalic.  Cardiovascular:     Rate and Rhythm: Normal rate and regular rhythm.  Pulmonary:     Effort: Pulmonary effort is normal.     Breath sounds: Normal breath sounds.  Abdominal:     General: Bowel sounds are normal.  Musculoskeletal:        General: Normal range of motion.     Cervical back: Normal range of motion.  Skin:    General: Skin is warm and dry.     Findings: No bruising or rash.  Neurological:     Mental Status: She is alert.  Psychiatric:        Attention and Perception: Attention and perception normal.        Mood and Affect: Mood is anxious and depressed. Affect is tearful.        Speech: Speech normal.        Behavior: Behavior normal. Behavior is cooperative.        Thought Content: Thought content normal. Thought content is not paranoid or delusional. Thought content does not include homicidal or suicidal ideation.        Cognition and Memory: Cognition and memory normal.        Judgment: Judgment normal.    Review of Systems  Psychiatric/Behavioral: Negative for memory loss. Depression: Stable. Hallucinations: Denies. Substance abuse: Denies. Suicidal  ideas: Denies. The patient does not have insomnia. Nervous/anxious: Worsening over the last week peeked over the weekend.  Seeking therapy to have someone to talk to.   All other systems reviewed and are negative.  Blood  pressure 109/80, pulse 67, temperature 98.4 F (36.9 C), temperature source Oral, resp. rate 18, height 5\' 8"  (1.727 m), weight 224 lb (101.6 kg), SpO2 100 %. Body mass index is 34.06 kg/m.  Musculoskeletal: Strength & Muscle Tone: within normal limits Gait & Station: normal Patient leans: N/A   [facility] MSE Discharge Disposition for Follow up and Recommendations: Based on my evaluation the patient does not appear to have an emergency medical condition and can be discharged with resources and follow up care in outpatient services for Medication Management and Individual Therapy   Cassie Timko, NP 05/19/2020, 5:46 PM

## 2020-06-04 DIAGNOSIS — I10 Essential (primary) hypertension: Secondary | ICD-10-CM | POA: Diagnosis not present

## 2020-06-04 DIAGNOSIS — F411 Generalized anxiety disorder: Secondary | ICD-10-CM | POA: Diagnosis not present

## 2020-06-04 DIAGNOSIS — F5104 Psychophysiologic insomnia: Secondary | ICD-10-CM | POA: Diagnosis not present

## 2020-06-04 DIAGNOSIS — F331 Major depressive disorder, recurrent, moderate: Secondary | ICD-10-CM | POA: Diagnosis not present

## 2020-06-04 DIAGNOSIS — Z9884 Bariatric surgery status: Secondary | ICD-10-CM | POA: Diagnosis not present

## 2020-06-04 DIAGNOSIS — E782 Mixed hyperlipidemia: Secondary | ICD-10-CM | POA: Diagnosis not present

## 2020-06-04 DIAGNOSIS — F4310 Post-traumatic stress disorder, unspecified: Secondary | ICD-10-CM | POA: Diagnosis not present

## 2020-06-04 DIAGNOSIS — T148XXA Other injury of unspecified body region, initial encounter: Secondary | ICD-10-CM | POA: Diagnosis not present

## 2020-06-04 DIAGNOSIS — F410 Panic disorder [episodic paroxysmal anxiety] without agoraphobia: Secondary | ICD-10-CM | POA: Diagnosis not present

## 2020-06-04 DIAGNOSIS — R42 Dizziness and giddiness: Secondary | ICD-10-CM | POA: Diagnosis not present

## 2020-06-04 DIAGNOSIS — R5383 Other fatigue: Secondary | ICD-10-CM | POA: Diagnosis not present

## 2020-06-11 DIAGNOSIS — F411 Generalized anxiety disorder: Secondary | ICD-10-CM | POA: Diagnosis not present

## 2020-06-18 DIAGNOSIS — F411 Generalized anxiety disorder: Secondary | ICD-10-CM | POA: Diagnosis not present

## 2020-06-25 DIAGNOSIS — I10 Essential (primary) hypertension: Secondary | ICD-10-CM | POA: Diagnosis not present

## 2020-06-25 DIAGNOSIS — R002 Palpitations: Secondary | ICD-10-CM | POA: Diagnosis not present

## 2020-06-25 DIAGNOSIS — R001 Bradycardia, unspecified: Secondary | ICD-10-CM | POA: Diagnosis not present

## 2020-06-25 DIAGNOSIS — R55 Syncope and collapse: Secondary | ICD-10-CM | POA: Diagnosis not present

## 2020-07-01 DIAGNOSIS — F411 Generalized anxiety disorder: Secondary | ICD-10-CM | POA: Diagnosis not present

## 2020-07-09 DIAGNOSIS — Z01419 Encounter for gynecological examination (general) (routine) without abnormal findings: Secondary | ICD-10-CM | POA: Diagnosis not present

## 2020-07-09 DIAGNOSIS — I10 Essential (primary) hypertension: Secondary | ICD-10-CM | POA: Diagnosis not present

## 2020-07-09 DIAGNOSIS — Z9884 Bariatric surgery status: Secondary | ICD-10-CM | POA: Diagnosis not present

## 2020-07-09 DIAGNOSIS — Z113 Encounter for screening for infections with a predominantly sexual mode of transmission: Secondary | ICD-10-CM | POA: Diagnosis not present

## 2020-07-09 DIAGNOSIS — Z124 Encounter for screening for malignant neoplasm of cervix: Secondary | ICD-10-CM | POA: Diagnosis not present

## 2020-07-09 DIAGNOSIS — F411 Generalized anxiety disorder: Secondary | ICD-10-CM | POA: Diagnosis not present

## 2020-07-16 DIAGNOSIS — R002 Palpitations: Secondary | ICD-10-CM | POA: Diagnosis not present

## 2020-07-16 DIAGNOSIS — R55 Syncope and collapse: Secondary | ICD-10-CM | POA: Diagnosis not present

## 2020-07-16 DIAGNOSIS — R001 Bradycardia, unspecified: Secondary | ICD-10-CM | POA: Diagnosis not present

## 2020-07-23 DIAGNOSIS — F4310 Post-traumatic stress disorder, unspecified: Secondary | ICD-10-CM | POA: Diagnosis not present

## 2020-07-30 DIAGNOSIS — F411 Generalized anxiety disorder: Secondary | ICD-10-CM | POA: Diagnosis not present

## 2020-08-02 DIAGNOSIS — R001 Bradycardia, unspecified: Secondary | ICD-10-CM | POA: Diagnosis not present

## 2020-08-02 DIAGNOSIS — R002 Palpitations: Secondary | ICD-10-CM | POA: Diagnosis not present

## 2020-08-02 DIAGNOSIS — R55 Syncope and collapse: Secondary | ICD-10-CM | POA: Diagnosis not present

## 2020-08-06 DIAGNOSIS — F411 Generalized anxiety disorder: Secondary | ICD-10-CM | POA: Diagnosis not present

## 2020-08-11 DIAGNOSIS — F411 Generalized anxiety disorder: Secondary | ICD-10-CM | POA: Diagnosis not present

## 2020-09-03 DIAGNOSIS — F411 Generalized anxiety disorder: Secondary | ICD-10-CM | POA: Diagnosis not present

## 2020-10-01 DIAGNOSIS — F411 Generalized anxiety disorder: Secondary | ICD-10-CM | POA: Diagnosis not present

## 2020-10-08 DIAGNOSIS — F411 Generalized anxiety disorder: Secondary | ICD-10-CM | POA: Diagnosis not present

## 2020-10-15 ENCOUNTER — Encounter (HOSPITAL_COMMUNITY): Payer: Self-pay | Admitting: Pharmacy Technician

## 2020-10-15 ENCOUNTER — Emergency Department (HOSPITAL_COMMUNITY)
Admission: EM | Admit: 2020-10-15 | Discharge: 2020-10-15 | Disposition: A | Discharge status: Home / Self Care | Payer: BC Managed Care – PPO | Attending: Emergency Medicine | Admitting: Emergency Medicine

## 2020-10-15 ENCOUNTER — Emergency Department (HOSPITAL_COMMUNITY): Payer: BC Managed Care – PPO

## 2020-10-15 DIAGNOSIS — M545 Low back pain, unspecified: Secondary | ICD-10-CM | POA: Diagnosis not present

## 2020-10-15 DIAGNOSIS — Y92410 Unspecified street and highway as the place of occurrence of the external cause: Secondary | ICD-10-CM | POA: Diagnosis not present

## 2020-10-15 DIAGNOSIS — M79651 Pain in right thigh: Secondary | ICD-10-CM | POA: Diagnosis not present

## 2020-10-15 DIAGNOSIS — Z79899 Other long term (current) drug therapy: Secondary | ICD-10-CM | POA: Diagnosis not present

## 2020-10-15 DIAGNOSIS — M25551 Pain in right hip: Secondary | ICD-10-CM | POA: Diagnosis not present

## 2020-10-15 DIAGNOSIS — M546 Pain in thoracic spine: Secondary | ICD-10-CM | POA: Insufficient documentation

## 2020-10-15 DIAGNOSIS — Z87891 Personal history of nicotine dependence: Secondary | ICD-10-CM | POA: Insufficient documentation

## 2020-10-15 DIAGNOSIS — M25561 Pain in right knee: Secondary | ICD-10-CM | POA: Insufficient documentation

## 2020-10-15 DIAGNOSIS — M25521 Pain in right elbow: Secondary | ICD-10-CM | POA: Diagnosis not present

## 2020-10-15 DIAGNOSIS — S6991XA Unspecified injury of right wrist, hand and finger(s), initial encounter: Secondary | ICD-10-CM | POA: Diagnosis not present

## 2020-10-15 DIAGNOSIS — S50311A Abrasion of right elbow, initial encounter: Secondary | ICD-10-CM | POA: Insufficient documentation

## 2020-10-15 DIAGNOSIS — S50811A Abrasion of right forearm, initial encounter: Secondary | ICD-10-CM | POA: Diagnosis not present

## 2020-10-15 DIAGNOSIS — J45909 Unspecified asthma, uncomplicated: Secondary | ICD-10-CM | POA: Insufficient documentation

## 2020-10-15 DIAGNOSIS — K912 Postsurgical malabsorption, not elsewhere classified: Secondary | ICD-10-CM | POA: Diagnosis not present

## 2020-10-15 DIAGNOSIS — Z9884 Bariatric surgery status: Secondary | ICD-10-CM | POA: Diagnosis not present

## 2020-10-15 DIAGNOSIS — I10 Essential (primary) hypertension: Secondary | ICD-10-CM | POA: Diagnosis not present

## 2020-10-15 DIAGNOSIS — F411 Generalized anxiety disorder: Secondary | ICD-10-CM | POA: Diagnosis not present

## 2020-10-15 DIAGNOSIS — R110 Nausea: Secondary | ICD-10-CM | POA: Diagnosis not present

## 2020-10-15 DIAGNOSIS — S60121A Contusion of right index finger with damage to nail, initial encounter: Secondary | ICD-10-CM | POA: Diagnosis not present

## 2020-10-15 DIAGNOSIS — M79641 Pain in right hand: Secondary | ICD-10-CM | POA: Diagnosis not present

## 2020-10-15 DIAGNOSIS — S8991XA Unspecified injury of right lower leg, initial encounter: Secondary | ICD-10-CM | POA: Diagnosis not present

## 2020-10-15 DIAGNOSIS — Z041 Encounter for examination and observation following transport accident: Secondary | ICD-10-CM | POA: Diagnosis not present

## 2020-10-15 DIAGNOSIS — M79604 Pain in right leg: Secondary | ICD-10-CM | POA: Diagnosis not present

## 2020-10-15 DIAGNOSIS — F988 Other specified behavioral and emotional disorders with onset usually occurring in childhood and adolescence: Secondary | ICD-10-CM | POA: Diagnosis not present

## 2020-10-15 LAB — COMPREHENSIVE METABOLIC PANEL
ALT: 16 U/L (ref 0–44)
AST: 16 U/L (ref 15–41)
Albumin: 3.2 g/dL — ABNORMAL LOW (ref 3.5–5.0)
Alkaline Phosphatase: 64 U/L (ref 38–126)
Anion gap: 10 (ref 5–15)
BUN: 12 mg/dL (ref 6–20)
CO2: 22 mmol/L (ref 22–32)
Calcium: 8.9 mg/dL (ref 8.9–10.3)
Chloride: 106 mmol/L (ref 98–111)
Creatinine, Ser: 0.78 mg/dL (ref 0.44–1.00)
GFR, Estimated: >60 mL/min (ref >60)
Glucose, Bld: 87 mg/dL (ref 70–99)
Potassium: 4 mmol/L (ref 3.5–5.1)
Sodium: 138 mmol/L (ref 135–145)
Total Bilirubin: 0.6 mg/dL (ref 0.3–1.2)
Total Protein: 5.8 g/dL — ABNORMAL LOW (ref 6.5–8.1)

## 2020-10-15 LAB — CBC
HCT: 42.4 % (ref 36.0–46.0)
Hemoglobin: 14.7 g/dL (ref 12.0–15.0)
MCH: 31.5 pg (ref 26.0–34.0)
MCHC: 34.7 g/dL (ref 30.0–36.0)
MCV: 90.8 fL (ref 80.0–100.0)
Platelets: 170 10*3/uL (ref 150–400)
RBC: 4.67 MIL/uL (ref 3.87–5.11)
RDW: 11.9 % (ref 11.5–15.5)
WBC: 6.4 10*3/uL (ref 4.0–10.5)
nRBC: 0 % (ref 0.0–0.2)

## 2020-10-15 LAB — HCG, QUANTITATIVE, PREGNANCY: hCG, Beta Chain, Quant, S: 1 m[IU]/mL (ref <5)

## 2020-10-15 MED ORDER — KETOROLAC TROMETHAMINE 15 MG/ML IJ SOLN
15.0000 mg | Freq: Once | INTRAMUSCULAR | Status: AC
  Administered 2020-10-15: 15 mg via INTRAVENOUS
  Filled 2020-10-15: qty 1

## 2020-10-15 MED ORDER — FENTANYL CITRATE (PF) 100 MCG/2ML IJ SOLN
100.0000 ug | Freq: Once | INTRAMUSCULAR | Status: AC
  Administered 2020-10-15: 100 ug via INTRAVENOUS
  Filled 2020-10-15: qty 2

## 2020-10-15 MED ORDER — LIDOCAINE 5 % EX PTCH: 1.0000 | MEDICATED_PATCH | CUTANEOUS | 0 refills | Status: DC

## 2020-10-15 MED ORDER — FENTANYL CITRATE (PF) 100 MCG/2ML IJ SOLN
50.0000 ug | INTRAMUSCULAR | Status: DC | PRN
  Administered 2020-10-15: 50 ug via INTRAVENOUS
  Filled 2020-10-15: qty 2

## 2020-10-15 MED ORDER — ACETAMINOPHEN 325 MG PO TABS
650.0000 mg | ORAL_TABLET | Freq: Once | ORAL | Status: AC
  Administered 2020-10-15: 650 mg via ORAL
  Filled 2020-10-15: qty 2

## 2020-10-15 MED ORDER — GABAPENTIN 300 MG PO CAPS: 300.0000 mg | ORAL_CAPSULE | Freq: Three times a day (TID) | ORAL | 0 refills | Status: AC
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
## 2020-10-16 ENCOUNTER — Other Ambulatory Visit: Payer: Self-pay

## 2020-10-16 ENCOUNTER — Encounter (HOSPITAL_COMMUNITY): Payer: Self-pay | Admitting: Student

## 2020-10-16 ENCOUNTER — Emergency Department (HOSPITAL_COMMUNITY)
Admission: EM | Admit: 2020-10-16 | Discharge: 2020-10-17 | Disposition: A | Discharge status: Home / Self Care | Payer: BC Managed Care – PPO | Attending: Emergency Medicine | Admitting: Emergency Medicine

## 2020-10-16 DIAGNOSIS — S40811D Abrasion of right upper arm, subsequent encounter: Secondary | ICD-10-CM | POA: Diagnosis not present

## 2020-10-16 DIAGNOSIS — S3991XA Unspecified injury of abdomen, initial encounter: Secondary | ICD-10-CM | POA: Diagnosis not present

## 2020-10-16 DIAGNOSIS — M542 Cervicalgia: Secondary | ICD-10-CM | POA: Diagnosis not present

## 2020-10-16 DIAGNOSIS — S0990XA Unspecified injury of head, initial encounter: Secondary | ICD-10-CM | POA: Diagnosis not present

## 2020-10-16 DIAGNOSIS — S0990XD Unspecified injury of head, subsequent encounter: Secondary | ICD-10-CM | POA: Diagnosis not present

## 2020-10-16 DIAGNOSIS — M545 Low back pain, unspecified: Secondary | ICD-10-CM | POA: Insufficient documentation

## 2020-10-16 DIAGNOSIS — Z87891 Personal history of nicotine dependence: Secondary | ICD-10-CM | POA: Diagnosis not present

## 2020-10-16 DIAGNOSIS — R0789 Other chest pain: Secondary | ICD-10-CM | POA: Diagnosis not present

## 2020-10-16 DIAGNOSIS — K3189 Other diseases of stomach and duodenum: Secondary | ICD-10-CM | POA: Diagnosis not present

## 2020-10-16 DIAGNOSIS — Z041 Encounter for examination and observation following transport accident: Secondary | ICD-10-CM | POA: Diagnosis not present

## 2020-10-16 DIAGNOSIS — S299XXA Unspecified injury of thorax, initial encounter: Secondary | ICD-10-CM | POA: Diagnosis not present

## 2020-10-16 DIAGNOSIS — S3992XA Unspecified injury of lower back, initial encounter: Secondary | ICD-10-CM | POA: Diagnosis not present

## 2020-10-16 DIAGNOSIS — J441 Chronic obstructive pulmonary disease with (acute) exacerbation: Secondary | ICD-10-CM | POA: Diagnosis not present

## 2020-10-16 DIAGNOSIS — R10817 Generalized abdominal tenderness: Secondary | ICD-10-CM | POA: Insufficient documentation

## 2020-10-16 DIAGNOSIS — S4991XD Unspecified injury of right shoulder and upper arm, subsequent encounter: Secondary | ICD-10-CM | POA: Diagnosis not present

## 2020-10-16 DIAGNOSIS — Y92410 Unspecified street and highway as the place of occurrence of the external cause: Secondary | ICD-10-CM | POA: Diagnosis not present

## 2020-10-16 DIAGNOSIS — M549 Dorsalgia, unspecified: Secondary | ICD-10-CM

## 2020-10-16 DIAGNOSIS — I10 Essential (primary) hypertension: Secondary | ICD-10-CM | POA: Diagnosis not present

## 2020-10-16 DIAGNOSIS — J9811 Atelectasis: Secondary | ICD-10-CM | POA: Diagnosis not present

## 2020-10-16 MED ORDER — ONDANSETRON HCL 4 MG/2ML IJ SOLN
4.0000 mg | Freq: Once | INTRAMUSCULAR | Status: AC
  Administered 2020-10-17: 4 mg via INTRAVENOUS
  Filled 2020-10-16: qty 2

## 2020-10-16 MED ORDER — FENTANYL CITRATE (PF) 100 MCG/2ML IJ SOLN
50.0000 ug | Freq: Once | INTRAMUSCULAR | Status: AC
  Administered 2020-10-17: 50 ug via INTRAVENOUS
  Filled 2020-10-16: qty 2

## 2020-10-16 MED ORDER — SODIUM CHLORIDE 0.9 % IV BOLUS
1000.0000 mL | Freq: Once | INTRAVENOUS | Status: AC
  Administered 2020-10-17: 1000 mL via INTRAVENOUS
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
## 2020-10-16 NOTE — ED Notes (Signed)
Crying with pain all over her body

## 2020-10-17 ENCOUNTER — Emergency Department (HOSPITAL_COMMUNITY): Payer: BC Managed Care – PPO

## 2020-10-17 DIAGNOSIS — Z041 Encounter for examination and observation following transport accident: Secondary | ICD-10-CM | POA: Diagnosis not present

## 2020-10-17 DIAGNOSIS — S299XXA Unspecified injury of thorax, initial encounter: Secondary | ICD-10-CM | POA: Diagnosis not present

## 2020-10-17 DIAGNOSIS — J9811 Atelectasis: Secondary | ICD-10-CM | POA: Diagnosis not present

## 2020-10-17 DIAGNOSIS — K3189 Other diseases of stomach and duodenum: Secondary | ICD-10-CM | POA: Diagnosis not present

## 2020-10-17 DIAGNOSIS — S3991XA Unspecified injury of abdomen, initial encounter: Secondary | ICD-10-CM | POA: Diagnosis not present

## 2020-10-17 DIAGNOSIS — S3992XA Unspecified injury of lower back, initial encounter: Secondary | ICD-10-CM | POA: Diagnosis not present

## 2020-10-17 LAB — CBC WITH DIFFERENTIAL/PLATELET
Abs Immature Granulocytes: 0.03 10*3/uL (ref 0.00–0.07)
Basophils Absolute: 0 10*3/uL (ref 0.0–0.1)
Basophils Relative: 1 %
Eosinophils Absolute: 0.2 10*3/uL (ref 0.0–0.5)
Eosinophils Relative: 2 %
HCT: 36.6 % (ref 36.0–46.0)
Hemoglobin: 12.6 g/dL (ref 12.0–15.0)
Immature Granulocytes: 0 %
Lymphocytes Relative: 39 %
Lymphs Abs: 3.5 10*3/uL (ref 0.7–4.0)
MCH: 32.1 pg (ref 26.0–34.0)
MCHC: 34.4 g/dL (ref 30.0–36.0)
MCV: 93.4 fL (ref 80.0–100.0)
Monocytes Absolute: 0.6 10*3/uL (ref 0.1–1.0)
Monocytes Relative: 7 %
Neutro Abs: 4.5 10*3/uL (ref 1.7–7.7)
Neutrophils Relative %: 51 %
Platelets: 236 10*3/uL (ref 150–400)
RBC: 3.92 MIL/uL (ref 3.87–5.11)
RDW: 11.9 % (ref 11.5–15.5)
WBC: 8.9 10*3/uL (ref 4.0–10.5)
nRBC: 0 % (ref 0.0–0.2)

## 2020-10-17 LAB — COMPREHENSIVE METABOLIC PANEL
ALT: 18 U/L (ref 0–44)
AST: 15 U/L (ref 15–41)
Albumin: 3.4 g/dL — ABNORMAL LOW (ref 3.5–5.0)
Alkaline Phosphatase: 62 U/L (ref 38–126)
Anion gap: 10 (ref 5–15)
BUN: 13 mg/dL (ref 6–20)
CO2: 22 mmol/L (ref 22–32)
Calcium: 9.3 mg/dL (ref 8.9–10.3)
Chloride: 106 mmol/L (ref 98–111)
Creatinine, Ser: 0.75 mg/dL (ref 0.44–1.00)
GFR, Estimated: >60 mL/min (ref >60)
Glucose, Bld: 85 mg/dL (ref 70–99)
Potassium: 3.8 mmol/L (ref 3.5–5.1)
Sodium: 138 mmol/L (ref 135–145)
Total Bilirubin: 0.7 mg/dL (ref 0.3–1.2)
Total Protein: 6.3 g/dL — ABNORMAL LOW (ref 6.5–8.1)

## 2020-10-17 LAB — I-STAT BETA HCG BLOOD, ED (MC, WL, AP ONLY): I-stat hCG, quantitative: <5 m[IU]/mL (ref <5)

## 2020-10-17 MED ORDER — ONDANSETRON 4 MG PO TBDP: 4.0000 mg | ORAL_TABLET | Freq: Three times a day (TID) | ORAL | 0 refills | Status: DC | PRN

## 2020-10-17 MED ORDER — FENTANYL CITRATE (PF) 100 MCG/2ML IJ SOLN
25.0000 ug | Freq: Once | INTRAMUSCULAR | Status: AC
  Administered 2020-10-17: 25 ug via INTRAVENOUS
  Filled 2020-10-17: qty 2

## 2020-10-17 MED ORDER — FENTANYL CITRATE (PF) 100 MCG/2ML IJ SOLN
50.0000 ug | Freq: Once | INTRAMUSCULAR | Status: AC
  Administered 2020-10-17: 50 ug via INTRAVENOUS
  Filled 2020-10-17: qty 2

## 2020-10-17 MED ORDER — IOHEXOL 300 MG/ML  SOLN
100.0000 mL | Freq: Once | INTRAMUSCULAR | Status: AC | PRN
  Administered 2020-10-17: 100 mL via INTRAVENOUS

## 2020-10-17 MED ORDER — OXYCODONE-ACETAMINOPHEN 5-325 MG PO TABS: 1.0000 | ORAL_TABLET | Freq: Four times a day (QID) | ORAL | 0 refills | Status: DC | PRN

## 2020-10-17 NOTE — ED Notes (Signed)
Pt remains in the hallway

## 2020-10-17 NOTE — ED Notes (Signed)
The pt  Is keeping her eyes open now.  She is making noise like she is crying she is not making any tears
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
## 2020-10-17 NOTE — ED Notes (Signed)
To ct

## 2020-10-18 ENCOUNTER — Telehealth: Payer: Self-pay

## 2020-10-18 DIAGNOSIS — Z903 Acquired absence of stomach [part of]: Secondary | ICD-10-CM | POA: Diagnosis not present

## 2020-10-18 DIAGNOSIS — Z9884 Bariatric surgery status: Secondary | ICD-10-CM | POA: Diagnosis not present

## 2020-10-18 DIAGNOSIS — K912 Postsurgical malabsorption, not elsewhere classified: Secondary | ICD-10-CM | POA: Diagnosis not present
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
## 2020-10-19 ENCOUNTER — Other Ambulatory Visit: Payer: Self-pay

## 2020-10-19 ENCOUNTER — Ambulatory Visit (INDEPENDENT_AMBULATORY_CARE_PROVIDER_SITE_OTHER): Payer: BC Managed Care – PPO | Admitting: Family Medicine

## 2020-10-19 ENCOUNTER — Encounter: Payer: Self-pay | Admitting: Family Medicine

## 2020-10-19 VITALS — BP 102/68 | HR 70 | Ht 68.0 in | Wt 183.0 lb

## 2020-10-19 DIAGNOSIS — S060X0A Concussion without loss of consciousness, initial encounter: Secondary | ICD-10-CM

## 2020-10-19 DIAGNOSIS — M542 Cervicalgia: Secondary | ICD-10-CM | POA: Diagnosis not present

## 2020-10-19 DIAGNOSIS — M5416 Radiculopathy, lumbar region: Secondary | ICD-10-CM

## 2020-10-19 MED ORDER — NORTRIPTYLINE HCL 25 MG PO CAPS: 25.0000 mg | ORAL_CAPSULE | Freq: Every day | ORAL | 2 refills | Status: DC
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
## 2020-10-19 NOTE — Patient Instructions (Addendum)
Thank you for coming in today.  Take the notryptline at bedtime for headache.   STOP zoifran.   Try meclizine for nausea as needed.   You are on multiple medicines that are sedating. Pay attention to your mental state does it worsen with medicine.  Get CDs made of your MRIs and bring them to the next appointment.   Schedule with me late next week or early the week after.   Out of work at 2 weeks.   I've referred you to Physical Therapy.  Let us know if you don't hear from them in one week.

## 2020-10-21 DIAGNOSIS — M47812 Spondylosis without myelopathy or radiculopathy, cervical region: Secondary | ICD-10-CM | POA: Diagnosis not present

## 2020-10-21 DIAGNOSIS — R202 Paresthesia of skin: Secondary | ICD-10-CM | POA: Diagnosis not present

## 2020-10-21 DIAGNOSIS — M47816 Spondylosis without myelopathy or radiculopathy, lumbar region: Secondary | ICD-10-CM | POA: Diagnosis not present

## 2020-10-21 DIAGNOSIS — M50823 Other cervical disc disorders at C6-C7 level: Secondary | ICD-10-CM | POA: Diagnosis not present

## 2020-10-21 DIAGNOSIS — M4802 Spinal stenosis, cervical region: Secondary | ICD-10-CM | POA: Diagnosis not present

## 2020-10-21 DIAGNOSIS — M2578 Osteophyte, vertebrae: Secondary | ICD-10-CM | POA: Diagnosis not present

## 2020-10-21 DIAGNOSIS — R200 Anesthesia of skin: Secondary | ICD-10-CM | POA: Diagnosis not present

## 2020-10-21 DIAGNOSIS — M5187 Other intervertebral disc disorders, lumbosacral region: Secondary | ICD-10-CM | POA: Diagnosis not present

## 2020-10-21 DIAGNOSIS — M5186 Other intervertebral disc disorders, lumbar region: Secondary | ICD-10-CM | POA: Diagnosis not present

## 2020-10-25 DIAGNOSIS — F411 Generalized anxiety disorder: Secondary | ICD-10-CM | POA: Diagnosis not present

## 2020-10-25 DIAGNOSIS — M5441 Lumbago with sciatica, right side: Secondary | ICD-10-CM | POA: Diagnosis not present

## 2020-10-25 DIAGNOSIS — S060X9D Concussion with loss of consciousness of unspecified duration, subsequent encounter: Secondary | ICD-10-CM | POA: Diagnosis not present

## 2020-10-25 DIAGNOSIS — M5126 Other intervertebral disc displacement, lumbar region: Secondary | ICD-10-CM | POA: Diagnosis not present
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
## 2020-10-28 ENCOUNTER — Encounter: Payer: Self-pay | Admitting: Family Medicine

## 2020-10-28 ENCOUNTER — Other Ambulatory Visit: Payer: Self-pay

## 2020-10-28 ENCOUNTER — Ambulatory Visit (INDEPENDENT_AMBULATORY_CARE_PROVIDER_SITE_OTHER): Payer: BC Managed Care – PPO | Admitting: Family Medicine

## 2020-10-28 VITALS — BP 140/92 | HR 59 | Ht 68.0 in | Wt 184.2 lb

## 2020-10-28 DIAGNOSIS — Z79891 Long term (current) use of opiate analgesic: Secondary | ICD-10-CM | POA: Diagnosis not present

## 2020-10-28 DIAGNOSIS — S060X0D Concussion without loss of consciousness, subsequent encounter: Secondary | ICD-10-CM

## 2020-10-28 DIAGNOSIS — R29898 Other symptoms and signs involving the musculoskeletal system: Secondary | ICD-10-CM

## 2020-10-28 DIAGNOSIS — M545 Low back pain, unspecified: Secondary | ICD-10-CM

## 2020-10-28 DIAGNOSIS — Z5181 Encounter for therapeutic drug level monitoring: Secondary | ICD-10-CM | POA: Diagnosis not present

## 2020-10-28 DIAGNOSIS — F5104 Psychophysiologic insomnia: Secondary | ICD-10-CM | POA: Diagnosis not present

## 2020-10-28 DIAGNOSIS — R42 Dizziness and giddiness: Secondary | ICD-10-CM

## 2020-10-28 DIAGNOSIS — M542 Cervicalgia: Secondary | ICD-10-CM

## 2020-10-28 DIAGNOSIS — Z79899 Other long term (current) drug therapy: Secondary | ICD-10-CM | POA: Diagnosis not present

## 2020-10-28 NOTE — Patient Instructions (Signed)
Thank you for coming in today.  Plan for PT.  I think your leg numbness is not coming from your back but probably coming from your leg itself.  If not better in 1 month I will arrange for a nerve conduction study.   Recheck in 2 weeks.   If you get better sooner you back to work sooner.

## 2020-10-29 DIAGNOSIS — F411 Generalized anxiety disorder: Secondary | ICD-10-CM | POA: Diagnosis not present

## 2020-11-02 ENCOUNTER — Encounter: Payer: Self-pay | Admitting: Physical Therapy

## 2020-11-02 ENCOUNTER — Other Ambulatory Visit: Payer: Self-pay

## 2020-11-02 ENCOUNTER — Ambulatory Visit (INDEPENDENT_AMBULATORY_CARE_PROVIDER_SITE_OTHER): Payer: BC Managed Care – PPO | Admitting: Physical Therapy

## 2020-11-02 DIAGNOSIS — M545 Low back pain, unspecified: Secondary | ICD-10-CM

## 2020-11-02 DIAGNOSIS — H8111 Benign paroxysmal vertigo, right ear: Secondary | ICD-10-CM | POA: Diagnosis not present

## 2020-11-02 DIAGNOSIS — R262 Difficulty in walking, not elsewhere classified: Secondary | ICD-10-CM | POA: Diagnosis not present

## 2020-11-02 DIAGNOSIS — M6281 Muscle weakness (generalized): Secondary | ICD-10-CM

## 2020-11-02 DIAGNOSIS — R29898 Other symptoms and signs involving the musculoskeletal system: Secondary | ICD-10-CM

## 2020-11-02 DIAGNOSIS — R42 Dizziness and giddiness: Secondary | ICD-10-CM | POA: Diagnosis not present
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
## 2020-11-02 NOTE — Patient Instructions (Signed)
Access Code: [facility] URL: [email or web address] Date: 11/02/2020 Prepared by: Reggy Eye  Exercises Supine Lower Trunk Rotation - 1 x daily - 7 x weekly - 3 sets - 10 reps Hooklying Single Knee to Chest Stretch - 1 x daily - 7 x weekly - 3 sets - 1 reps Hooklying Transversus Abdominis Palpation - 1 x daily - 7 x weekly - 1 sets - 10 reps - 3-5 seconds hold

## 2020-11-05 ENCOUNTER — Other Ambulatory Visit: Payer: Self-pay

## 2020-11-05 ENCOUNTER — Ambulatory Visit (INDEPENDENT_AMBULATORY_CARE_PROVIDER_SITE_OTHER): Payer: BC Managed Care – PPO | Admitting: Rehabilitative and Restorative Service Providers"

## 2020-11-05 DIAGNOSIS — R42 Dizziness and giddiness: Secondary | ICD-10-CM

## 2020-11-05 DIAGNOSIS — H8111 Benign paroxysmal vertigo, right ear: Secondary | ICD-10-CM

## 2020-11-05 NOTE — Patient Instructions (Signed)
Access Code: [facility] URL: [email or web address] Date: 11/05/2020 Prepared by: Margretta Ditty  Program Notes CONVERGENCE:  hold the target and move it closer to your nose until it doubles.  Hold for 5 seconds, then move it away from your nose.  Repeat 5 times.    Exercises Supine Lower Trunk Rotation - 1 x daily - 7 x weekly - 3 sets - 10 reps Hooklying Single Knee to Chest Stretch - 1 x daily - 7 x weekly - 3 sets - 1 reps Hooklying Transversus Abdominis Palpation - 1 x daily - 7 x weekly - 1 sets - 10 reps - 3-5 seconds hold Seated Gaze Stabilization with Head Rotation - 2 x daily - 7 x weekly - 1 sets - 10 reps Seated Gaze Stabilization with Head Nod - 2 x daily - 7 x weekly - 1 sets - 10 reps Seated Left Head Turns Vestibular Habituation - 2 x daily - 7 x weekly - 1 sets - 5 reps
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
## 2020-11-09 ENCOUNTER — Ambulatory Visit (INDEPENDENT_AMBULATORY_CARE_PROVIDER_SITE_OTHER): Payer: BC Managed Care – PPO | Admitting: Physical Therapy

## 2020-11-09 ENCOUNTER — Emergency Department (INDEPENDENT_AMBULATORY_CARE_PROVIDER_SITE_OTHER)
Admission: EM | Admit: 2020-11-09 | Discharge: 2020-11-09 | Disposition: A | Discharge status: Home / Self Care | Payer: BC Managed Care – PPO | Source: Home / Self Care

## 2020-11-09 ENCOUNTER — Other Ambulatory Visit: Payer: Self-pay

## 2020-11-09 DIAGNOSIS — S060X0S Concussion without loss of consciousness, sequela: Secondary | ICD-10-CM | POA: Diagnosis not present

## 2020-11-09 DIAGNOSIS — F411 Generalized anxiety disorder: Secondary | ICD-10-CM | POA: Diagnosis not present

## 2020-11-09 DIAGNOSIS — H8111 Benign paroxysmal vertigo, right ear: Secondary | ICD-10-CM

## 2020-11-09 DIAGNOSIS — M545 Low back pain, unspecified: Secondary | ICD-10-CM | POA: Diagnosis not present

## 2020-11-09 DIAGNOSIS — R262 Difficulty in walking, not elsewhere classified: Secondary | ICD-10-CM | POA: Diagnosis not present

## 2020-11-09 DIAGNOSIS — R29898 Other symptoms and signs involving the musculoskeletal system: Secondary | ICD-10-CM

## 2020-11-09 DIAGNOSIS — M6281 Muscle weakness (generalized): Secondary | ICD-10-CM

## 2020-11-09 DIAGNOSIS — R42 Dizziness and giddiness: Secondary | ICD-10-CM
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
## 2020-11-09 NOTE — Therapy (Signed)
[facility] [place] [address] [place], [place], [address] Phone: [phone]   Fax:  [phone]  Physical Therapy Treatment  Patient Details  Name: [name] MRN: [id number] Date of Birth: July 23, 1991 Referring Provider (PT): Earma Reading   Encounter Date: 11/09/2020   PT End of Session - 11/09/20 1006    Visit Number 3    Number of Visits 12    Date for PT Re-Evaluation 12/13/20    PT Start Time 0930    PT Stop Time 1000    PT Time Calculation (min) 30 min    Activity Tolerance Treatment limited secondary to medical complications (Comment)   elevated BP   Behavior During Therapy [facility] for tasks assessed/performed           Past Medical History:  Diagnosis Date  . Animal bite of finger    a cat  . Anxiety   . Asthma   . Bronchitis   . Endometriosis   . Insomnia   . PCOS (polycystic ovarian syndrome)   . Postgastrectomy malabsorption   . PTSD (post-traumatic stress disorder)   . Sleep apnea   . Sleep disorder     Past Surgical History:  Procedure Laterality Date  . CESAREAN SECTION N/A 01/20/2014   Procedure: CESAREAN SECTION;  Surgeon: Brock Bad, MD;  Location: [facility];  Service: Obstetrics;  Laterality: N/A;  . GASTRIC BYPASS    . MOUTH SURGERY    . WISDOM TOOTH EXTRACTION      There were no vitals filed for this visit.   Subjective Assessment - 11/09/20 0933    Subjective Pt states she has been more dizzy and nauseous on Sunday and Monday which has been limiting activity. Pt reports back is "getting a little better"    Patient Stated Goals decrease pain and dizziness, return to work    Currently in Pain? No/denies                   Vestibular Assessment - 11/09/20 0001      Orthostatics   BP supine (x 5 minutes) 134/87    HR supine (x 5 minutes) 78    BP sitting 141/97    HR sitting 97    BP standing (after 1 minute) 139/101    HR standing (after 1 minute) 117    BP  standing (after 3 minutes) 143/97    HR standing (after 3 minutes) 121                     Vestibular Treatment/Exercise - 11/09/20 0001      Canal Roll Right   Number of Reps  1    Response Details  pt able to tolerate x 1                      PT Long Term Goals - 11/02/20 1119      PT LONG TERM GOAL #1   Title Pt will be independent with HEP    Time 6    Period Weeks    Status New    Target Date 12/13/20      PT LONG TERM GOAL #2   Title Pt will improve FOTO to >= 64 to demo improved functional mobility    Time 6    Period Weeks    Status New    Target Date 12/13/20      PT LONG TERM GOAL #3  Title Pt will improve bilat LE strength to 4+/5 to return to work with decreased pain    Time 6    Period Weeks    Status New    Target Date 12/13/20      PT LONG TERM GOAL #4   Title Pt will tolerate standing > 30 minutes with pain <= 2/10    Time 6    Period Weeks    Status New    Target Date 12/13/20      PT LONG TERM GOAL #5   Title Pt will perform bed mobility without c/o dizziness    Time 6    Period Weeks    Status New    Target Date 12/13/20                 Plan - 11/09/20 1007    Clinical Impression Statement Pt with increased dizziness, nausea and sweating this session. Attempted vestibular treatment with minimal easing of symptoms. pt states she feels dizzy with position changes. PT assessed orthostatic vitals and found elevated BP in each position.  PT recommended pt see MD due to elevated BP along with dizziness and sweating. Pt to urgent care at [facility]    PT Next Visit Plan continue with vestibular treatment.  progress low back stretching and strengthening    PT Home Exercise Plan Access Code: [facility]           Patient will benefit from skilled therapeutic intervention in order to improve the following deficits and impairments:     Visit Diagnosis: Dizziness and giddiness  BPPV (benign paroxysmal positional  vertigo), right  Acute midline low back pain without sciatica  Difficulty in walking, not elsewhere classified  Other symptoms and signs involving the musculoskeletal system  Muscle weakness (generalized)     Problem List Patient Active Problem List   Diagnosis Date Noted  . GAD (generalized anxiety disorder) 05/19/2020  . Asthma exacerbation 04/27/2015  . Thrombocytopenia (HCC)   . Shortness of breath   . Essential hypertension   . Asthma   . Hypokalemia   . Status post cesarean section 01/20/2014  . Pregnancy 01/18/2014  . Allergic rhinitis, seasonal 12/04/2013  . Tobacco use disorder complicating pregnancy, childbirth, or the puerperium, antepartum condition or complication 07/11/2013  . Rh negative state in antepartum period 06/13/2013  . Supervision of normal pregnancy in first trimester 06/11/2013  . Obesity 06/11/2013  . Smoker 06/11/2013   Jarelyn Bambach, PT  Manal Kreutzer 11/09/2020, 10:09 AM  [facility] [address] [place], [place], [address] Phone: [phone]   Fax:  [phone]  Name: DOLA LUNSFORD MRN: [id number] Date of Birth: 04/28/1991
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
## 2020-11-09 NOTE — Discharge Instructions (Addendum)
Continue current treatment plan. 
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
## 2020-11-11 ENCOUNTER — Other Ambulatory Visit: Payer: Self-pay

## 2020-11-11 ENCOUNTER — Ambulatory Visit (INDEPENDENT_AMBULATORY_CARE_PROVIDER_SITE_OTHER): Payer: BC Managed Care – PPO | Admitting: Family Medicine

## 2020-11-11 ENCOUNTER — Encounter: Payer: Self-pay | Admitting: Family Medicine

## 2020-11-11 VITALS — BP 140/80 | HR 62 | Ht 68.0 in | Wt 179.8 lb

## 2020-11-11 DIAGNOSIS — R519 Headache, unspecified: Secondary | ICD-10-CM

## 2020-11-11 DIAGNOSIS — H538 Other visual disturbances: Secondary | ICD-10-CM

## 2020-11-11 DIAGNOSIS — S060X0D Concussion without loss of consciousness, subsequent encounter: Secondary | ICD-10-CM | POA: Diagnosis not present

## 2020-11-11 NOTE — Patient Instructions (Signed)
Thank you for coming in today.  Plan for neuro ophtho/.   Plan for neurology.   Recheck before you note runs out in April.   Let me know if things change.

## 2020-11-12 ENCOUNTER — Encounter: Payer: Self-pay | Admitting: Rehabilitative and Restorative Service Providers"

## 2020-11-12 ENCOUNTER — Other Ambulatory Visit: Payer: Self-pay

## 2020-11-12 ENCOUNTER — Encounter: Payer: Self-pay | Admitting: Neurology

## 2020-11-12 ENCOUNTER — Ambulatory Visit (INDEPENDENT_AMBULATORY_CARE_PROVIDER_SITE_OTHER): Payer: BC Managed Care – PPO | Admitting: Rehabilitative and Restorative Service Providers"

## 2020-11-12 ENCOUNTER — Telehealth: Payer: Self-pay | Admitting: Family Medicine

## 2020-11-12 DIAGNOSIS — R42 Dizziness and giddiness: Secondary | ICD-10-CM

## 2020-11-12 DIAGNOSIS — H8111 Benign paroxysmal vertigo, right ear: Secondary | ICD-10-CM

## 2020-11-12 NOTE — Telephone Encounter (Signed)
Per pt, cancel the referral to [facility] as they cannot get her in until June.  Please change referral to [facility]. She has made a tentative appt for 4/6 with Dr. Langston Masker there, but they need the referral notes. Phone [phone].

## 2020-11-12 NOTE — Therapy (Signed)
[facility] [place] [address] [place], [place], [address] Phone: [phone]   Fax:  [phone]  Physical Therapy Treatment  Patient Details  Name: [name] MRN: [id number] Date of Birth: 1991-06-14 Referring Provider (PT): Earma Reading   Encounter Date: 11/12/2020   PT End of Session - 11/12/20 0933    Visit Number 4    Number of Visits 12    Date for PT Re-Evaluation 12/13/20    PT Start Time 0929    PT Stop Time 1014    PT Time Calculation (min) 45 min    Activity Tolerance Treatment limited secondary to medical complications (Comment)   elevated BP   Behavior During Therapy [facility] for tasks assessed/performed           Past Medical History:  Diagnosis Date  . Animal bite of finger    a cat  . Anxiety   . Asthma   . Bronchitis   . Endometriosis   . Insomnia   . PCOS (polycystic ovarian syndrome)   . Postgastrectomy malabsorption   . PTSD (post-traumatic stress disorder)   . Sleep apnea   . Sleep disorder     Past Surgical History:  Procedure Laterality Date  . CESAREAN SECTION N/A 01/20/2014   Procedure: CESAREAN SECTION;  Surgeon: Brock Bad, MD;  Location: [facility];  Service: Obstetrics;  Laterality: N/A;  . GASTRIC BYPASS    . MOUTH SURGERY    . WISDOM TOOTH EXTRACTION      There were no vitals filed for this visit.   Subjective Assessment - 11/12/20 0926    Subjective The patient reports her neck is much better.  She continues with dizziness and low back pain.  Back pain is worse with home activities.  Headache does come and go now and feels less like a migraine, more like a HA.    Patient Stated Goals decrease pain and dizziness, return to work    Currently in Pain? Yes    Pain Score 3     Pain Location Head    Pain Descriptors / Indicators Headache    Pain Type Acute pain    Pain Onset More than a month ago    Pain Frequency Constant    Aggravating Factors  got a migraine trying to  play chess yesterday, phone use, eye movement    Pain Relieving Factors first thing in the morning.              [facility] PT Assessment - 11/12/20 0932      Assessment   Medical Diagnosis lumbar radiculopathy, neck pain    Referring Provider (PT) Earma Reading    Onset Date/Surgical Date 10/15/20               Vestibular Assessment - 11/12/20 1009      Positional Testing   Sidelying Test Sidelying Right;Sidelying Left      Sidelying Right   Sidelying Right Duration constant dizziness in this position of 5/10    Sidelying Right Symptoms No nystagmus      Sidelying Left   Sidelying Left Duration none moving into L sidelying, 2/10 returning to sitting    Sidelying Left Symptoms No nystagmus                    [facility] Adult PT Treatment/Exercise - 11/12/20 0944      Neuro Re-ed    Neuro Re-ed Details  Habituation for visual flow and dizziness  in standing:  Working on standing with horiozntal head motion, doing a ball pass R<>L x 3 reps to each side, standing on foam with eyes open and then eyes closed.      Exercises   Exercises Lumbar      Lumbar Exercises: Standing   Functional Squats 5 reps    Functional Squats Limitations squat to chair for posterior weight translation> patient has dec'd firing of gluts and gets back pain-- switched to wall slide    Wall Slides 10 reps           Vestibular Treatment/Exercise - 11/12/20 0001      Vestibular Treatment/Exercise   Vestibular Treatment Provided Habituation;Gaze    Habituation Exercises Seated Horizontal Head Turns;Seated Vertical Head Turns;Standing Horizontal Head Turns;Standing Vertical Head Turns;Brandt Daroff    Gaze Exercises Eye/Head Exercise Horizontal      Austin Miles   Number of Reps  2    Symptom Description  4/10 dizziness with R sidelying;      Seated Horizontal Head Turns   Number of Reps  3    Symptom Description  5/10 worse to the R side      Seated Vertical Head Turns   Number of  Reps  5    Symptom Description  2/10      Eye/Head Exercise Horizontal   Foot Position seated    Reps 6    Comments Eye/head coordination moving eyes then head to target; dizziness and nausea 2-3/10; dizziness settles quickly, nausea remains; Smooth pursuits horizontal in seated-- then brought smooth pursuit activity into functional tasks.                 PT Education - 11/12/20 1013    Education Details HEP    Person(s) Educated Patient    Methods Explanation;Demonstration;Handout    Comprehension Verbalized understanding;Returned demonstration               PT Long Term Goals - 11/02/20 1119      PT LONG TERM GOAL #1   Title Pt will be independent with HEP    Time 6    Period Weeks    Status New    Target Date 12/13/20      PT LONG TERM GOAL #2   Title Pt will improve FOTO to >= 64 to demo improved functional mobility    Time 6    Period Weeks    Status New    Target Date 12/13/20      PT LONG TERM GOAL #3   Title Pt will improve bilat LE strength to 4+/5 to return to work with decreased pain    Time 6    Period Weeks    Status New    Target Date 12/13/20      PT LONG TERM GOAL #4   Title Pt will tolerate standing > 30 minutes with pain <= 2/10    Time 6    Period Weeks    Status New    Target Date 12/13/20      PT LONG TERM GOAL #5   Title Pt will perform bed mobility without c/o dizziness    Time 6    Period Weeks    Status New    Target Date 12/13/20                  Patient will benefit from skilled therapeutic intervention in order to improve the following deficits and impairments:     Visit Diagnosis:  Dizziness and giddiness  BPPV (benign paroxysmal positional vertigo), right     Problem List Patient Active Problem List   Diagnosis Date Noted  . GAD (generalized anxiety disorder) 05/19/2020  . Asthma exacerbation 04/27/2015  . Thrombocytopenia (HCC)   . Shortness of breath   . Essential hypertension   . Asthma    . Hypokalemia   . Status post cesarean section 01/20/2014  . Pregnancy 01/18/2014  . Allergic rhinitis, seasonal 12/04/2013  . Tobacco use disorder complicating pregnancy, childbirth, or the puerperium, antepartum condition or complication 07/11/2013  . Rh negative state in antepartum period 06/13/2013  . Supervision of normal pregnancy in first trimester 06/11/2013  . Obesity 06/11/2013  . Smoker 06/11/2013    Iyona Pehrson , PT 11/12/2020, 12:59 PM  [facility] [address] [place], [place], [address] Phone: [phone]   Fax:  [phone]  Name: KATTLEYA KUHNERT MRN: [id number] Date of Birth: 1991-01-18

## 2020-11-12 NOTE — Addendum Note (Signed)
Addended by: Debbe Odea R on: 11/12/2020 11:13 AM   Modules accepted: Orders

## 2020-11-12 NOTE — Telephone Encounter (Signed)
Faxed to [facility] in [place]

## 2020-11-12 NOTE — Patient Instructions (Signed)
Access Code: [facility] URL: [email or web address] Date: 11/12/2020 Prepared by: Margretta Ditty  Program Notes CONVERGENCE:  hold the target and move it closer to your nose until it doubles.  Hold for 5 seconds, then move it away from your nose.  Repeat 5 times.    Exercises Supine Lower Trunk Rotation - 1 x daily - 7 x weekly - 3 sets - 10 reps Hooklying Single Knee to Chest Stretch - 1 x daily - 7 x weekly - 3 sets - 1 reps Hooklying Transversus Abdominis Palpation - 1 x daily - 7 x weekly - 1 sets - 10 reps - 3-5 seconds hold Wall Squat - 2 x daily - 7 x weekly - 1 sets - 10 reps Seated Gaze Stabilization with Head Rotation - 2 x daily - 7 x weekly - 1 sets - 10 reps Seated Gaze Stabilization with Head Nod - 2 x daily - 7 x weekly - 1 sets - 10 reps Seated Left Head Turns Vestibular Habituation - 2 x daily - 7 x weekly - 1 sets - 5 reps Standing Gaze Stabilization with Two Near Targets and Head Rotation - 2 x daily - 7 x weekly - 1 sets - 10 reps

## 2020-11-16 ENCOUNTER — Other Ambulatory Visit: Payer: Self-pay

## 2020-11-16 ENCOUNTER — Ambulatory Visit (INDEPENDENT_AMBULATORY_CARE_PROVIDER_SITE_OTHER): Payer: BC Managed Care – PPO | Admitting: Physical Therapy

## 2020-11-16 DIAGNOSIS — M545 Low back pain, unspecified: Secondary | ICD-10-CM | POA: Diagnosis not present

## 2020-11-16 DIAGNOSIS — H8111 Benign paroxysmal vertigo, right ear: Secondary | ICD-10-CM | POA: Diagnosis not present

## 2020-11-16 DIAGNOSIS — R42 Dizziness and giddiness: Secondary | ICD-10-CM | POA: Diagnosis not present

## 2020-11-16 DIAGNOSIS — R262 Difficulty in walking, not elsewhere classified: Secondary | ICD-10-CM | POA: Diagnosis not present

## 2020-11-16 DIAGNOSIS — M6281 Muscle weakness (generalized): Secondary | ICD-10-CM

## 2020-11-16 DIAGNOSIS — R29898 Other symptoms and signs involving the musculoskeletal system: Secondary | ICD-10-CM

## 2020-11-16 NOTE — Therapy (Signed)
[facility] [place] [address] [place], [place], [address] Phone: [phone]   Fax:  [phone]  Physical Therapy Treatment  Patient Details  Name: RAYLA PEMBER MRN: [id number] Date of Birth: 17-May-1991 Referring Provider (PT): Earma Reading   Encounter Date: 11/16/2020   PT End of Session - 11/16/20 1010    Visit Number 5    Number of Visits 12    Date for PT Re-Evaluation 12/13/20    PT Start Time 0932    PT Stop Time 1015    PT Time Calculation (min) 43 min    Behavior During Therapy [facility] for tasks assessed/performed           Past Medical History:  Diagnosis Date  . Animal bite of finger    a cat  . Anxiety   . Asthma   . Bronchitis   . Endometriosis   . Insomnia   . PCOS (polycystic ovarian syndrome)   . Postgastrectomy malabsorption   . PTSD (post-traumatic stress disorder)   . Sleep apnea   . Sleep disorder     Past Surgical History:  Procedure Laterality Date  . CESAREAN SECTION N/A 01/20/2014   Procedure: CESAREAN SECTION;  Surgeon: Brock Bad, MD;  Location: [facility];  Service: Obstetrics;  Laterality: N/A;  . GASTRIC BYPASS    . MOUTH SURGERY    . WISDOM TOOTH EXTRACTION      There were no vitals filed for this visit.   Subjective Assessment - 11/16/20 0933    Subjective "I am having dizziness here and there but it's not horrible"  Pt states she has been performing vestibular HEP but is still having nausea with it and is unable to complete full program due to symptoms. Pt states her back is feeling a little better but it still gets "tweaked" easily    Patient Stated Goals decrease pain and dizziness, return to work    Currently in Pain? Yes    Pain Score 3     Pain Location Head    Pain Descriptors / Indicators Headache                             [facility] Adult PT Treatment/Exercise - 11/16/20 0001      Lumbar Exercises: Stretches   Single Knee to Chest Stretch 3  reps;20 seconds    Lower Trunk Rotation Limitations 2 x 10 pain free range      Lumbar Exercises: Standing   Wall Slides 15 reps    Wall Slides Limitations 3 sec hold    Row Strengthening;20 reps    Theraband Level (Row) Level 2 (Red)      Lumbar Exercises: Seated   Other Seated Lumbar Exercises horizontal abduction red TB 2 x 10 cues for posture      Lumbar Exercises: Supine   Ab Set 5 reps;5 seconds    Heel Slides 10 reps   with ab set                      PT Long Term Goals - 11/02/20 1119      PT LONG TERM GOAL #1   Title Pt will be independent with HEP    Time 6    Period Weeks    Status New    Target Date 12/13/20      PT LONG TERM GOAL #2   Title Pt will improve FOTO  to >= 64 to demo improved functional mobility    Time 6    Period Weeks    Status New    Target Date 12/13/20      PT LONG TERM GOAL #3   Title Pt will improve bilat LE strength to 4+/5 to return to work with decreased pain    Time 6    Period Weeks    Status New    Target Date 12/13/20      PT LONG TERM GOAL #4   Title Pt will tolerate standing > 30 minutes with pain <= 2/10    Time 6    Period Weeks    Status New    Target Date 12/13/20      PT LONG TERM GOAL #5   Title Pt will perform bed mobility without c/o dizziness    Time 6    Period Weeks    Status New    Target Date 12/13/20                 Plan - 11/16/20 1011    Clinical Impression Statement Session focused on back pain today as pt states her dizziness is improving. Pt able to progress postural strengthening and core strengthening with no increase in pain. Pt continues to require cues for eye/head movement with transitions supine <> sit and sit <> stand    PT Next Visit Plan continue with vestibular treatment.  progress low back stretching and strengthening    PT Home Exercise Plan Access Code: [facility]    Consulted and Agree with Plan of Care Patient           Patient will benefit from skilled  therapeutic intervention in order to improve the following deficits and impairments:     Visit Diagnosis: Dizziness and giddiness  BPPV (benign paroxysmal positional vertigo), right  Acute midline low back pain without sciatica  Difficulty in walking, not elsewhere classified  Other symptoms and signs involving the musculoskeletal system  Muscle weakness (generalized)     Problem List Patient Active Problem List   Diagnosis Date Noted  . GAD (generalized anxiety disorder) 05/19/2020  . Asthma exacerbation 04/27/2015  . Thrombocytopenia (HCC)   . Shortness of breath   . Essential hypertension   . Asthma   . Hypokalemia   . Status post cesarean section 01/20/2014  . Pregnancy 01/18/2014  . Allergic rhinitis, seasonal 12/04/2013  . Tobacco use disorder complicating pregnancy, childbirth, or the puerperium, antepartum condition or complication 07/11/2013  . Rh negative state in antepartum period 06/13/2013  . Supervision of normal pregnancy in first trimester 06/11/2013  . Obesity 06/11/2013  . Smoker 06/11/2013   Jazmon Kos, PT  Aili Casillas 11/16/2020, 10:13 AM  [facility] [address] [place], [place], [address] Phone: [phone]   Fax:  [phone]  Name: TIMMYA BLAZIER MRN: [id number] Date of Birth: 04-20-1991

## 2020-11-19 ENCOUNTER — Ambulatory Visit (INDEPENDENT_AMBULATORY_CARE_PROVIDER_SITE_OTHER): Payer: BC Managed Care – PPO | Admitting: Physical Therapy

## 2020-11-19 ENCOUNTER — Other Ambulatory Visit: Payer: Self-pay

## 2020-11-19 ENCOUNTER — Encounter: Payer: Self-pay | Admitting: Physical Therapy

## 2020-11-19 DIAGNOSIS — M545 Low back pain, unspecified: Secondary | ICD-10-CM

## 2020-11-19 DIAGNOSIS — R29898 Other symptoms and signs involving the musculoskeletal system: Secondary | ICD-10-CM | POA: Diagnosis not present

## 2020-11-19 DIAGNOSIS — M6281 Muscle weakness (generalized): Secondary | ICD-10-CM

## 2020-11-19 DIAGNOSIS — R42 Dizziness and giddiness: Secondary | ICD-10-CM

## 2020-11-19 NOTE — Therapy (Signed)
[place], [place], [address] Phone: [phone]   Fax:  [phone]  Physical Therapy Treatment  Patient Details  Name: [name] MRN: [id number] Date of Birth: [birth year] Referring Provider (PT): Sherene Sires   Encounter Date: 11/19/2020   PT End of Session - 11/19/20 0923    Visit Number 6    Number of Visits 12    Date for PT Re-Evaluation 12/13/20    PT Start Time 0925    PT Stop Time 1013    PT Time Calculation (min) 48 min    Behavior During Therapy [facility] for tasks assessed/performed           Past Medical History:  Diagnosis Date  . Animal bite of finger    a cat  . Anxiety   . Asthma   . Bronchitis   . Endometriosis   . Insomnia   . PCOS (polycystic ovarian syndrome)   . Postgastrectomy malabsorption   . PTSD (post-traumatic stress disorder)   . Sleep apnea   . Sleep disorder     Past Surgical History:  Procedure Laterality Date  . CESAREAN SECTION N/A 01/20/2014   Procedure: CESAREAN SECTION;  Surgeon: Shelly Bombard, MD;  Location: [facility];  Service: Obstetrics;  Laterality: N/A;  . GASTRIC BYPASS    . MOUTH SURGERY    . WISDOM TOOTH EXTRACTION      There were no vitals filed for this visit.   Subjective Assessment - 11/19/20 0926    Subjective Renato Gails was a bad day, spent it in bad.  She's unsure if things are getting better.  Has a follow up with Neurology next week.  Continued nausea with looking / tracking to the Rt, or Rt sidelying. Continued difficulty with vision in Rt eye and with short term memory.    Patient Stated Goals decrease pain and dizziness, return to work    Currently in Pain? No/denies    Pain Score 0-No pain              [facility] PT Assessment - 11/19/20 0001      Assessment   Medical Diagnosis lumbar radiculopathy, neck pain    Referring Provider (PT) Sherene Sires    Onset Date/Surgical Date 10/15/20            [facility] Adult PT  Treatment/Exercise - 11/19/20 0001      Self-Care   Self-Care Posture    Posture Pt issued posture and body mechanics handout.  Educated pt regarding avoiding prolonged slouched position (C sitting) and instead having lumbar support.      Lumbar Exercises: Stretches   Sports administrator Right;Left;1 rep;20 seconds   seated, foot under surface   Piriformis Stretch Right;Left;1 rep;20 seconds   seated     Lumbar Exercises: Aerobic   Nustep L4: legs only x 5 min      Lumbar Exercises: Standing   Wall Slides 10 reps;3 seconds;5 seconds   holding 3-5# wt in/out from core     Lumbar Exercises: Seated   Sit to Stand 10 reps   staggered stance, Rt leg back.   Sit to Stand Limitations cues to control descent, arms forward.    Other Seated Lumbar Exercises ab set with hand press into therapists hands x 5 sec x 5 reps      Lumbar Exercises: Prone   Opposite Arm/Leg Raise Right arm/Left leg;Left arm/Right leg;5 reps    Opposite Arm/Leg Raise Limitations Rt hip  ext very challenging      Lumbar Exercises: Quadruped   Madcat/Old Horse --   3 reps   Opposite Arm/Leg Raise Right arm/Left leg;Left arm/Right leg;5 reps;2 seconds    Other Quadruped Lumbar Exercises fire hydrant and hip extension (knee bent) alternating motions x 10 each leg.    Other Quadruped Lumbar Exercises childs pose                       PT Long Term Goals - 11/19/20 0931      PT LONG TERM GOAL #1   Title Pt will be independent with HEP    Time 6    Period Weeks    Status On-going      PT LONG TERM GOAL #2   Title Pt will improve FOTO to >= 64 to demo improved functional mobility    Time 6    Period Weeks    Status On-going      PT LONG TERM GOAL #3   Title Pt will improve bilat LE strength to 4+/5 to return to work with decreased pain    Time 6    Period Weeks    Status On-going      PT LONG TERM GOAL #4   Title Pt will tolerate standing > 30 minutes with pain <= 2/10    Baseline pt reports  compensation, shifting wt to her LLE    Time 6    Period Weeks    Status Partially Met      PT LONG TERM GOAL #5   Title Pt will perform bed mobility without c/o dizziness    Time 6    Period Weeks    Status On-going                 Plan - 11/19/20 1035    Clinical Impression Statement Session focused on LB and LE strengthening.  Pt had difficulty with balance in quadruped with Rt hip lifitng, and weakness in Rt hip with prone hip ext. Pt given cues to keep focus straight ahead instead of down watching LEs and for increased weight into RLE in standing.  Pt complains of continued cognitive difficulties with work-related tasks (preparing at home for return to work); may want to inquire with neurologist next wk regarding referral for Speech therapist. Pt has partially met LTG#4.   Rehab Potential Good    PT Frequency 2x / week    PT Duration 6 weeks    PT Treatment/Interventions Taping;Dry needling;Passive range of motion;Manual techniques;Patient/family education;Therapeutic activities;Therapeutic exercise;Balance training;Neuromuscular re-education;Functional mobility training;Electrical Stimulation;Iontophoresis 74m/ml Dexamethasone;Moist Heat;Cryotherapy;Canalith Repostioning;Aquatic Therapy;Vestibular    PT Next Visit Plan continue with vestibular treatment.  progress low back stretching and strengthening    PT Home Exercise Plan Access Code: [facility]   Consulted and Agree with Plan of Care Patient           Patient will benefit from skilled therapeutic intervention in order to improve the following deficits and impairments:  Decreased balance,Decreased mobility,Difficulty walking,Increased muscle spasms,Dizziness,Decreased range of motion,Decreased activity tolerance,Decreased strength,Pain  Visit Diagnosis: Acute midline low back pain without sciatica  Muscle weakness (generalized)  Other symptoms and signs involving the musculoskeletal system  Dizziness and  giddiness     Problem List Patient Active Problem List   Diagnosis Date Noted  . GAD (generalized anxiety disorder) 05/19/2020  . Asthma exacerbation 04/27/2015  . Thrombocytopenia ([place]   . Shortness of breath   . Essential hypertension   .  Asthma   . Hypokalemia   . Status post cesarean section 01/20/2014  . Pregnancy 01/18/2014  . Allergic rhinitis, seasonal 12/04/2013  . Tobacco use disorder complicating pregnancy, childbirth, or the puerperium, antepartum condition or complication 57/08/7791  . Rh negative state in antepartum period 06/13/2013  . Supervision of normal pregnancy in first trimester 06/11/2013  . Obesity 06/11/2013  . Smoker 06/11/2013   Kerin Perna, PTA 11/19/20 11:29 AM  [place], [place], [address] Phone: [phone]   Fax:  [phone]  Name: KEYASIA JOLLIFF MRN: [id number] Date of Birth: [birth year]

## 2020-11-19 NOTE — Patient Instructions (Addendum)
Sleeping on Back  Place pillow under knees. A pillow with cervical support and a roll around waist are also helpful. Copyright  VHI. All rights reserved.  Sleeping on Side Place pillow between knees. Use cervical support under neck and a roll around waist as needed. Copyright  VHI. All rights reserved.   Sleeping on Stomach   If this is the only desirable sleeping position, place pillow under lower legs, and under stomach or chest as needed.  Posture - Sitting   Sit upright, head facing forward. Try using a roll to support lower back. Keep shoulders relaxed, and avoid rounded back. Keep hips level with knees. Avoid crossing legs for long periods. Stand to Sit / Sit to Stand   To sit: Bend knees to lower self onto front edge of chair, then scoot back on seat. To stand: Reverse sequence by placing one foot forward, and scoot to front of seat. Use rocking motion to stand up.   Work Height and Reach  Ideal work height is no more than 2 to 4 inches below elbow level when standing, and at elbow level when sitting. Reaching should be limited to arm's length, with elbows slightly bent.  Bending  Bend at hips and knees, not back. Keep feet shoulder-width apart.    Posture - Standing   Good posture is important. Avoid slouching and forward head thrust. Maintain curve in low back and align ears over shoul- ders, hips over ankles.  Alternating Positions   Alternate tasks and change positions frequently to reduce fatigue and muscle tension. Take rest breaks. Computer Work   Position work to face forward. Use proper work and seat height. Keep shoulders back and down, wrists straight, and elbows at right angles. Use chair that provides full back support. Add footrest and lumbar roll as needed.  Getting Into / Out of Car  Lower self onto seat, scoot back, then bring in one leg at a time. Reverse sequence to get out.  Dressing  Lie on back to pull socks or slacks over feet, or sit  and bend leg while keeping back straight.    Housework - Sink  Place one foot on ledge of cabinet under sink when standing at sink for prolonged periods.   Pushing / Pulling  Pushing is preferable to pulling. Keep back in proper alignment, and use leg muscles to do the work.  Deep Squat   Squat and lift with both arms held against upper trunk. Tighten stomach muscles without holding breath. Use smooth movements to avoid jerking.  Avoid Twisting   Avoid twisting or bending back. Pivot around using foot movements, and bend at knees if needed when reaching for articles.  Carrying Luggage   Distribute weight evenly on both sides. Use a cart whenever possible. Do not twist trunk. Move body as a unit.   Lifting Principles .Maintain proper posture and head alignment. .Slide object as close as possible before lifting. .Move obstacles out of the way. .Test before lifting; ask for help if too heavy. .Tighten stomach muscles without holding breath. .Use smooth movements; do not jerk. .Use legs to do the work, and pivot with feet. .Distribute the work load symmetrically and close to the center of trunk. .Push instead of pull whenever possible.   Ask For Help   Ask for help and delegate to others when possible. Coordinate your movements when lifting together, and maintain the low back curve.  Log Roll   Lying on back, bend left knee and place left   arm across chest. Roll all in one movement to the right. Reverse to roll to the left. Always move as one unit. Housework - Sweeping  Use long-handled equipment to avoid stooping.   Housework - Wiping  Position yourself as close as possible to reach work surface. Avoid straining your back.  Laundry - Unloading Wash   To unload small items at bottom of washer, lift leg opposite to arm being used to reach.  Gardening - Raking  Move close to area to be raked. Use arm movements to do the work. Keep back straight and avoid  twisting.     Cart  When reaching into cart with one arm, lift opposite leg to keep back straight.   Getting Into / Out of Bed  Lower self to lie down on one side by raising legs and lowering head at the same time. Use arms to assist moving without twisting. Bend both knees to roll onto back if desired. To sit up, start from lying on side, and use same move-ments in reverse. Housework - Vacuuming  Hold the vacuum with arm held at side. Step back and forth to move it, keeping head up. Avoid twisting.   Laundry - Armed forces training and education officer so that bending and twisting can be avoided.   Laundry - Unloading Dryer  Squat down to reach into clothes dryer or use a reacher.  Gardening - Weeding / Psychiatric nurse or Kneel. Knee pads may be helpful.                   Access Code: [facility] URL: [email or web address] Date: 11/19/2020 Prepared by: [facility] [place]  Program Notes CONVERGENCE:  hold the target and move it closer to your nose until it doubles.  Hold for 5 seconds, then move it away from your nose.  Repeat 5 times.    Exercises Supine Lower Trunk Rotation - 1 x daily - 7 x weekly - 3 sets - 10 reps Hooklying Single Knee to Chest Stretch - 1 x daily - 7 x weekly - 3 sets - 1 reps Seated Gaze Stabilization with Head Rotation - 2 x daily - 7 x weekly - 1 sets - 10 reps Seated Gaze Stabilization with Head Nod - 2 x daily - 7 x weekly - 1 sets - 10 reps Seated Left Head Turns Vestibular Habituation - 2 x daily - 7 x weekly - 1 sets - 5 reps Standing Gaze Stabilization with Two Near Targets and Head Rotation - 2 x daily - 7 x weekly - 1 sets - 10 reps Standing Shoulder Horizontal Abduction with Resistance - 1 x daily - 7 x weekly - 3 sets - 10 reps Standing Bilateral Low Shoulder Row with Anchored Resistance - 1 x daily - 7 x weekly - 3 sets - 10 reps Seated Transversus Abdominis Bracing - 1 x daily - 7 x weekly - 1  sets - 10 reps - 5 sec hold Wall Squat - 1 x daily - 3 x weekly - 1 sets - 10 reps Staggered Stance Squat - 1 x daily - 3 x weekly - 2 sets - 5 reps Prone Hip Extension - 1 x daily - 3 x weekly - 1-2 sets - 10 reps Bird Dog - 1 x daily - 3 x weekly - 1 sets - 10 reps

## 2020-11-22 DIAGNOSIS — Z9884 Bariatric surgery status: Secondary | ICD-10-CM | POA: Diagnosis not present

## 2020-11-22 DIAGNOSIS — K912 Postsurgical malabsorption, not elsewhere classified: Secondary | ICD-10-CM | POA: Diagnosis not present

## 2020-11-23 ENCOUNTER — Other Ambulatory Visit: Payer: Self-pay

## 2020-11-23 ENCOUNTER — Ambulatory Visit (INDEPENDENT_AMBULATORY_CARE_PROVIDER_SITE_OTHER): Payer: BC Managed Care – PPO | Admitting: Physical Therapy

## 2020-11-23 DIAGNOSIS — M6281 Muscle weakness (generalized): Secondary | ICD-10-CM | POA: Diagnosis not present

## 2020-11-23 DIAGNOSIS — M545 Low back pain, unspecified: Secondary | ICD-10-CM

## 2020-11-23 DIAGNOSIS — R29898 Other symptoms and signs involving the musculoskeletal system: Secondary | ICD-10-CM

## 2020-11-23 DIAGNOSIS — H8111 Benign paroxysmal vertigo, right ear: Secondary | ICD-10-CM

## 2020-11-23 DIAGNOSIS — R42 Dizziness and giddiness: Secondary | ICD-10-CM | POA: Diagnosis not present

## 2020-11-23 DIAGNOSIS — R262 Difficulty in walking, not elsewhere classified: Secondary | ICD-10-CM

## 2020-11-23 NOTE — Therapy (Signed)
[place], [place], [address] Phone: [phone]   Fax:  [phone]  Physical Therapy Treatment  Patient Details  Name: [name] MRN: [id number] Date of Birth: 24-Mar-1991 Referring Provider (PT): Sherene Sires   Encounter Date: 11/23/2020   PT End of Session - 11/23/20 1012    Visit Number 7    Number of Visits 12    Date for PT Re-Evaluation 12/13/20    PT Start Time 0930    PT Stop Time 1015    PT Time Calculation (min) 45 min    Activity Tolerance Patient tolerated treatment well    Behavior During Therapy [facility] for tasks assessed/performed           Past Medical History:  Diagnosis Date  . Animal bite of finger    a cat  . Anxiety   . Asthma   . Bronchitis   . Endometriosis   . Insomnia   . PCOS (polycystic ovarian syndrome)   . Postgastrectomy malabsorption   . PTSD (post-traumatic stress disorder)   . Sleep apnea   . Sleep disorder     Past Surgical History:  Procedure Laterality Date  . CESAREAN SECTION N/A 01/20/2014   Procedure: CESAREAN SECTION;  Surgeon: Shelly Bombard, MD;  Location: [facility];  Service: Obstetrics;  Laterality: N/A;  . GASTRIC BYPASS    . MOUTH SURGERY    . WISDOM TOOTH EXTRACTION      There were no vitals filed for this visit.   Subjective Assessment - 11/23/20 0926    Subjective Pt states she feels better and is not as sensitive to light. Still challeneged by vestibular exercises, wants to improve low back pain    Currently in Pain? No/denies                             [facility] Adult PT Treatment/Exercise - 11/23/20 0001      Lumbar Exercises: Stretches   Quad Stretch Right;Left;30 seconds    Piriformis Stretch Right;Left;30 seconds    Other Lumbar Stretch Exercise childs pose x 30sec x 2      Lumbar Exercises: Aerobic   Nustep L5 x 4 minutes for warm up      Lumbar Exercises: Standing   Row Strengthening;20 reps     Theraband Level (Row) Level 2 (Red)    Shoulder Extension Strengthening;20 reps    Theraband Level (Shoulder Extension) Level 2 (Red)    Other Standing Lumbar Exercises chops and lifts red TB x 20 bilat      Lumbar Exercises: Supine   Dead Bug 10 reps      Lumbar Exercises: Prone   Opposite Arm/Leg Raise Right arm/Left leg;Left arm/Right leg;5 reps      Lumbar Exercises: Quadruped   Madcat/Old Horse 5 reps    Opposite Arm/Leg Raise Right arm/Left leg;Left arm/Right leg;5 reps                       PT Long Term Goals - 11/19/20 0931      PT LONG TERM GOAL #1   Title Pt will be independent with HEP    Time 6    Period Weeks    Status On-going      PT LONG TERM GOAL #2   Title Pt will improve FOTO to >= 64 to demo improved functional mobility    Time 6  Period Weeks    Status On-going      PT LONG TERM GOAL #3   Title Pt will improve bilat LE strength to 4+/5 to return to work with decreased pain    Time 6    Period Weeks    Status On-going      PT LONG TERM GOAL #4   Title Pt will tolerate standing > 30 minutes with pain <= 2/10    Baseline pt reports compensation, shifting wt to her LLE    Time 6    Period Weeks    Status Partially Met      PT LONG TERM GOAL #5   Title Pt will perform bed mobility without c/o dizziness    Time 6    Period Weeks    Status On-going                 Plan - 11/23/20 1013    Clinical Impression Statement Pt with much improved tolerance to light and position changes this session. Pt improving core and back strength and is reducing pain and improving flexibility. Limited by decreased balance and vestibular systems    PT Next Visit Plan progress balance exercises, vestibular as needed    PT Home Exercise Plan Access Code: [facility]    Consulted and Agree with Plan of Care Patient           Patient will benefit from skilled therapeutic intervention in order to improve the following deficits and impairments:      Visit Diagnosis: Acute midline low back pain without sciatica  Muscle weakness (generalized)  Other symptoms and signs involving the musculoskeletal system  Dizziness and giddiness  BPPV (benign paroxysmal positional vertigo), right  Difficulty in walking, not elsewhere classified     Problem List Patient Active Problem List   Diagnosis Date Noted  . GAD (generalized anxiety disorder) 05/19/2020  . Asthma exacerbation 04/27/2015  . Thrombocytopenia ([place])   . Shortness of breath   . Essential hypertension   . Asthma   . Hypokalemia   . Status post cesarean section 01/20/2014  . Pregnancy 01/18/2014  . Allergic rhinitis, seasonal 12/04/2013  . Tobacco use disorder complicating pregnancy, childbirth, or the puerperium, antepartum condition or complication 32/99/2426  . Rh negative state in antepartum period 06/13/2013  . Supervision of normal pregnancy in first trimester 06/11/2013  . Obesity 06/11/2013  . Smoker 06/11/2013   [name], PT  [name] 11/23/2020, 10:14 AM  [facility] [place], [place], [address] Phone: [phone]   Fax:  [phone]  Name: [name] MRN: [id number] Date of Birth: Feb 27, 1991

## 2020-11-24 DIAGNOSIS — R200 Anesthesia of skin: Secondary | ICD-10-CM | POA: Diagnosis not present

## 2020-11-24 DIAGNOSIS — F0781 Postconcussional syndrome: Secondary | ICD-10-CM | POA: Diagnosis not present

## 2020-11-24 DIAGNOSIS — R4189 Other symptoms and signs involving cognitive functions and awareness: Secondary | ICD-10-CM | POA: Diagnosis not present

## 2020-11-26 ENCOUNTER — Other Ambulatory Visit: Payer: Self-pay

## 2020-11-26 ENCOUNTER — Ambulatory Visit (INDEPENDENT_AMBULATORY_CARE_PROVIDER_SITE_OTHER): Payer: BC Managed Care – PPO | Admitting: Physical Therapy

## 2020-11-26 ENCOUNTER — Encounter: Payer: Self-pay | Admitting: Physical Therapy

## 2020-11-26 DIAGNOSIS — M545 Low back pain, unspecified: Secondary | ICD-10-CM | POA: Diagnosis not present

## 2020-11-26 DIAGNOSIS — R29898 Other symptoms and signs involving the musculoskeletal system: Secondary | ICD-10-CM | POA: Diagnosis not present

## 2020-11-26 DIAGNOSIS — M6281 Muscle weakness (generalized): Secondary | ICD-10-CM

## 2020-11-26 DIAGNOSIS — R42 Dizziness and giddiness: Secondary | ICD-10-CM | POA: Diagnosis not present

## 2020-11-26 NOTE — Therapy (Signed)
[place], [place], [address] Phone: [phone]   Fax:  [phone]  Physical Therapy Treatment  Patient Details  Name: [name] MRN: [id number] Date of Birth: [birth year] Referring Provider (PT): Sherene Sires   Encounter Date: 11/26/2020   PT End of Session - 11/26/20 0935    Visit Number 8    Number of Visits 12    Date for PT Re-Evaluation 12/13/20    PT Start Time 0934    PT Stop Time 1013    PT Time Calculation (min) 39 min    Activity Tolerance Patient tolerated treatment well    Behavior During Therapy [facility] for tasks assessed/performed           Past Medical History:  Diagnosis Date  . Animal bite of finger    a cat  . Anxiety   . Asthma   . Bronchitis   . Endometriosis   . Insomnia   . PCOS (polycystic ovarian syndrome)   . Postgastrectomy malabsorption   . PTSD (post-traumatic stress disorder)   . Sleep apnea   . Sleep disorder     Past Surgical History:  Procedure Laterality Date  . CESAREAN SECTION N/A 01/20/2014   Procedure: CESAREAN SECTION;  Surgeon: Shelly Bombard, MD;  Location: [facility];  Service: Obstetrics;  Laterality: N/A;  . GASTRIC BYPASS    . MOUTH SURGERY    . WISDOM TOOTH EXTRACTION      There were no vitals filed for this visit.   Subjective Assessment - 11/26/20 0936    Subjective She has less light sensitivity, not requiring sunglasses with florescents.  Pt reports she saw Neurologist.  He believes dizziness is vestibular and is keeping her out of work until May 27.  She has been working on [employer]; may have sat wrong and that's why her back is bothering her today.    Currently in Pain? Yes    Pain Score 2     Pain Location Back    Pain Orientation Mid;Lower    Pain Descriptors / Indicators Aching              [facility] PT Assessment - 11/26/20 0001      Assessment   Medical Diagnosis lumbar radiculopathy, neck pain     Referring Provider (PT) Sherene Sires    Onset Date/Surgical Date 10/15/20            [facility] Adult PT Treatment/Exercise - 11/26/20 0001      Lumbar Exercises: Stretches   Other Lumbar Stretch Exercise thoracic ext over table, hands supporting head for back stretch.    Other Lumbar Stretch Exercise sidelying (Rt/Lt) open book with hand behind head 1 rep, then straight arm x 1 (eyes following hand slowly)      Lumbar Exercises: Aerobic   Elliptical L1: 1.5 min. stopped due to Rt knee buckling.           Balance Exercises - 11/26/20 0001      Balance Exercises: Standing   Standing Eyes Opened Narrow base of support (BOS);2 reps    Standing Eyes Closed Wide (BOA);Solid surface;1 rep;10 secs    Tandem Stance Eyes open;Intermittent upper extremity support;2 reps;15 secs   staggered with Rt leg back   SLS Eyes open;Solid surface;Intermittent upper extremity support;Upper extremity support 1;3 reps;Limitations    SLS Limitations Rt knee buckles with/without support >5 sec.    Wall Bumps Hip;Eyes opened;10 reps;Limitations  Wall Bumps Limitations leads with Lt hip; fatigues quickly.    Retro Gait 4 reps   8 ft, eyes on single point (cone on window ledge)   Sidestepping 2 reps;Limitations    Sidestepping Limitations difficulty with accepting wt into Rt leg with Rt side stepping.    Other Standing Exercises eye coordination with eye and head turns towards cones to Rt/Lt                  PT Long Term Goals - 11/19/20 0931      PT LONG TERM GOAL #1   Title Pt will be independent with HEP    Time 6    Period Weeks    Status On-going      PT LONG TERM GOAL #2   Title Pt will improve FOTO to >= 64 to demo improved functional mobility    Time 6    Period Weeks    Status On-going      PT LONG TERM GOAL #3   Title Pt will improve bilat LE strength to 4+/5 to return to work with decreased pain    Time 6    Period Weeks    Status On-going      PT LONG TERM GOAL #4   Title  Pt will tolerate standing > 30 minutes with pain <= 2/10    Baseline pt reports compensation, shifting wt to her LLE    Time 6    Period Weeks    Status Partially Met      PT LONG TERM GOAL #5   Title Pt will perform bed mobility without c/o dizziness    Time 6    Period Weeks    Status On-going                 Plan - 11/26/20 1016    Clinical Impression Statement Pt without light sensitivity today.  She continues to have weakness in RLE making SLS (with support) challenging and dizziness with Rt eye/head turns or the position of Rt sidelying.  Some double vision with retro gait, resolved with stillness and focus on single point.  Many of balance exercises performed in corner with intermittent UE to steady. Pt making good progress towards LTGs.    PT Next Visit Plan progress balance exercises, vestibular as needed    PT Home Exercise Plan Access Code: [facility]    Consulted and Agree with Plan of Care Patient           Patient will benefit from skilled therapeutic intervention in order to improve the following deficits and impairments:     Visit Diagnosis: Acute midline low back pain without sciatica  Muscle weakness (generalized)  Dizziness and giddiness  Other symptoms and signs involving the musculoskeletal system     Problem List Patient Active Problem List   Diagnosis Date Noted  . GAD (generalized anxiety disorder) 05/19/2020  . Asthma exacerbation 04/27/2015  . Thrombocytopenia ([place])   . Shortness of breath   . Essential hypertension   . Asthma   . Hypokalemia   . Status post cesarean section 01/20/2014  . Pregnancy 01/18/2014  . Allergic rhinitis, seasonal 12/04/2013  . Tobacco use disorder complicating pregnancy, childbirth, or the puerperium, antepartum condition or complication 93/26/7124  . Rh negative state in antepartum period 06/13/2013  . Supervision of normal pregnancy in first trimester 06/11/2013  . Obesity 06/11/2013  . Smoker  06/11/2013   Kerin Perna, PTA 11/26/20 10:27 AM  [place] [place], [place], [address] Phone: [phone]   Fax:  [phone]  Name: [name] MRN: [id number] Date of Birth: 12-19-1990

## 2020-11-29 ENCOUNTER — Encounter: Payer: Self-pay | Admitting: Rehabilitative and Restorative Service Providers"

## 2020-11-29 ENCOUNTER — Other Ambulatory Visit: Payer: Self-pay

## 2020-11-29 ENCOUNTER — Ambulatory Visit (INDEPENDENT_AMBULATORY_CARE_PROVIDER_SITE_OTHER): Payer: BC Managed Care – PPO | Admitting: Rehabilitative and Restorative Service Providers"

## 2020-11-29 DIAGNOSIS — R42 Dizziness and giddiness: Secondary | ICD-10-CM | POA: Diagnosis not present

## 2020-11-29 NOTE — Patient Instructions (Signed)
Access Code: [facility] URL: [email or web address] Date: 11/29/2020 Prepared by: Margretta Ditty  Program Notes CONVERGENCE:  hold the target and move it closer to your nose until it doubles.  Hold for 5 seconds, then move it away from your nose.  Repeat 5 times.    Exercises Standing with Head Rotation - 2 x daily - 7 x weekly - 1 sets - 5-8 reps Seated Gaze Stabilization with Head Rotation - 2 x daily - 7 x weekly - 1 sets - 10 reps Seated Gaze Stabilization with Head Nod - 2 x daily - 7 x weekly - 1 sets - 10 reps Standing Gaze Stabilization with Two Near Targets and Head Rotation - 2 x daily - 7 x weekly - 1 sets - 10 reps Standing Quarter Turn with Counter Support - 2 x daily - 7 x weekly - 1 sets - 5 reps Supine Lower Trunk Rotation - 1 x daily - 7 x weekly - 3 sets - 10 reps Hooklying Single Knee to Chest Stretch - 1 x daily - 7 x weekly - 3 sets - 1 reps Standing Shoulder Horizontal Abduction with Resistance - 1 x daily - 7 x weekly - 3 sets - 10 reps Standing Bilateral Low Shoulder Row with Anchored Resistance - 1 x daily - 7 x weekly - 3 sets - 10 reps Seated Transversus Abdominis Bracing - 1 x daily - 7 x weekly - 1 sets - 10 reps - 5 sec hold Wall Squat - 1 x daily - 3 x weekly - 1 sets - 10 reps Staggered Stance Squat - 1 x daily - 3 x weekly - 2 sets - 5 reps Prone Hip Extension - 1 x daily - 3 x weekly - 1-2 sets - 10 reps Bird Dog - 1 x daily - 3 x weekly - 1 sets - 10 reps

## 2020-11-29 NOTE — Therapy (Signed)
[place], [place], [address] Phone: [phone]   Fax:  [phone]  Physical Therapy Treatment  Patient Details  Name: [name] MRN: [id number] Date of Birth: Jan 06, 1991 Referring Provider (PT): Sherene Sires   Encounter Date: 11/29/2020   PT End of Session - 11/29/20 1017    Visit Number 9    Number of Visits 12    Date for PT Re-Evaluation 12/13/20    PT Start Time 0933    PT Stop Time 1014    PT Time Calculation (min) 41 min    Activity Tolerance Patient tolerated treatment well    Behavior During Therapy [facility] for tasks assessed/performed           Past Medical History:  Diagnosis Date  . Animal bite of finger    a cat  . Anxiety   . Asthma   . Bronchitis   . Endometriosis   . Insomnia   . PCOS (polycystic ovarian syndrome)   . Postgastrectomy malabsorption   . PTSD (post-traumatic stress disorder)   . Sleep apnea   . Sleep disorder     Past Surgical History:  Procedure Laterality Date  . CESAREAN SECTION N/A 01/20/2014   Procedure: CESAREAN SECTION;  Surgeon: Shelly Bombard, MD;  Location: [facility];  Service: Obstetrics;  Laterality: N/A;  . GASTRIC BYPASS    . MOUTH SURGERY    . WISDOM TOOTH EXTRACTION      There were no vitals filed for this visit.   Subjective Assessment - 11/29/20 0937    Subjective The patient had an exhausting weekend.  She had some confusion leading to taking an extra pain pill and also disorientation re: a friend's death-- it was a year ago and she felt that she was at the funeral.  Memory and balance are the 2 most limiting factors at this time.  She gets tired with multi-tasking.  The low back is significantly improved.    Patient Stated Goals decrease pain and dizziness, return to work    Currently in Pain? Yes    Pain Score 1     Pain Location Back    Pain Orientation Lower    Pain Descriptors / Indicators Aching    Pain Type Acute  pain    Pain Onset More than a month ago    Pain Frequency Constant    Pain Location Head    Pain Descriptors / Indicators Headache    Aggravating Factors  if she overfocuses                             [facility] Adult PT Treatment/Exercise - 11/29/20 1002      Exercises   Exercises Lumbar      Lumbar Exercises: Standing   Other Standing Lumbar Exercises single limb stance R and L sides; demonstrated deep squat for reaching into cabinets.           Vestibular Treatment/Exercise - 11/29/20 0940      Vestibular Treatment/Exercise   Vestibular Treatment Provided Gaze;Habituation    Habituation Exercises Standing Horizontal Head Turns;Brandt Daroff;Horizontal Roll;180 degree Turns    Gaze Exercises X1 Viewing Horizontal;X1 Viewing Vertical;Eye/Head Exercise Horizontal      Canal Roll Right   Number of Reps  2    Response Details  rolling R<>L for habituation; to R feels "dizzy" and "unfocused"/ to the L got spinning first rep; nausea 2/10  Nestor Lewandowsky   Number of Reps  2    Symptom Description  1-2/10 dizziness, no nausea      Standing Horizontal Head Turns   Number of Reps  5    Symptom Description  3-4/10 dizziness and imbalance; visual spotting of targets helps      180 degree Turns   Number of Reps  5    Symptom Description  began with quarter turns R and L with visual spotting      X1 Viewing Horizontal   Foot Position seated    Comments blurs to the right; harder to do to the right; 30 seconds      X1 Viewing Vertical   Foot Position seated    Comments blurs moving back up; 30 seconds                 PT Education - 11/29/20 1016    Education Details HEP progression for gaze and balance    Person(s) Educated Patient    Methods Explanation;Demonstration;Handout    Comprehension Verbalized understanding;Returned demonstration               PT Long Term Goals - 11/19/20 0931      PT LONG TERM GOAL #1   Title Pt will be  independent with HEP    Time 6    Period Weeks    Status On-going      PT LONG TERM GOAL #2   Title Pt will improve FOTO to >= 64 to demo improved functional mobility    Time 6    Period Weeks    Status On-going      PT LONG TERM GOAL #3   Title Pt will improve bilat LE strength to 4+/5 to return to work with decreased pain    Time 6    Period Weeks    Status On-going      PT LONG TERM GOAL #4   Title Pt will tolerate standing > 30 minutes with pain <= 2/10    Baseline pt reports compensation, shifting wt to her LLE    Time 6    Period Weeks    Status Partially Met      PT LONG TERM GOAL #5   Title Pt will perform bed mobility without c/o dizziness    Time 6    Period Weeks    Status On-going                 Plan - 11/29/20 1144    Clinical Impression Statement The patient is making progress with light tolerance, low back pain is diminishing and she is tolerating greater activity overall.  She is fatigued today from a busy weekend.  PT to continue working to [employer].    PT Treatment/Interventions Taping;Dry needling;Passive range of motion;Manual techniques;Patient/family education;Therapeutic activities;Therapeutic exercise;Balance training;Neuromuscular re-education;Functional mobility training;Electrical Stimulation;Iontophoresis 86m/ml Dexamethasone;Moist Heat;Cryotherapy;Canalith Repostioning;Aquatic Therapy;Vestibular    PT Next Visit Plan progress balance exercises, vestibular as needed    PT Home Exercise Plan Access Code: 8[facility]   Consulted and Agree with Plan of Care Patient           Patient will benefit from skilled therapeutic intervention in order to improve the following deficits and impairments:     Visit Diagnosis: Dizziness and giddiness     Problem List Patient Active Problem List   Diagnosis Date Noted  . GAD (generalized anxiety disorder) 05/19/2020  . Asthma exacerbation 04/27/2015  . [place]ocyt[place] ([place]   . Shortness  of breath    . Essential hypertension   . Asthma   . Hypokalemia   . Status post cesarean section 01/20/2014  . Pregnancy 01/18/2014  . Allergic rhinitis, seasonal 12/04/2013  . Tobacco use disorder complicating pregnancy, childbirth, or the puerperium, antepartum condition or complication 76/81/1572  . Rh negative state in antepartum period 06/13/2013  . Supervision of normal pregnancy in first trimester 06/11/2013  . Obesity 06/11/2013  . Smoker 06/11/2013    [place], PT 11/29/2020, 1:27 PM  [facility] [place], [place], [address] Phone: [phone]   Fax:  [phone]  Name: [name] MRN: [id number] Date of Birth: 12-28-1990

## 2020-12-01 ENCOUNTER — Ambulatory Visit (INDEPENDENT_AMBULATORY_CARE_PROVIDER_SITE_OTHER): Payer: BC Managed Care – PPO | Admitting: Physical Therapy

## 2020-12-01 ENCOUNTER — Other Ambulatory Visit: Payer: Self-pay

## 2020-12-01 DIAGNOSIS — R262 Difficulty in walking, not elsewhere classified: Secondary | ICD-10-CM

## 2020-12-01 DIAGNOSIS — R42 Dizziness and giddiness: Secondary | ICD-10-CM

## 2020-12-01 DIAGNOSIS — R29898 Other symptoms and signs involving the musculoskeletal system: Secondary | ICD-10-CM

## 2020-12-01 DIAGNOSIS — H8111 Benign paroxysmal vertigo, right ear: Secondary | ICD-10-CM

## 2020-12-01 DIAGNOSIS — M6281 Muscle weakness (generalized): Secondary | ICD-10-CM

## 2020-12-01 NOTE — Therapy (Signed)
[place], [place], [address] Phone: [phone]   Fax:  [phone]  Physical Therapy Treatment  Patient Details  Name: [name] MRN: [id number] Date of Birth: 1990-10-18 Referring Provider (PT): Sherene Sires   Encounter Date: 12/01/2020   PT End of Session - 12/01/20 1012    Visit Number 10    Number of Visits 12    Date for PT Re-Evaluation 12/13/20    PT Start Time 0930    PT Stop Time 1015    PT Time Calculation (min) 45 min    Activity Tolerance Patient tolerated treatment well    Behavior During Therapy [facility] for tasks assessed/performed           Past Medical History:  Diagnosis Date  . Animal bite of finger    a cat  . Anxiety   . Asthma   . Bronchitis   . Endometriosis   . Insomnia   . PCOS (polycystic ovarian syndrome)   . Postgastrectomy malabsorption   . PTSD (post-traumatic stress disorder)   . Sleep apnea   . Sleep disorder     Past Surgical History:  Procedure Laterality Date  . CESAREAN SECTION N/A 01/20/2014   Procedure: CESAREAN SECTION;  Surgeon: Shelly Bombard, MD;  Location: [facility];  Service: Obstetrics;  Laterality: N/A;  . GASTRIC BYPASS    . MOUTH SURGERY    . WISDOM TOOTH EXTRACTION      There were no vitals filed for this visit.   Subjective Assessment - 12/01/20 0935    Subjective Pt states she is feeling much better than last visit. States she slept all day after last visit, not as much confusion after rest    Patient Stated Goals decrease pain and dizziness, return to work    Currently in Pain? No/denies                             [facility] Adult PT Treatment/Exercise - 12/01/20 0001      High Level Balance   High Level Balance Comments standing ball toss laterally to self with walking, walking with ball toss forward and backward, standing on balane board laterally and A/P with head turns with intermittent UE support,  standing alternating between ball toss and kick           Vestibular Treatment/Exercise - 12/01/20 0001      Standing Horizontal Head Turns   Number of Reps  10    Symptom Description  2/10 dizziness      X1 Viewing Horizontal   Foot Position seated    Comments still blurs to the Rt      X1 Viewing Vertical   Foot Position seated    Comments dizzy 1/10                      PT Long Term Goals - 11/19/20 0931      PT LONG TERM GOAL #1   Title Pt will be independent with HEP    Time 6    Period Weeks    Status On-going      PT LONG TERM GOAL #2   Title Pt will improve FOTO to >= 64 to demo improved functional mobility    Time 6    Period Weeks    Status On-going      PT LONG TERM GOAL #3   Title  Pt will improve bilat LE strength to 4+/5 to return to work with decreased pain    Time 6    Period Weeks    Status On-going      PT LONG TERM GOAL #4   Title Pt will tolerate standing > 30 minutes with pain <= 2/10    Baseline pt reports compensation, shifting wt to her LLE    Time 6    Period Weeks    Status Partially Met      PT LONG TERM GOAL #5   Title Pt will perform bed mobility without c/o dizziness    Time 6    Period Weeks    Status On-going                 Plan - 12/01/20 1012    Clinical Impression Statement Pt is making good progress, mostly limited with turning Rt and eye coordination. back pain not an issue this session    PT Next Visit Plan progress balance exercises, vestibular as needed    PT Home Exercise Plan Access Code: [facility]    Consulted and Agree with Plan of Care Patient           Patient will benefit from skilled therapeutic intervention in order to improve the following deficits and impairments:     Visit Diagnosis: Dizziness and giddiness  Muscle weakness (generalized)  Other symptoms and signs involving the musculoskeletal system  Difficulty in walking, not elsewhere classified  BPPV (benign paroxysmal  positional vertigo), right     Problem List Patient Active Problem List   Diagnosis Date Noted  . GAD (generalized anxiety disorder) 05/19/2020  . Asthma exacerbation 04/27/2015  . Thrombocytopenia ([place])   . Shortness of breath   . Essential hypertension   . Asthma   . Hypokalemia   . Status post cesarean section 01/20/2014  . Pregnancy 01/18/2014  . Allergic rhinitis, seasonal 12/04/2013  . Tobacco use disorder complicating pregnancy, childbirth, or the puerperium, antepartum condition or complication 84/13/2440  . Rh negative state in antepartum period 06/13/2013  . Supervision of normal pregnancy in first trimester 06/11/2013  . Obesity 06/11/2013  . Smoker 06/11/2013   [name], PT  [name] 12/01/2020, 10:14 AM  [facility] [place], [place], [address] Phone: [phone]   Fax:  [phone]  Name: [name] MRN: [id number] Date of Birth: May 31, 1991

## 2020-12-06 ENCOUNTER — Ambulatory Visit (INDEPENDENT_AMBULATORY_CARE_PROVIDER_SITE_OTHER): Payer: BC Managed Care – PPO | Admitting: Rehabilitative and Restorative Service Providers"

## 2020-12-06 ENCOUNTER — Other Ambulatory Visit: Payer: Self-pay

## 2020-12-06 DIAGNOSIS — R29898 Other symptoms and signs involving the musculoskeletal system: Secondary | ICD-10-CM | POA: Diagnosis not present

## 2020-12-06 DIAGNOSIS — R42 Dizziness and giddiness: Secondary | ICD-10-CM | POA: Diagnosis not present

## 2020-12-06 DIAGNOSIS — M6281 Muscle weakness (generalized): Secondary | ICD-10-CM | POA: Diagnosis not present

## 2020-12-06 NOTE — Therapy (Signed)
[place], [place], [address] Phone: [phone]   Fax:  [phone]  Physical Therapy Treatment  Patient Details  Name: [name] MRN: [id number] Date of Birth: 1991-07-28 Referring Provider (PT): Sherene Sires   Encounter Date: 12/06/2020   PT End of Session - 12/06/20 0942    Visit Number 11    Number of Visits 12    Date for PT Re-Evaluation 12/13/20    PT Start Time 0936    PT Stop Time 1016    PT Time Calculation (min) 40 min    Activity Tolerance Patient tolerated treatment well    Behavior During Therapy [facility] for tasks assessed/performed           Past Medical History:  Diagnosis Date  . Animal bite of finger    a cat  . Anxiety   . Asthma   . Bronchitis   . Endometriosis   . Insomnia   . PCOS (polycystic ovarian syndrome)   . Postgastrectomy malabsorption   . PTSD (post-traumatic stress disorder)   . Sleep apnea   . Sleep disorder     Past Surgical History:  Procedure Laterality Date  . CESAREAN SECTION N/A 01/20/2014   Procedure: CESAREAN SECTION;  Surgeon: Shelly Bombard, MD;  Location: [facility];  Service: Obstetrics;  Laterality: N/A;  . GASTRIC BYPASS    . MOUTH SURGERY    . WISDOM TOOTH EXTRACTION      There were no vitals filed for this visit.   Subjective Assessment - 12/06/20 0937    Subjective The patient reports neck and low back pain resolved.  She is improving with light sensitivity and ability to use a computer.  She is up to 10-15 minutes at a time on a computer.  Her biggest limitations are short term memory and imbalance with quick turns.  She feels like she can return to driving later this week.  She spoke with neurologist about return to work late May to allow further recovery with memory and multi-tasking    Diagnostic tests shows lumbar bulging discs    Patient Stated Goals decrease pain and dizziness, return to work    Currently in Pain?  No/denies              [facility] PT Assessment - 12/06/20 1019      Assessment   Medical Diagnosis lumbar radiculopathy, neck pain    Referring Provider (PT) Sherene Sires    Onset Date/Surgical Date 10/15/20      Strength   Right Hip Flexion 4/5    Right Hip ABduction 4/5    Left Hip Flexion 5/5    Left Hip ABduction 5/5                         [facility] Adult PT Treatment/Exercise - 12/06/20 0001      Ambulation/Gait   Ambulation/Gait Yes    Gait Comments patient demonstrates jogging (she has tried this at home); she feels more catching of right foot during jogging.  PT recommended continued walking b/c she notes jogging gives her headaches.  Will work on progresison in therapy.      Self-Care   Self-Care Other Self-Care Comments    Other Self-Care Comments  discussed activities at home and patient notes the only physical concern with work is lifting animals onto exam table.  She is working on squats at home and wall slides from HEP.  Neuro Re-ed    Neuro Re-ed Details  The patient performed tennis ball toss R<>L with visual tracking adding this to rocker board task with reactive balance with perturbations to board, also performed with cognitive task to progress multi-tasking abilities (naming animals with every letter of the alphabet), performed rocker board reactive balance laterally and in ant/posterior plane.  Walking with ball toss.  Single limb stance activities.  Turning in place x 180 degrees R and L (modified on HEP).                       PT Long Term Goals - 12/06/20 1012      PT LONG TERM GOAL #1   Title Pt will be independent with HEP    Time 6    Period Weeks    Status Achieved    Target Date 12/13/20      PT LONG TERM GOAL #2   Title Pt will improve FOTO to >= 64 to demo improved functional mobility    Baseline 63% on 12/06/20    Time 6    Period Weeks    Status Partially Met      PT LONG TERM GOAL #3   Title Pt will improve  bilat LE strength to 4+/5 to return to work with decreased pain    Time 6    Period Weeks    Status On-going      PT LONG TERM GOAL #4   Title Pt will tolerate standing > 30 minutes with pain <= 2/10    Time 6    Period Weeks    Status Achieved      PT LONG TERM GOAL #5   Title Pt will perform bed mobility without c/o dizziness    Time 6    Period Weeks    Status Achieved                 Plan - 12/06/20 1013    Clinical Impression Statement The patient has met 3 LTGs.  She continues with deficits in short term memory and multi-tasking and may benefit from referral to occupational therapy for functional cognitive training and compensatory techniques.  PT to focus on R hip and LE strengthening, high level balance, and multi-tasking during functional balance tasks/turns.    PT Treatment/Interventions Taping;Dry needling;Passive range of motion;Manual techniques;Patient/family education;Therapeutic activities;Therapeutic exercise;Balance training;Neuromuscular re-education;Functional mobility training;Electrical Stimulation;Iontophoresis 62m/ml Dexamethasone;Moist Heat;Cryotherapy;Canalith Repostioning;Aquatic Therapy;Vestibular    PT Next Visit Plan progress balance exercises, LE strengthening    PT Home Exercise Plan Access Code: 8[facility]   Consulted and Agree with Plan of Care Patient           Patient will benefit from skilled therapeutic intervention in order to improve the following deficits and impairments:     Visit Diagnosis: Dizziness and giddiness  Muscle weakness (generalized)  Other symptoms and signs involving the musculoskeletal system     Problem List Patient Active Problem List   Diagnosis Date Noted  . GAD (generalized anxiety disorder) 05/19/2020  . Asthma exacerbation 04/27/2015  . [place] ([place]   . Shortness of breath   . Essential hypertension   . Asthma   . Hypokalemia   . Status post cesarean section 01/20/2014  . Pregnancy  01/18/2014  . Allergic rhinitis, seasonal 12/04/2013  . Tobacco use disorder complicating pregnancy, childbirth, or the puerperium, antepartum condition or complication 138/75/6433 . Rh negative state in antepartum period 06/13/2013  . Supervision of normal pregnancy  in first trimester 06/11/2013  . Obesity 06/11/2013  . Smoker 06/11/2013    [place], PT 12/06/2020, 4:45 PM  [facility] [place], [place], [address] Phone: [phone]   Fax:  [phone]  Name: [name] MRN: [id number] Date of Birth: [birth year]

## 2020-12-07 ENCOUNTER — Ambulatory Visit (INDEPENDENT_AMBULATORY_CARE_PROVIDER_SITE_OTHER): Payer: BC Managed Care – PPO | Admitting: Family Medicine

## 2020-12-07 VITALS — BP 134/88 | HR 62 | Ht 68.0 in | Wt 176.0 lb

## 2020-12-07 DIAGNOSIS — S060X0D Concussion without loss of consciousness, subsequent encounter: Secondary | ICD-10-CM

## 2020-12-07 DIAGNOSIS — G4733 Obstructive sleep apnea (adult) (pediatric): Secondary | ICD-10-CM | POA: Diagnosis not present

## 2020-12-07 DIAGNOSIS — Z9884 Bariatric surgery status: Secondary | ICD-10-CM | POA: Diagnosis not present

## 2020-12-07 DIAGNOSIS — F411 Generalized anxiety disorder: Secondary | ICD-10-CM | POA: Diagnosis not present

## 2020-12-07 DIAGNOSIS — K912 Postsurgical malabsorption, not elsewhere classified: Secondary | ICD-10-CM | POA: Diagnosis not present

## 2020-12-07 NOTE — Patient Instructions (Addendum)
Thank you for coming in today.  Exercise is ok.   Driving if safe is ok. Start little and go slow.   Ok to work to your limit if not hurting.   Ologies podcast concussion Neuropathology (CONCUSSIONS) with Dr. Karie Fetch.  Agree with RTW 5/27.   Recheck before 5/27.
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
## 2020-12-08 ENCOUNTER — Ambulatory Visit (INDEPENDENT_AMBULATORY_CARE_PROVIDER_SITE_OTHER): Payer: BC Managed Care – PPO | Admitting: Physical Therapy

## 2020-12-08 ENCOUNTER — Other Ambulatory Visit: Payer: Self-pay

## 2020-12-08 DIAGNOSIS — M6281 Muscle weakness (generalized): Secondary | ICD-10-CM | POA: Diagnosis not present

## 2020-12-08 DIAGNOSIS — M545 Low back pain, unspecified: Secondary | ICD-10-CM

## 2020-12-08 DIAGNOSIS — R42 Dizziness and giddiness: Secondary | ICD-10-CM

## 2020-12-08 DIAGNOSIS — R262 Difficulty in walking, not elsewhere classified: Secondary | ICD-10-CM

## 2020-12-08 DIAGNOSIS — R29898 Other symptoms and signs involving the musculoskeletal system: Secondary | ICD-10-CM | POA: Diagnosis not present

## 2020-12-08 DIAGNOSIS — H8111 Benign paroxysmal vertigo, right ear: Secondary | ICD-10-CM

## 2020-12-08 NOTE — Therapy (Signed)
[place], [place], [address] Phone: [phone]   Fax:  [phone]  Physical Therapy Treatment and Recertification  Patient Details  Name: [name] MRN: [id number] Date of Birth: 1991/07/10 Referring Provider (PT): Sherene Sires   Encounter Date: 12/08/2020   PT End of Session - 12/08/20 1427    Visit Number 12    Number of Visits 18    Date for PT Re-Evaluation 01/19/21    PT Start Time 4503    PT Stop Time 1430    PT Time Calculation (min) 45 min    Activity Tolerance Patient tolerated treatment well    Behavior During Therapy [facility] for tasks assessed/performed           Past Medical History:  Diagnosis Date  . Animal bite of finger    a cat  . Anxiety   . Asthma   . Bronchitis   . Endometriosis   . Insomnia   . PCOS (polycystic ovarian syndrome)   . Postgastrectomy malabsorption   . PTSD (post-traumatic stress disorder)   . Sleep apnea   . Sleep disorder     Past Surgical History:  Procedure Laterality Date  . CESAREAN SECTION N/A 01/20/2014   Procedure: CESAREAN SECTION;  Surgeon: Shelly Bombard, MD;  Location: [place] [facility];  Service: Obstetrics;  Laterality: N/A;  . GASTRIC BYPASS    . MOUTH SURGERY    . WISDOM TOOTH EXTRACTION      There were no vitals filed for this visit.   Subjective Assessment - 12/08/20 1348    Subjective Pt states she saw Dr Georgina Snell and the he recommends waiting to start work until 01/19/21. Pt states that he encouraged her to try driving in a controlled enviroment and work on computer as tolerated.  Pt still has dizziness with quick turns, improving with quarter turns    Patient Stated Goals decrease pain and dizziness, return to work    Currently in Pain? No/denies                             [facility] Adult PT Treatment/Exercise - 12/08/20 0001      Lumbar Exercises: Aerobic   Tread Mill 4 mins 2.42mh for warm up      Lumbar  Exercises: Machines for Strengthening   Leg Press 14 plates bilat LE x 10, Rt LE only 5 plates 2 x 10      Lumbar Exercises: Standing   Lifting From floor;10 reps   dead lift   Lifting Weights (lbs) 15    Other Standing Lumbar Exercises runners step up to 12'' step x 10 bilat with intermittent UE support    Other Standing Lumbar Exercises wall squat 2 x 10, Stance on Rt LE with "washing floor in half circles x 10 with LT LE      Lumbar Exercises: Quadruped   Opposite Arm/Leg Raise Right arm/Left leg;Left arm/Right leg;5 reps      Manual Therapy   Manual Therapy Soft tissue mobilization    Soft tissue mobilization STM and TPR Rt ITB and hamstrings                       PT Long Term Goals - 12/06/20 1012      PT LONG TERM GOAL #1   Title Pt will be independent with HEP    Time 6    Period  Weeks    Status Achieved    Target Date 12/13/20      PT LONG TERM GOAL #2   Title Pt will improve FOTO to >= 64 to demo improved functional mobility    Baseline 63% on 12/06/20    Time 6    Period Weeks    Status Partially Met      PT LONG TERM GOAL #3   Title Pt will improve bilat LE strength to 4+/5 to return to work with decreased pain    Time 6    Period Weeks    Status On-going      PT LONG TERM GOAL #4   Title Pt will tolerate standing > 30 minutes with pain <= 2/10    Time 6    Period Weeks    Status Achieved      PT LONG TERM GOAL #5   Title Pt will perform bed mobility without c/o dizziness    Time 6    Period Weeks    Status Achieved                 Plan - 12/08/20 1429    Clinical Impression Statement Pt has met 3 LTGs and continues with decreased core and Rt hip strength and memory deficits. Pt has improved balance and decreased neck and lumbar pain. Pt will continue to benefit from skilled PT for Rt hip and core strengthening, high level balance and multitasking during functional actvities.    Examination-Activity Limitations Bed  Mobility;Carry;Transfers;Stairs;Lift;Squat;Locomotion Level;Stand;Bend    Examination-Participation Restrictions Cleaning;Shop;Yard Work;Occupation;Driving;Community Activity;Meal Prep    Rehab Potential Good    PT Frequency 1x / week    PT Duration 6 weeks    PT Treatment/Interventions Taping;Dry needling;Passive range of motion;Manual techniques;Patient/family education;Therapeutic activities;Therapeutic exercise;Balance training;Neuromuscular re-education;Functional mobility training;Electrical Stimulation;Iontophoresis 46m/ml Dexamethasone;Moist Heat;Cryotherapy;Canalith Repostioning;Aquatic Therapy;Vestibular    PT Next Visit Plan Rt hip strength and balance    PT Home Exercise Plan Access Code: [email or web address]          Patient will benefit from skilled therapeutic intervention in order to improve the following deficits and impairments:  Decreased balance,Decreased mobility,Difficulty walking,Increased muscle spasms,Dizziness,Decreased range of motion,Decreased activity tolerance,Decreased strength,Pain  Visit Diagnosis: Dizziness and giddiness - Plan: PT plan of care cert/re-cert  Muscle weakness (generalized) - Plan: PT plan of care cert/re-cert  Other symptoms and signs involving the musculoskeletal system - Plan: PT plan of care cert/re-cert  Difficulty in walking, not elsewhere classified - Plan: PT plan of care cert/re-cert  BPPV (benign paroxysmal positional vertigo), right - Plan: PT plan of care cert/re-cert  Acute midline low back pain without sciatica - Plan: PT plan of care cert/re-cert     Problem List Patient Active Problem List   Diagnosis Date Noted  . GAD (generalized anxiety disorder) 05/19/2020  . Asthma exacerbation 04/27/2015  . Thrombocytopenia ([place]   . Shortness of breath   . Essential hypertension   . Asthma   . Hypokalemia   . Status post cesarean section 01/20/2014  . Pregnancy 01/18/2014  . Allergic rhinitis, seasonal 12/04/2013  . Tobacco use  disorder complicating pregnancy, childbirth, or the puerperium, antepartum condition or complication 178/29/5621 . Rh negative state in antepartum period 06/13/2013  . Obesity 06/11/2013  . Smoker 06/11/2013   Ladarion Munyon, PT  Lauralee Waters 12/08/2020, 2:32 PM  [facility] [address]Phone: [phone]  Fax:  [phone] Name: Cassie SMITHERMRN: [id number]  Date of Birth: 1991/01/17

## 2020-12-08 NOTE — Patient Instructions (Signed)
Access Code: [place] URL: [email or web address] Date: 12/08/2020 Prepared by: Reggy Eye  Program Notes CONVERGENCE:  hold the target and move it closer to your nose until it doubles.  Hold for 5 seconds, then move it away from your nose.  Repeat 5 times.    Exercises Seated Gaze Stabilization with Head Rotation - 2 x daily - 7 x weekly - 1 sets - 10 reps Seated Gaze Stabilization with Head Nod - 2 x daily - 7 x weekly - 1 sets - 10 reps Standing Gaze Stabilization with Two Near Targets and Head Rotation - 2 x daily - 7 x weekly - 1 sets - 10 reps Standing Quarter Turn with Counter Support - 2 x daily - 7 x weekly - 1 sets - 5 reps Wall Squat - 1 x daily - 3 x weekly - 2 sets - 10 reps Staggered Stance Squat - 1 x daily - 3 x weekly - 2 sets - 10 reps Bird Dog - 1 x daily - 3 x weekly - 2 sets - 10 reps Runner's Step Up/Down - 1 x daily - 3 x weekly - 2 sets - 10 reps Kettlebell Deadlift - 1 x daily - 3 x weekly - 2 sets - 10 reps

## 2020-12-13 ENCOUNTER — Encounter: Payer: BC Managed Care – PPO | Admitting: Physical Therapy

## 2020-12-14 ENCOUNTER — Ambulatory Visit (INDEPENDENT_AMBULATORY_CARE_PROVIDER_SITE_OTHER): Payer: BC Managed Care – PPO | Admitting: Rehabilitative and Restorative Service Providers"

## 2020-12-14 ENCOUNTER — Encounter: Payer: Self-pay | Admitting: Rehabilitative and Restorative Service Providers"

## 2020-12-14 ENCOUNTER — Other Ambulatory Visit: Payer: Self-pay

## 2020-12-14 DIAGNOSIS — M6281 Muscle weakness (generalized): Secondary | ICD-10-CM

## 2020-12-14 DIAGNOSIS — S6010XA Contusion of unspecified finger with damage to nail, initial encounter: Secondary | ICD-10-CM | POA: Diagnosis not present

## 2020-12-14 DIAGNOSIS — R262 Difficulty in walking, not elsewhere classified: Secondary | ICD-10-CM | POA: Diagnosis not present

## 2020-12-14 DIAGNOSIS — R29898 Other symptoms and signs involving the musculoskeletal system: Secondary | ICD-10-CM | POA: Diagnosis not present

## 2020-12-14 DIAGNOSIS — S61309D Unspecified open wound of unspecified finger with damage to nail, subsequent encounter: Secondary | ICD-10-CM | POA: Diagnosis not present

## 2020-12-14 DIAGNOSIS — L03011 Cellulitis of right finger: Secondary | ICD-10-CM | POA: Diagnosis not present
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
## 2020-12-14 NOTE — Patient Instructions (Signed)
Access Code: [email or web address]: [email or web address]: 04/26/2022Prepared by: Marlisha Vanwyk HoltExercises  Prone Press Up - 2 x daily - 7 x weekly - 1 sets - 10 reps - 2-3 sec hold  Prone Press Up On Elbows - 2 x daily - 7 x weekly - 1 sets - 3 reps - 30 sec hold  Doorway Pec Stretch at 60 Degrees Abduction - 3 x daily - 7 x weekly - 3 reps - 1 sets  Doorway Pec Stretch at 90 Degrees Abduction - 3 x daily - 7 x weekly - 3 reps - 1 sets - 30 seconds hold  Doorway Pec Stretch at 120 Degrees Abduction - 3 x daily - 7 x weekly - 3 reps - 1 sets - 30 second hold hold  Standing Shoulder External Rotation with Resistance - 2 x daily - 7 x weekly - 1-3 sets - 10 reps - 2-3 sec hold  Standing Bilateral Low Shoulder Row with Anchored Resistance - 2 x daily - 7 x weekly - 1-3 sets - 10 reps - 2-3 sec hold  Shoulder Extension with Resistance - 2 x daily - 7 x weekly - 1-3 sets - 10 reps - 2-3 sec hold  Standing High Row with Resistance - 2 x daily - 7 x weekly - 1 sets - 10 reps - 3-5 sec hold  Prone Shoulder Extension - 2 x daily - 7 x weekly - 1 sets - 10 reps - 3-5 sec hold  Hooklying Hamstring Stretch with Strap - 2 x daily - 7 x weekly - 1 sets - 3 reps - 30 sec hold  Supine ITB Stretch with Strap - 2 x daily - 7 x weekly - 1 sets - 3 reps - 30 sec hold  Supine Piriformis Stretch with Leg Straight - 2 x daily - 7 x weekly - 1 sets - 3 reps - 30 sec hold  Hip Flexor Stretch at Edge of Bed - 2 x daily - 7 x weekly - 1 sets - 3 reps - 30 sec hold  Sit to Stand - 2 x daily - 7 x weekly - 1 sets - 10 reps - 3-5 sec hold

## 2020-12-15 ENCOUNTER — Encounter: Payer: BC Managed Care – PPO | Admitting: Physical Therapy

## 2020-12-20 ENCOUNTER — Encounter: Payer: BC Managed Care – PPO | Admitting: Physical Therapy

## 2020-12-20 DIAGNOSIS — F411 Generalized anxiety disorder: Secondary | ICD-10-CM | POA: Diagnosis not present

## 2020-12-22 ENCOUNTER — Other Ambulatory Visit: Payer: Self-pay

## 2020-12-22 ENCOUNTER — Ambulatory Visit (INDEPENDENT_AMBULATORY_CARE_PROVIDER_SITE_OTHER): Payer: BC Managed Care – PPO | Admitting: Physical Therapy

## 2020-12-22 DIAGNOSIS — S069X9D Unspecified intracranial injury with loss of consciousness of unspecified duration, subsequent encounter: Secondary | ICD-10-CM | POA: Diagnosis not present

## 2020-12-22 DIAGNOSIS — R200 Anesthesia of skin: Secondary | ICD-10-CM | POA: Diagnosis not present

## 2020-12-22 DIAGNOSIS — R42 Dizziness and giddiness: Secondary | ICD-10-CM

## 2020-12-22 DIAGNOSIS — R262 Difficulty in walking, not elsewhere classified: Secondary | ICD-10-CM

## 2020-12-22 DIAGNOSIS — M6281 Muscle weakness (generalized): Secondary | ICD-10-CM

## 2020-12-22 DIAGNOSIS — R29898 Other symptoms and signs involving the musculoskeletal system: Secondary | ICD-10-CM | POA: Diagnosis not present

## 2020-12-22 DIAGNOSIS — F0781 Postconcussional syndrome: Secondary | ICD-10-CM | POA: Diagnosis not present

## 2020-12-22 NOTE — Therapy (Signed)
[place], [place], [address] Phone: [phone]   Fax:  [phone]  Physical Therapy Treatment  Patient Details  Name: ADAIJAH ENDRES MRN: [id number] Date of Birth: [birth year] Referring Provider (PT): Sherene Sires   Encounter Date: 12/22/2020   PT End of Session - 12/22/20 1010    Visit Number 14    Number of Visits 18    Date for PT Re-Evaluation 01/19/21    PT Start Time 0930    PT Stop Time 1011    PT Time Calculation (min) 41 min    Activity Tolerance Patient tolerated treatment well    Behavior During Therapy [facility] for tasks assessed/performed           Past Medical History:  Diagnosis Date  . Animal bite of finger    a cat  . Anxiety   . Asthma   . Bronchitis   . Endometriosis   . Insomnia   . PCOS (polycystic ovarian syndrome)   . Postgastrectomy malabsorption   . PTSD (post-traumatic stress disorder)   . Sleep apnea   . Sleep disorder     Past Surgical History:  Procedure Laterality Date  . CESAREAN SECTION N/A 01/20/2014   Procedure: CESAREAN SECTION;  Surgeon: Shelly Bombard, MD;  Location: [place] [facility];  Service: Obstetrics;  Laterality: N/A;  . GASTRIC BYPASS    . MOUTH SURGERY    . WISDOM TOOTH EXTRACTION      There were no vitals filed for this visit.   Subjective Assessment - 12/22/20 0934    Subjective Pt states she has been able to run on the treadmill at the gym and only stumbled occasionally to the right.  pt has been driving on the road for the past week but only short distances.    Patient Stated Goals decrease pain and dizziness, return to work    Currently in Pain? No/denies                             [facility] Adult PT Treatment/Exercise - 12/22/20 0001      Ambulation/Gait   Gait Comments gait ladder hopping, jumping, forward, laterally all working on coordination and timing for Rt LE      High Level Balance   High Level Balance  Comments standing on trampoline med ball toss with focus on Rt visual field      Lumbar Exercises: Stretches   Passive Hamstring Stretch Right;Left;2 reps;30 seconds   supine with strap   Hip Flexor Stretch 2 reps;30 seconds   thomas   ITB Stretch Right;30 seconds;2 reps;Left    ITB Stretch Limitations supine with strap      Lumbar Exercises: Aerobic   Tread Mill 2.5 mph x 5 mins for warm up      Lumbar Exercises: Standing   Lifting From floor;10 reps    Lifting Weights (lbs) single leg dead lift 15# KB                       PT Long Term Goals - 12/06/20 1012      PT LONG TERM GOAL #1   Title Pt will be independent with HEP    Time 6    Period Weeks    Status Achieved    Target Date 12/13/20      PT LONG TERM GOAL #2   Title Pt will improve FOTO to >=  64 to demo improved functional mobility    Baseline 63% on 12/06/20    Time 6    Period Weeks    Status Partially Met      PT LONG TERM GOAL #3   Title Pt will improve bilat LE strength to 4+/5 to return to work with decreased pain    Time 6    Period Weeks    Status On-going      PT LONG TERM GOAL #4   Title Pt will tolerate standing > 30 minutes with pain <= 2/10    Time 6    Period Weeks    Status Achieved      PT LONG TERM GOAL #5   Title Pt will perform bed mobility without c/o dizziness    Time 6    Period Weeks    Status Achieved                 Plan - 12/22/20 1011    Clinical Impression Statement pt continues to progress balance and coordination. Pt challenged by gait ladder today requiring increased time for all Rt LE tasks. Continued to encourage balance and coordination training at home    PT Next Visit Plan coordination, balance, jogging    PT Home Exercise Plan Access Code: [facility]           Patient will benefit from skilled therapeutic intervention in order to improve the following deficits and impairments:     Visit Diagnosis: Muscle weakness (generalized)  Other  symptoms and signs involving the musculoskeletal system  Difficulty in walking, not elsewhere classified  Dizziness and giddiness     Problem List Patient Active Problem List   Diagnosis Date Noted  . GAD (generalized anxiety disorder) 05/19/2020  . Asthma exacerbation 04/27/2015  . Thrombocytopenia ([place])   . Shortness of breath   . Essential hypertension   . Asthma   . Hypokalemia   . Status post cesarean section 01/20/2014  . Pregnancy 01/18/2014  . Allergic rhinitis, seasonal 12/04/2013  . Tobacco use disorder complicating pregnancy, childbirth, or the puerperium, antepartum condition or complication 47/34/0370  . Rh negative state in antepartum period 06/13/2013  . Obesity 06/11/2013  . Smoker 06/11/2013   Willman Cuny, PT  Damontay Alred 12/22/2020, 10:13 AM  [facility] [place], [place], [address] Phone: [phone]   Fax:  [phone]  Name: KATHLEENE BERGEMANN MRN: [id number] Date of Birth: 07-28-1991

## 2020-12-23 DIAGNOSIS — F411 Generalized anxiety disorder: Secondary | ICD-10-CM | POA: Diagnosis not present

## 2020-12-23 DIAGNOSIS — F3289 Other specified depressive episodes: Secondary | ICD-10-CM | POA: Diagnosis not present

## 2020-12-23 DIAGNOSIS — F4310 Post-traumatic stress disorder, unspecified: Secondary | ICD-10-CM | POA: Diagnosis not present

## 2020-12-27 DIAGNOSIS — F411 Generalized anxiety disorder: Secondary | ICD-10-CM | POA: Diagnosis not present

## 2020-12-30 DIAGNOSIS — Z713 Dietary counseling and surveillance: Secondary | ICD-10-CM | POA: Diagnosis not present

## 2020-12-30 DIAGNOSIS — Z9884 Bariatric surgery status: Secondary | ICD-10-CM | POA: Diagnosis not present

## 2020-12-30 DIAGNOSIS — Z6825 Body mass index (BMI) 25.0-25.9, adult: Secondary | ICD-10-CM | POA: Diagnosis not present

## 2020-12-31 DIAGNOSIS — Z9884 Bariatric surgery status: Secondary | ICD-10-CM | POA: Diagnosis not present

## 2020-12-31 DIAGNOSIS — R109 Unspecified abdominal pain: Secondary | ICD-10-CM | POA: Diagnosis not present

## 2020-12-31 DIAGNOSIS — U071 COVID-19: Secondary | ICD-10-CM | POA: Diagnosis not present

## 2020-12-31 DIAGNOSIS — R1110 Vomiting, unspecified: Secondary | ICD-10-CM | POA: Diagnosis not present

## 2021-01-03 DIAGNOSIS — E860 Dehydration: Secondary | ICD-10-CM | POA: Diagnosis not present

## 2021-01-03 DIAGNOSIS — U071 COVID-19: Secondary | ICD-10-CM | POA: Diagnosis not present

## 2021-01-04 DIAGNOSIS — F411 Generalized anxiety disorder: Secondary | ICD-10-CM | POA: Diagnosis not present
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
## 2021-01-05 ENCOUNTER — Ambulatory Visit (INDEPENDENT_AMBULATORY_CARE_PROVIDER_SITE_OTHER): Payer: BC Managed Care – PPO | Admitting: Physical Therapy

## 2021-01-05 ENCOUNTER — Ambulatory Visit (INDEPENDENT_AMBULATORY_CARE_PROVIDER_SITE_OTHER): Payer: BC Managed Care – PPO | Admitting: Family Medicine

## 2021-01-05 ENCOUNTER — Other Ambulatory Visit: Payer: Self-pay

## 2021-01-05 VITALS — BP 110/78 | HR 91 | Ht 68.0 in | Wt 176.0 lb

## 2021-01-05 DIAGNOSIS — M6281 Muscle weakness (generalized): Secondary | ICD-10-CM

## 2021-01-05 DIAGNOSIS — S060X0D Concussion without loss of consciousness, subsequent encounter: Secondary | ICD-10-CM

## 2021-01-05 DIAGNOSIS — R29898 Other symptoms and signs involving the musculoskeletal system: Secondary | ICD-10-CM | POA: Diagnosis not present

## 2021-01-05 DIAGNOSIS — R42 Dizziness and giddiness: Secondary | ICD-10-CM

## 2021-01-05 DIAGNOSIS — R262 Difficulty in walking, not elsewhere classified: Secondary | ICD-10-CM

## 2021-01-05 MED ORDER — NORTRIPTYLINE HCL 25 MG PO CAPS: 25.0000 mg | ORAL_CAPSULE | Freq: Every day | ORAL | 3 refills | Status: DC

## 2021-01-05 NOTE — Patient Instructions (Addendum)
Thank you for coming in today.  Ok to continue the nortriptyline at bedtime as needed.   Advance activity as tolerated.   Recheck as needed.   No flips.   Jiu-jitsu is ok now but avoid the [employer] ride on the back Aug 8 is OK.   Recheck as needed.

## 2021-01-05 NOTE — Therapy (Addendum)
[place], [place], [address] Phone: [phone]   Fax:  [phone]  Physical Therapy Treatment and Discharge  Patient Details  Name: [name] MRN: [id number] Date of Birth: 1991/06/05 Referring Provider (PT): Sherene Sires   Encounter Date: 01/05/2021   PT End of Session - 01/05/21 1138     Visit Number 15    Number of Visits 18    Date for PT Re-Evaluation 01/19/21    PT Start Time 1100    PT Stop Time 1140    PT Time Calculation (min) 40 min    Activity Tolerance Patient tolerated treatment well    Behavior During Therapy [facility] for tasks assessed/performed             Past Medical History:  Diagnosis Date   Animal bite of finger    a cat   Anxiety    Asthma    Bronchitis    Endometriosis    Insomnia    PCOS (polycystic ovarian syndrome)    Postgastrectomy malabsorption    PTSD (post-traumatic stress disorder)    Sleep apnea    Sleep disorder     Past Surgical History:  Procedure Laterality Date   CESAREAN SECTION N/A 01/20/2014   Procedure: CESAREAN SECTION;  Surgeon: Shelly Bombard, MD;  Location: [facility];  Service: Obstetrics;  Laterality: N/A;   GASTRIC BYPASS     MOUTH SURGERY     WISDOM TOOTH EXTRACTION      There were no vitals filed for this visit.   Subjective Assessment - 01/05/21 1101     Subjective Pt reports she had covid and was down for a few days but is feeling better. Pt has nerve conduction test in a few weeks and plans to return to work June 1.    Patient Stated Goals decrease pain and dizziness, return to work    Currently in Pain? No/denies                               [facility] Adult PT Treatment/Exercise - 01/05/21 0001       Ambulation/Gait   Gait Comments gait ladder jog, in/out forward and laterally, hop, jump      High Level Balance   High Level Balance Comments gait ladder jog with turn and catch ball quickly  from different directions, standing on trampoline ball toss from different directions, coordination box step 2 x 1 min alternating directions, lateral hopping x 10, A/P hopping x 10, jump and land on Rt LE on land and on foam, ball toss onto rebounder SLS 2 x 10                         PT Long Term Goals - 12/06/20 1012       PT LONG TERM GOAL #1   Title Pt will be independent with HEP    Time 6    Period Weeks    Status Achieved    Target Date 12/13/20      PT LONG TERM GOAL #2   Title Pt will improve FOTO to >= 64 to demo improved functional mobility    Baseline 63% on 12/06/20    Time 6    Period Weeks    Status Partially Met      PT LONG TERM GOAL #3   Title Pt will improve bilat LE  strength to 4+/5 to return to work with decreased pain    Time 6    Period Weeks    Status On-going      PT LONG TERM GOAL #4   Title Pt will tolerate standing > 30 minutes with pain <= 2/10    Time 6    Period Weeks    Status Achieved      PT LONG TERM GOAL #5   Title Pt will perform bed mobility without c/o dizziness    Time 6    Period Weeks    Status Achieved                   Plan - 01/05/21 1139     Clinical Impression Statement Pt continues to progress balance and coordination. Session focused on plyometrics and balance with much improvement in Rt LE coordination    PT Next Visit Plan coordination, balance    PT Home Exercise Plan Access Code: [facility]    Consulted and Agree with Plan of Care Patient             Patient will benefit from skilled therapeutic intervention in order to improve the following deficits and impairments:     Visit Diagnosis: Muscle weakness (generalized)  Other symptoms and signs involving the musculoskeletal system  Difficulty in walking, not elsewhere classified  Dizziness and giddiness     Problem List Patient Active Problem List   Diagnosis Date Noted   GAD (generalized anxiety disorder) 05/19/2020    Asthma exacerbation 04/27/2015   Thrombocytopenia ([place])    Shortness of breath    Essential hypertension    Asthma    Hypokalemia    Status post cesarean section 01/20/2014   Pregnancy 01/18/2014   Allergic rhinitis, seasonal 12/04/2013   Tobacco use disorder complicating pregnancy, childbirth, or the puerperium, antepartum condition or complication 71/29/2909   Rh negative state in antepartum period 06/13/2013   Obesity 06/11/2013   Smoker 06/11/2013  PHYSICAL THERAPY DISCHARGE SUMMARY  Visits from Start of Care: 15  Current functional level related to goals / functional outcomes: Decreased dizziness, improved strength and balance, decreased pain   Remaining deficits: See above   Education / Equipment: HEP   Patient agrees to discharge. Patient goals were met. Patient is being discharged due to being pleased with the current functional level. [name], PT,DPT06/24/2211:21 AM  [name], PT  [name] 01/05/2021, 11:40 AM  [facility] [place], [place], [address] Phone: [phone]   Fax:  [phone]  Name: [name] MRN: [id number] Date of Birth: 02/20/1991

## 2021-01-10 DIAGNOSIS — F411 Generalized anxiety disorder: Secondary | ICD-10-CM | POA: Diagnosis not present

## 2021-01-11 DIAGNOSIS — M545 Low back pain, unspecified: Secondary | ICD-10-CM | POA: Diagnosis not present

## 2021-01-11 DIAGNOSIS — R200 Anesthesia of skin: Secondary | ICD-10-CM | POA: Diagnosis not present

## 2021-01-13 DIAGNOSIS — F411 Generalized anxiety disorder: Secondary | ICD-10-CM | POA: Diagnosis not present

## 2021-01-19 ENCOUNTER — Ambulatory Visit: Payer: BC Managed Care – PPO | Admitting: Neurology

## 2021-01-19 IMAGING — CR DG CHEST 2V
2 series · 2 of 2 positions shown · non-contrast
Comparison: June 26, 2016.

CLINICAL DATA: Shortness of breath.

EXAM:
CHEST - 2 VIEW

[w chest pa]
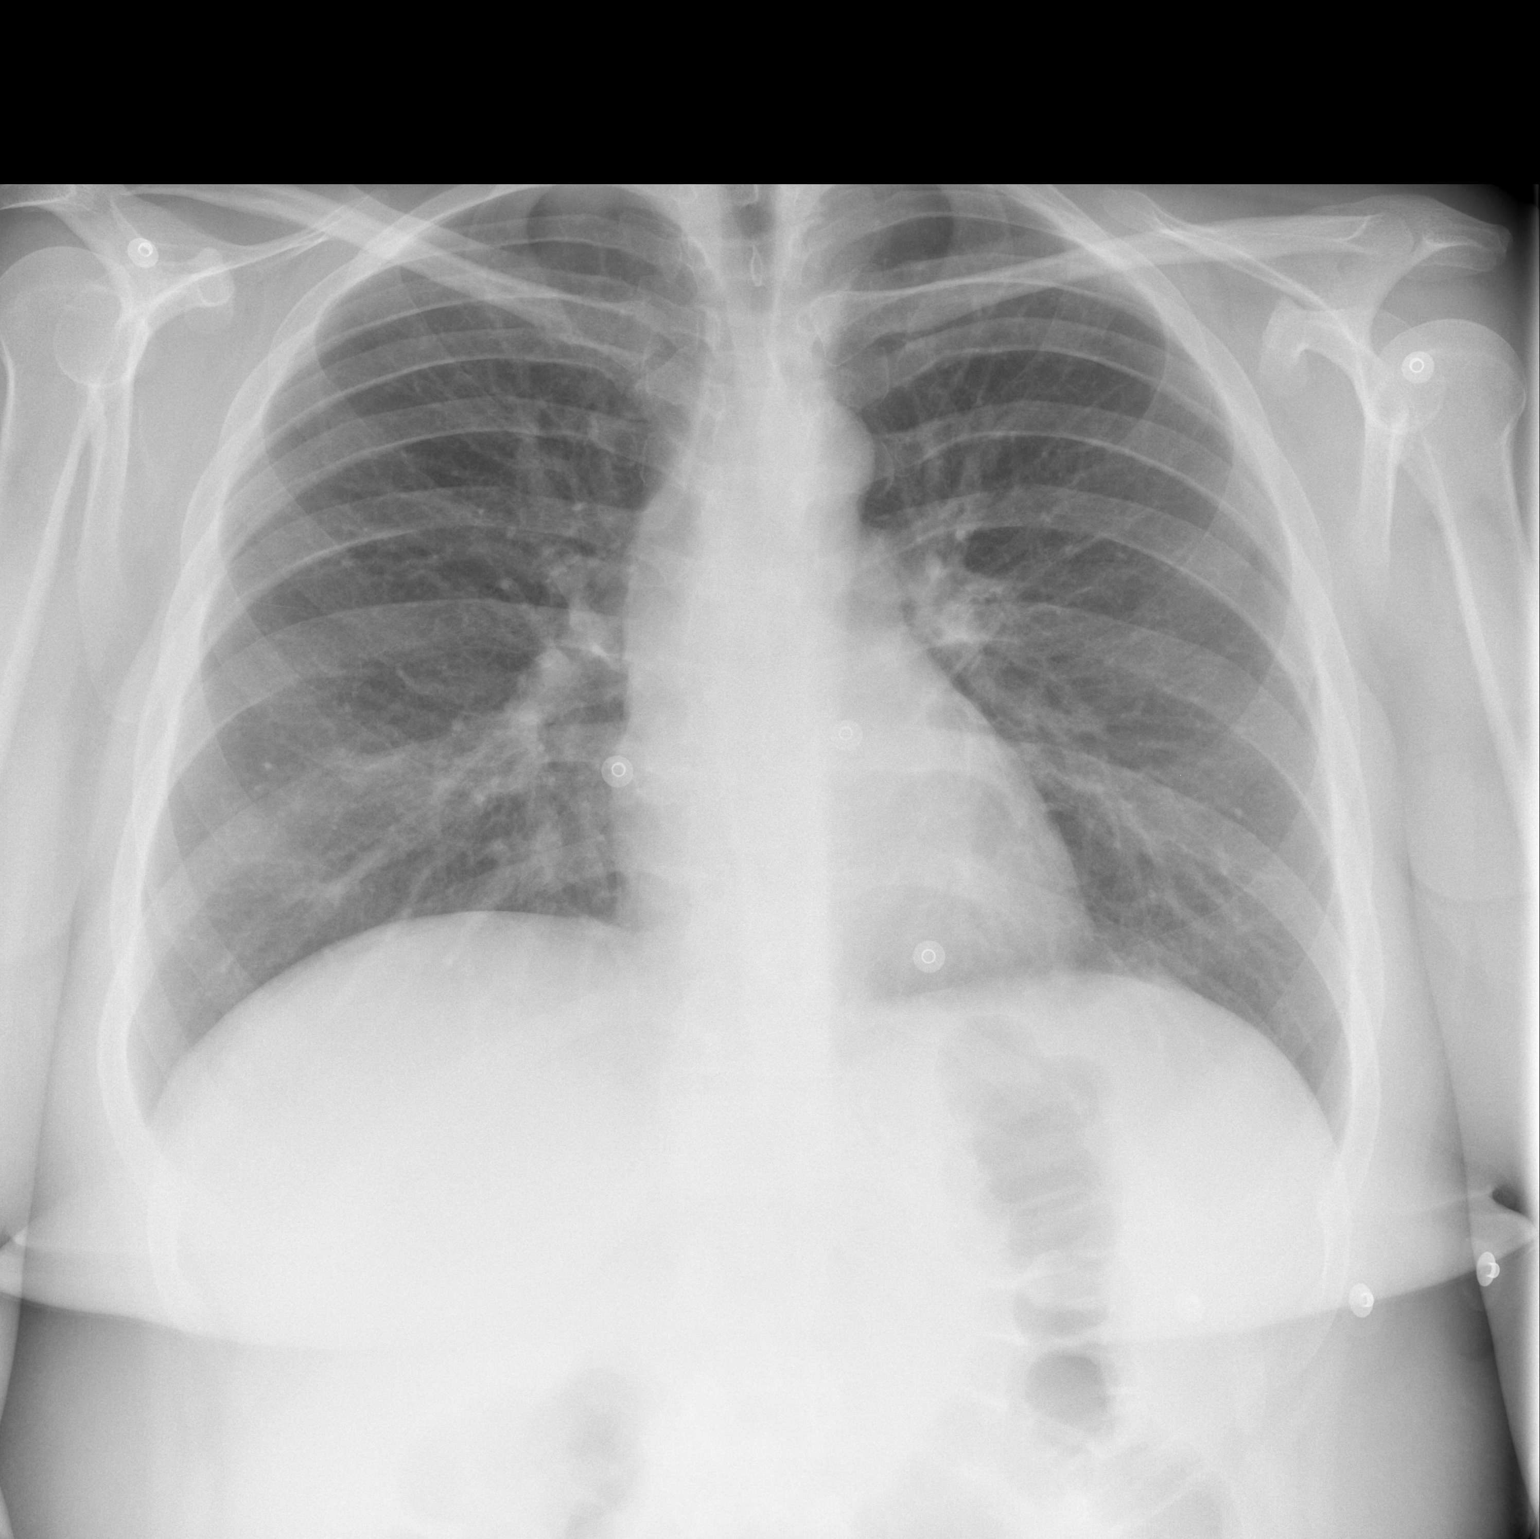

[w chest lat]
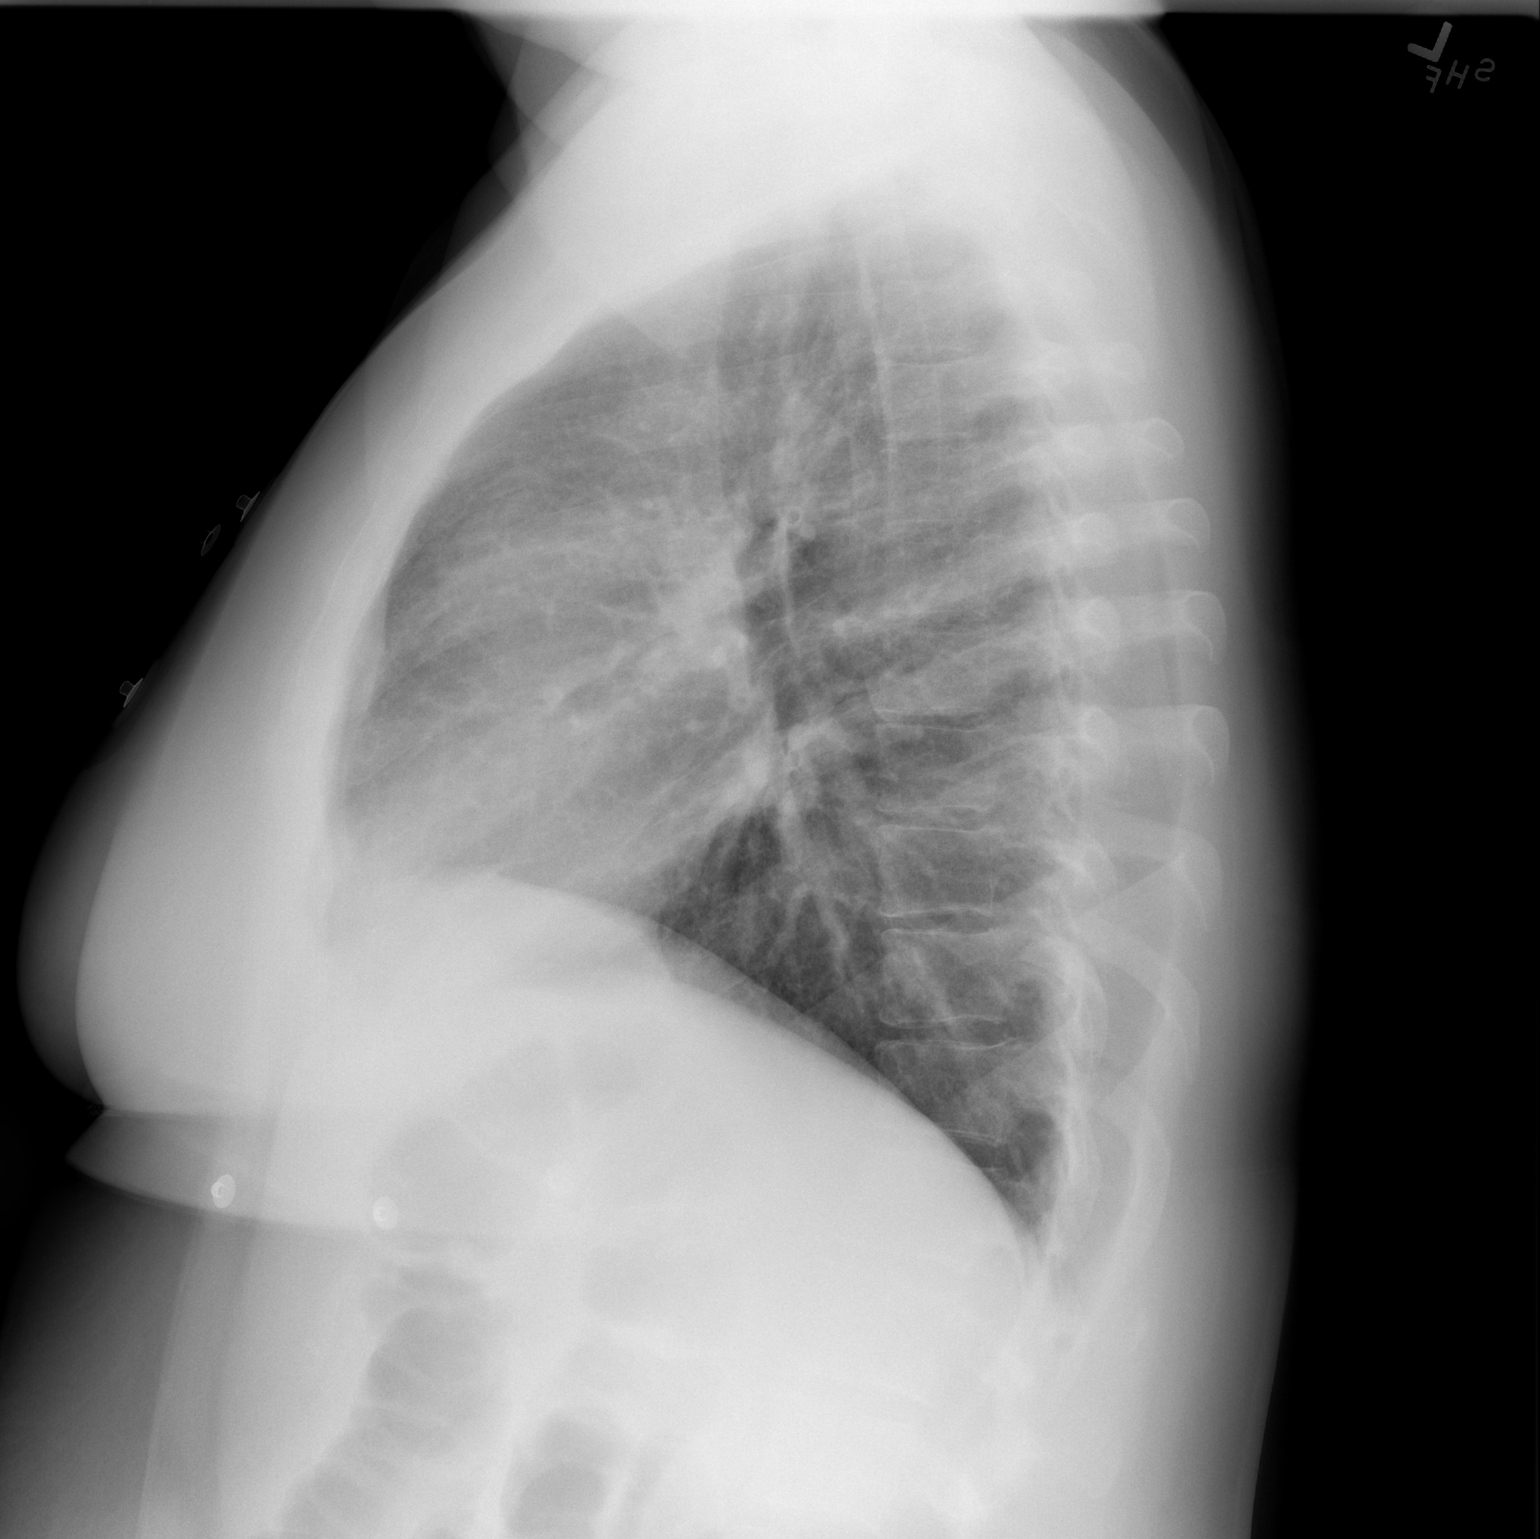

[2 of 2 positions shown; findings below may reference images not displayed]

FINDINGS: The heart size and mediastinal contours are within normal limits.
Both lungs are clear. No pneumothorax or pleural effusion is noted.
The visualized skeletal structures are unremarkable.
IMPRESSION: No active cardiopulmonary disease.

## 2021-01-21 DIAGNOSIS — F411 Generalized anxiety disorder: Secondary | ICD-10-CM | POA: Diagnosis not present

## 2021-01-27 DIAGNOSIS — F411 Generalized anxiety disorder: Secondary | ICD-10-CM | POA: Diagnosis not present

## 2021-01-28 ENCOUNTER — Encounter: Payer: Self-pay | Admitting: Physical Therapy

## 2021-01-28 DIAGNOSIS — R200 Anesthesia of skin: Secondary | ICD-10-CM | POA: Diagnosis not present

## 2021-02-04 ENCOUNTER — Encounter: Payer: BC Managed Care – PPO | Admitting: Physical Therapy

## 2021-02-11 ENCOUNTER — Encounter: Payer: BC Managed Care – PPO | Admitting: Physical Therapy

## 2021-02-16 DIAGNOSIS — N921 Excessive and frequent menstruation with irregular cycle: Secondary | ICD-10-CM | POA: Diagnosis not present

## 2021-02-16 DIAGNOSIS — N939 Abnormal uterine and vaginal bleeding, unspecified: Secondary | ICD-10-CM | POA: Diagnosis not present

## 2021-02-16 DIAGNOSIS — D252 Subserosal leiomyoma of uterus: Secondary | ICD-10-CM | POA: Diagnosis not present

## 2021-02-16 DIAGNOSIS — Z975 Presence of (intrauterine) contraceptive device: Secondary | ICD-10-CM | POA: Diagnosis not present

## 2021-02-16 DIAGNOSIS — N926 Irregular menstruation, unspecified: Secondary | ICD-10-CM | POA: Diagnosis not present

## 2021-02-18 DIAGNOSIS — F411 Generalized anxiety disorder: Secondary | ICD-10-CM | POA: Diagnosis not present

## 2021-02-24 NOTE — Progress Notes (Signed)
   I, [name], LAT, ATC, am serving as scribe for Dr. [name].  [name] is a 30 y.o. female who presents to [facility] today for R arm/elbow pain.  She was last seen by Dr. Denyse Amass on 01/05/21 for f/u of concussion.  Today, she notes R arm/elbow pain x approximately 6 weeks that she feels is more overuse-related after resuming working out/lifting.  She locates her pain to her R distal upper arm and proximal forearm across her elbow.  Radiating pain: yes from R distal upper arm to proximal forearm Aggravating factors: R elbow flexion / biceps curls; deltoid raises; pulling motions as in a pull-up Treatments tried: ice; heat; Gabapentin; rest;  Diagnostic testing: R forearm XR, R elbow XR- 10/15/20  Pertinent review of systems: no fever or chills  Relevant historical information: Recent concussion.  Getting back into weightlifting.   Exam:  BP 120/86 (BP Location: Left Arm, Patient Position: Sitting, Cuff Size: Normal)   Pulse 76   Ht 5\' 8"  (1.727 m)   Wt 167 lb (75.8 kg)   SpO2 96%   BMI 25.39 kg/m  General: Well Developed, well nourished, and in no acute distress.   MSK: Right elbow normal-appearing Normal motion. Tender palpation lateral epicondyle. Intact strength some pain with resisted elbow flexion. Pain with resisted wrist extension.  No pain with pronation supination wrist flexion.      Assessment and Plan: 30 y.o. female with right lateral elbow pain ongoing for few weeks now.  Pain thought to be predominantly due to lateral epicondylitis.  She may have a bit of elbow flexor tendinitis as well however this is not the dominant issue.  Plan for home exercise program taught in clinic today by ATC along with Voltaren gel.  If not improving will increase to formal physical therapy and had potential nitroglycerin patch protocol.  Like to avoid nitroglycerin patches given in history of headaches with recent concussion.  Recheck if  not improving in 6 weeks. Additionally recommended counterforce brace   Discussed warning signs or symptoms. Please see discharge instructions. Patient expresses understanding.   The above documentation has been reviewed and is accurate and complete 38, M.D.

## 2021-02-25 ENCOUNTER — Ambulatory Visit (INDEPENDENT_AMBULATORY_CARE_PROVIDER_SITE_OTHER): Payer: BC Managed Care – PPO | Admitting: Family Medicine

## 2021-02-25 ENCOUNTER — Encounter: Payer: Self-pay | Admitting: Family Medicine

## 2021-02-25 ENCOUNTER — Other Ambulatory Visit: Payer: Self-pay

## 2021-02-25 ENCOUNTER — Ambulatory Visit: Payer: Self-pay

## 2021-02-25 VITALS — BP 120/86 | HR 76 | Ht 68.0 in | Wt 167.0 lb

## 2021-02-25 DIAGNOSIS — M25521 Pain in right elbow: Secondary | ICD-10-CM | POA: Diagnosis not present

## 2021-02-25 DIAGNOSIS — M7711 Lateral epicondylitis, right elbow: Secondary | ICD-10-CM | POA: Diagnosis not present

## 2021-02-25 NOTE — Patient Instructions (Addendum)
Thank you for coming in today.   I think this is mostly tennis elbow and a little biceps.   Work on the home exercises. View at my-exercise-code.com using code: [email or web address]  Please use Voltaren gel (Generic Diclofenac Gel) up to 4x daily for pain as needed.  This is available over-the-counter as both the name brand Voltaren gel and the generic diclofenac gel.   Recheck in 6 weeks.

## 2021-02-28 DIAGNOSIS — K5730 Diverticulosis of large intestine without perforation or abscess without bleeding: Secondary | ICD-10-CM | POA: Diagnosis not present

## 2021-02-28 DIAGNOSIS — F411 Generalized anxiety disorder: Secondary | ICD-10-CM | POA: Diagnosis not present

## 2021-02-28 DIAGNOSIS — K2289 Other specified disease of esophagus: Secondary | ICD-10-CM | POA: Diagnosis not present

## 2021-02-28 DIAGNOSIS — Z980 Intestinal bypass and anastomosis status: Secondary | ICD-10-CM | POA: Diagnosis not present

## 2021-02-28 DIAGNOSIS — R1013 Epigastric pain: Secondary | ICD-10-CM | POA: Diagnosis not present

## 2021-02-28 DIAGNOSIS — R100 Acute abdomen: Secondary | ICD-10-CM | POA: Diagnosis not present

## 2021-02-28 DIAGNOSIS — F988 Other specified behavioral and emotional disorders with onset usually occurring in childhood and adolescence: Secondary | ICD-10-CM | POA: Diagnosis not present

## 2021-02-28 DIAGNOSIS — F330 Major depressive disorder, recurrent, mild: Secondary | ICD-10-CM | POA: Diagnosis not present

## 2021-02-28 DIAGNOSIS — F17210 Nicotine dependence, cigarettes, uncomplicated: Secondary | ICD-10-CM | POA: Diagnosis not present

## 2021-02-28 DIAGNOSIS — G4733 Obstructive sleep apnea (adult) (pediatric): Secondary | ICD-10-CM | POA: Diagnosis not present

## 2021-02-28 DIAGNOSIS — E785 Hyperlipidemia, unspecified: Secondary | ICD-10-CM | POA: Diagnosis not present

## 2021-02-28 DIAGNOSIS — Z79899 Other long term (current) drug therapy: Secondary | ICD-10-CM | POA: Diagnosis not present

## 2021-02-28 DIAGNOSIS — K56609 Unspecified intestinal obstruction, unspecified as to partial versus complete obstruction: Secondary | ICD-10-CM | POA: Diagnosis not present

## 2021-02-28 DIAGNOSIS — I10 Essential (primary) hypertension: Secondary | ICD-10-CM | POA: Diagnosis not present

## 2021-02-28 DIAGNOSIS — Z9884 Bariatric surgery status: Secondary | ICD-10-CM | POA: Diagnosis not present

## 2021-02-28 DIAGNOSIS — J4520 Mild intermittent asthma, uncomplicated: Secondary | ICD-10-CM | POA: Diagnosis not present

## 2021-02-28 DIAGNOSIS — K469 Unspecified abdominal hernia without obstruction or gangrene: Secondary | ICD-10-CM | POA: Diagnosis not present

## 2021-02-28 DIAGNOSIS — K6389 Other specified diseases of intestine: Secondary | ICD-10-CM | POA: Diagnosis not present

## 2021-02-28 DIAGNOSIS — Z87820 Personal history of traumatic brain injury: Secondary | ICD-10-CM | POA: Diagnosis not present

## 2021-02-28 DIAGNOSIS — K458 Other specified abdominal hernia without obstruction or gangrene: Secondary | ICD-10-CM | POA: Diagnosis not present

## 2021-02-28 DIAGNOSIS — Z885 Allergy status to narcotic agent status: Secondary | ICD-10-CM | POA: Diagnosis not present

## 2021-02-28 DIAGNOSIS — K9189 Other postprocedural complications and disorders of digestive system: Secondary | ICD-10-CM | POA: Diagnosis not present

## 2021-02-28 DIAGNOSIS — Z887 Allergy status to serum and vaccine status: Secondary | ICD-10-CM | POA: Diagnosis not present

## 2021-03-03 DIAGNOSIS — J45909 Unspecified asthma, uncomplicated: Secondary | ICD-10-CM | POA: Diagnosis not present

## 2021-03-03 DIAGNOSIS — F419 Anxiety disorder, unspecified: Secondary | ICD-10-CM | POA: Diagnosis not present

## 2021-03-03 DIAGNOSIS — R140 Abdominal distension (gaseous): Secondary | ICD-10-CM | POA: Diagnosis not present

## 2021-03-03 DIAGNOSIS — R1033 Periumbilical pain: Secondary | ICD-10-CM | POA: Diagnosis not present

## 2021-03-03 DIAGNOSIS — Z887 Allergy status to serum and vaccine status: Secondary | ICD-10-CM | POA: Diagnosis not present

## 2021-03-03 DIAGNOSIS — G8918 Other acute postprocedural pain: Secondary | ICD-10-CM | POA: Diagnosis not present

## 2021-03-03 DIAGNOSIS — Z885 Allergy status to narcotic agent status: Secondary | ICD-10-CM | POA: Diagnosis not present

## 2021-03-03 DIAGNOSIS — K429 Umbilical hernia without obstruction or gangrene: Secondary | ICD-10-CM | POA: Diagnosis not present

## 2021-03-03 DIAGNOSIS — M25512 Pain in left shoulder: Secondary | ICD-10-CM | POA: Diagnosis not present

## 2021-03-03 DIAGNOSIS — Z9889 Other specified postprocedural states: Secondary | ICD-10-CM | POA: Diagnosis not present

## 2021-03-03 DIAGNOSIS — Z79899 Other long term (current) drug therapy: Secondary | ICD-10-CM | POA: Diagnosis not present

## 2021-03-03 DIAGNOSIS — K5730 Diverticulosis of large intestine without perforation or abscess without bleeding: Secondary | ICD-10-CM | POA: Diagnosis not present

## 2021-03-03 DIAGNOSIS — R110 Nausea: Secondary | ICD-10-CM | POA: Diagnosis not present

## 2021-03-03 DIAGNOSIS — K6389 Other specified diseases of intestine: Secondary | ICD-10-CM | POA: Diagnosis not present

## 2021-03-03 DIAGNOSIS — F17210 Nicotine dependence, cigarettes, uncomplicated: Secondary | ICD-10-CM | POA: Diagnosis not present

## 2021-03-03 DIAGNOSIS — K760 Fatty (change of) liver, not elsewhere classified: Secondary | ICD-10-CM | POA: Diagnosis not present

## 2021-03-03 DIAGNOSIS — J9811 Atelectasis: Secondary | ICD-10-CM | POA: Diagnosis not present

## 2021-03-03 DIAGNOSIS — R059 Cough, unspecified: Secondary | ICD-10-CM | POA: Diagnosis not present

## 2021-03-11 DIAGNOSIS — F411 Generalized anxiety disorder: Secondary | ICD-10-CM | POA: Diagnosis not present

## 2021-04-08 DIAGNOSIS — S134XXS Sprain of ligaments of cervical spine, sequela: Secondary | ICD-10-CM | POA: Diagnosis not present

## 2021-04-08 DIAGNOSIS — M9901 Segmental and somatic dysfunction of cervical region: Secondary | ICD-10-CM | POA: Diagnosis not present

## 2021-04-08 DIAGNOSIS — M9902 Segmental and somatic dysfunction of thoracic region: Secondary | ICD-10-CM | POA: Diagnosis not present

## 2021-04-08 DIAGNOSIS — M9903 Segmental and somatic dysfunction of lumbar region: Secondary | ICD-10-CM | POA: Diagnosis not present

## 2021-04-08 DIAGNOSIS — M9905 Segmental and somatic dysfunction of pelvic region: Secondary | ICD-10-CM | POA: Diagnosis not present

## 2021-04-08 DIAGNOSIS — F411 Generalized anxiety disorder: Secondary | ICD-10-CM | POA: Diagnosis not present

## 2021-04-11 DIAGNOSIS — M9903 Segmental and somatic dysfunction of lumbar region: Secondary | ICD-10-CM | POA: Diagnosis not present

## 2021-04-11 DIAGNOSIS — M9905 Segmental and somatic dysfunction of pelvic region: Secondary | ICD-10-CM | POA: Diagnosis not present

## 2021-04-11 DIAGNOSIS — M9901 Segmental and somatic dysfunction of cervical region: Secondary | ICD-10-CM | POA: Diagnosis not present

## 2021-04-11 DIAGNOSIS — M9902 Segmental and somatic dysfunction of thoracic region: Secondary | ICD-10-CM | POA: Diagnosis not present

## 2021-04-13 DIAGNOSIS — M9905 Segmental and somatic dysfunction of pelvic region: Secondary | ICD-10-CM | POA: Diagnosis not present

## 2021-04-13 DIAGNOSIS — M9903 Segmental and somatic dysfunction of lumbar region: Secondary | ICD-10-CM | POA: Diagnosis not present

## 2021-04-13 DIAGNOSIS — M9901 Segmental and somatic dysfunction of cervical region: Secondary | ICD-10-CM | POA: Diagnosis not present

## 2021-04-13 DIAGNOSIS — M9902 Segmental and somatic dysfunction of thoracic region: Secondary | ICD-10-CM | POA: Diagnosis not present

## 2021-04-15 DIAGNOSIS — M9902 Segmental and somatic dysfunction of thoracic region: Secondary | ICD-10-CM | POA: Diagnosis not present

## 2021-04-15 DIAGNOSIS — M9903 Segmental and somatic dysfunction of lumbar region: Secondary | ICD-10-CM | POA: Diagnosis not present

## 2021-04-15 DIAGNOSIS — M9901 Segmental and somatic dysfunction of cervical region: Secondary | ICD-10-CM | POA: Diagnosis not present

## 2021-04-15 DIAGNOSIS — M9905 Segmental and somatic dysfunction of pelvic region: Secondary | ICD-10-CM | POA: Diagnosis not present

## 2021-04-18 DIAGNOSIS — M9905 Segmental and somatic dysfunction of pelvic region: Secondary | ICD-10-CM | POA: Diagnosis not present

## 2021-04-18 DIAGNOSIS — M9903 Segmental and somatic dysfunction of lumbar region: Secondary | ICD-10-CM | POA: Diagnosis not present

## 2021-04-18 DIAGNOSIS — M9902 Segmental and somatic dysfunction of thoracic region: Secondary | ICD-10-CM | POA: Diagnosis not present

## 2021-04-18 DIAGNOSIS — M9901 Segmental and somatic dysfunction of cervical region: Secondary | ICD-10-CM | POA: Diagnosis not present

## 2021-04-21 DIAGNOSIS — M9902 Segmental and somatic dysfunction of thoracic region: Secondary | ICD-10-CM | POA: Diagnosis not present

## 2021-04-21 DIAGNOSIS — M9903 Segmental and somatic dysfunction of lumbar region: Secondary | ICD-10-CM | POA: Diagnosis not present

## 2021-04-21 DIAGNOSIS — M9901 Segmental and somatic dysfunction of cervical region: Secondary | ICD-10-CM | POA: Diagnosis not present

## 2021-04-21 DIAGNOSIS — M9905 Segmental and somatic dysfunction of pelvic region: Secondary | ICD-10-CM | POA: Diagnosis not present

## 2021-04-28 DIAGNOSIS — M9903 Segmental and somatic dysfunction of lumbar region: Secondary | ICD-10-CM | POA: Diagnosis not present

## 2021-04-28 DIAGNOSIS — M9901 Segmental and somatic dysfunction of cervical region: Secondary | ICD-10-CM | POA: Diagnosis not present

## 2021-04-28 DIAGNOSIS — M9902 Segmental and somatic dysfunction of thoracic region: Secondary | ICD-10-CM | POA: Diagnosis not present

## 2021-04-28 DIAGNOSIS — M9905 Segmental and somatic dysfunction of pelvic region: Secondary | ICD-10-CM | POA: Diagnosis not present

## 2021-04-29 DIAGNOSIS — M9905 Segmental and somatic dysfunction of pelvic region: Secondary | ICD-10-CM | POA: Diagnosis not present

## 2021-04-29 DIAGNOSIS — M9903 Segmental and somatic dysfunction of lumbar region: Secondary | ICD-10-CM | POA: Diagnosis not present

## 2021-04-29 DIAGNOSIS — M9902 Segmental and somatic dysfunction of thoracic region: Secondary | ICD-10-CM | POA: Diagnosis not present

## 2021-04-29 DIAGNOSIS — M9901 Segmental and somatic dysfunction of cervical region: Secondary | ICD-10-CM | POA: Diagnosis not present

## 2021-05-02 DIAGNOSIS — M9905 Segmental and somatic dysfunction of pelvic region: Secondary | ICD-10-CM | POA: Diagnosis not present

## 2021-05-02 DIAGNOSIS — M9901 Segmental and somatic dysfunction of cervical region: Secondary | ICD-10-CM | POA: Diagnosis not present

## 2021-05-02 DIAGNOSIS — M9902 Segmental and somatic dysfunction of thoracic region: Secondary | ICD-10-CM | POA: Diagnosis not present

## 2021-05-02 DIAGNOSIS — M9903 Segmental and somatic dysfunction of lumbar region: Secondary | ICD-10-CM | POA: Diagnosis not present

## 2021-05-05 DIAGNOSIS — M9905 Segmental and somatic dysfunction of pelvic region: Secondary | ICD-10-CM | POA: Diagnosis not present

## 2021-05-05 DIAGNOSIS — M9903 Segmental and somatic dysfunction of lumbar region: Secondary | ICD-10-CM | POA: Diagnosis not present

## 2021-05-05 DIAGNOSIS — M9901 Segmental and somatic dysfunction of cervical region: Secondary | ICD-10-CM | POA: Diagnosis not present

## 2021-05-05 DIAGNOSIS — M9902 Segmental and somatic dysfunction of thoracic region: Secondary | ICD-10-CM | POA: Diagnosis not present

## 2021-05-06 DIAGNOSIS — M9901 Segmental and somatic dysfunction of cervical region: Secondary | ICD-10-CM | POA: Diagnosis not present

## 2021-05-06 DIAGNOSIS — M9902 Segmental and somatic dysfunction of thoracic region: Secondary | ICD-10-CM | POA: Diagnosis not present

## 2021-05-06 DIAGNOSIS — M9905 Segmental and somatic dysfunction of pelvic region: Secondary | ICD-10-CM | POA: Diagnosis not present

## 2021-05-06 DIAGNOSIS — R200 Anesthesia of skin: Secondary | ICD-10-CM | POA: Diagnosis not present

## 2021-05-06 DIAGNOSIS — M545 Low back pain, unspecified: Secondary | ICD-10-CM | POA: Diagnosis not present

## 2021-05-06 DIAGNOSIS — M25519 Pain in unspecified shoulder: Secondary | ICD-10-CM | POA: Diagnosis not present

## 2021-05-06 DIAGNOSIS — M9903 Segmental and somatic dysfunction of lumbar region: Secondary | ICD-10-CM | POA: Diagnosis not present

## 2021-05-11 DIAGNOSIS — M9901 Segmental and somatic dysfunction of cervical region: Secondary | ICD-10-CM | POA: Diagnosis not present

## 2021-05-11 DIAGNOSIS — M9902 Segmental and somatic dysfunction of thoracic region: Secondary | ICD-10-CM | POA: Diagnosis not present

## 2021-05-11 DIAGNOSIS — S134XXS Sprain of ligaments of cervical spine, sequela: Secondary | ICD-10-CM | POA: Diagnosis not present

## 2021-05-11 DIAGNOSIS — M9905 Segmental and somatic dysfunction of pelvic region: Secondary | ICD-10-CM | POA: Diagnosis not present

## 2021-05-11 DIAGNOSIS — M9903 Segmental and somatic dysfunction of lumbar region: Secondary | ICD-10-CM | POA: Diagnosis not present

## 2021-05-13 DIAGNOSIS — M9903 Segmental and somatic dysfunction of lumbar region: Secondary | ICD-10-CM | POA: Diagnosis not present

## 2021-05-13 DIAGNOSIS — M9901 Segmental and somatic dysfunction of cervical region: Secondary | ICD-10-CM | POA: Diagnosis not present

## 2021-05-13 DIAGNOSIS — M9905 Segmental and somatic dysfunction of pelvic region: Secondary | ICD-10-CM | POA: Diagnosis not present

## 2021-05-13 DIAGNOSIS — M9902 Segmental and somatic dysfunction of thoracic region: Secondary | ICD-10-CM | POA: Diagnosis not present

## 2021-05-16 ENCOUNTER — Other Ambulatory Visit: Payer: Self-pay | Admitting: Family Medicine

## 2021-05-18 DIAGNOSIS — M9901 Segmental and somatic dysfunction of cervical region: Secondary | ICD-10-CM | POA: Diagnosis not present

## 2021-05-18 DIAGNOSIS — M9903 Segmental and somatic dysfunction of lumbar region: Secondary | ICD-10-CM | POA: Diagnosis not present

## 2021-05-18 DIAGNOSIS — M9902 Segmental and somatic dysfunction of thoracic region: Secondary | ICD-10-CM | POA: Diagnosis not present

## 2021-05-18 DIAGNOSIS — M9905 Segmental and somatic dysfunction of pelvic region: Secondary | ICD-10-CM | POA: Diagnosis not present

## 2021-05-20 DIAGNOSIS — G8911 Acute pain due to trauma: Secondary | ICD-10-CM | POA: Diagnosis not present

## 2021-05-20 DIAGNOSIS — S299XXA Unspecified injury of thorax, initial encounter: Secondary | ICD-10-CM | POA: Diagnosis not present

## 2021-05-20 DIAGNOSIS — M9905 Segmental and somatic dysfunction of pelvic region: Secondary | ICD-10-CM | POA: Diagnosis not present

## 2021-05-20 DIAGNOSIS — M9902 Segmental and somatic dysfunction of thoracic region: Secondary | ICD-10-CM | POA: Diagnosis not present

## 2021-05-20 DIAGNOSIS — S9031XA Contusion of right foot, initial encounter: Secondary | ICD-10-CM | POA: Diagnosis not present

## 2021-05-20 DIAGNOSIS — W19XXXA Unspecified fall, initial encounter: Secondary | ICD-10-CM | POA: Diagnosis not present

## 2021-05-20 DIAGNOSIS — S99921A Unspecified injury of right foot, initial encounter: Secondary | ICD-10-CM | POA: Diagnosis not present

## 2021-05-20 DIAGNOSIS — R0781 Pleurodynia: Secondary | ICD-10-CM | POA: Diagnosis not present

## 2021-05-20 DIAGNOSIS — Z887 Allergy status to serum and vaccine status: Secondary | ICD-10-CM | POA: Diagnosis not present

## 2021-05-20 DIAGNOSIS — M9901 Segmental and somatic dysfunction of cervical region: Secondary | ICD-10-CM | POA: Diagnosis not present

## 2021-05-20 DIAGNOSIS — Z79899 Other long term (current) drug therapy: Secondary | ICD-10-CM | POA: Diagnosis not present

## 2021-05-20 DIAGNOSIS — M9903 Segmental and somatic dysfunction of lumbar region: Secondary | ICD-10-CM | POA: Diagnosis not present

## 2021-05-20 DIAGNOSIS — Z7951 Long term (current) use of inhaled steroids: Secondary | ICD-10-CM | POA: Diagnosis not present

## 2021-05-20 DIAGNOSIS — F17210 Nicotine dependence, cigarettes, uncomplicated: Secondary | ICD-10-CM | POA: Diagnosis not present

## 2021-05-20 DIAGNOSIS — Z888 Allergy status to other drugs, medicaments and biological substances status: Secondary | ICD-10-CM | POA: Diagnosis not present

## 2021-05-20 DIAGNOSIS — M79671 Pain in right foot: Secondary | ICD-10-CM | POA: Diagnosis not present

## 2021-05-20 DIAGNOSIS — J45909 Unspecified asthma, uncomplicated: Secondary | ICD-10-CM | POA: Diagnosis not present

## 2021-05-23 DIAGNOSIS — M9905 Segmental and somatic dysfunction of pelvic region: Secondary | ICD-10-CM | POA: Diagnosis not present

## 2021-05-23 DIAGNOSIS — M9903 Segmental and somatic dysfunction of lumbar region: Secondary | ICD-10-CM | POA: Diagnosis not present

## 2021-05-23 DIAGNOSIS — M9902 Segmental and somatic dysfunction of thoracic region: Secondary | ICD-10-CM | POA: Diagnosis not present

## 2021-05-23 DIAGNOSIS — M9901 Segmental and somatic dysfunction of cervical region: Secondary | ICD-10-CM | POA: Diagnosis not present

## 2021-05-25 DIAGNOSIS — F411 Generalized anxiety disorder: Secondary | ICD-10-CM | POA: Diagnosis not present

## 2021-06-01 DIAGNOSIS — J45909 Unspecified asthma, uncomplicated: Secondary | ICD-10-CM | POA: Diagnosis not present

## 2021-06-01 DIAGNOSIS — F32A Depression, unspecified: Secondary | ICD-10-CM | POA: Diagnosis not present

## 2021-06-01 DIAGNOSIS — F419 Anxiety disorder, unspecified: Secondary | ICD-10-CM | POA: Diagnosis not present

## 2021-06-01 DIAGNOSIS — R55 Syncope and collapse: Secondary | ICD-10-CM | POA: Diagnosis not present

## 2021-06-01 DIAGNOSIS — R42 Dizziness and giddiness: Secondary | ICD-10-CM | POA: Diagnosis not present

## 2021-06-01 DIAGNOSIS — Z793 Long term (current) use of hormonal contraceptives: Secondary | ICD-10-CM | POA: Diagnosis not present

## 2021-06-01 DIAGNOSIS — G4733 Obstructive sleep apnea (adult) (pediatric): Secondary | ICD-10-CM | POA: Diagnosis not present

## 2021-06-01 DIAGNOSIS — M9903 Segmental and somatic dysfunction of lumbar region: Secondary | ICD-10-CM | POA: Diagnosis not present

## 2021-06-01 DIAGNOSIS — B974 Respiratory syncytial virus as the cause of diseases classified elsewhere: Secondary | ICD-10-CM | POA: Diagnosis not present

## 2021-06-01 DIAGNOSIS — Z887 Allergy status to serum and vaccine status: Secondary | ICD-10-CM | POA: Diagnosis not present

## 2021-06-01 DIAGNOSIS — M9902 Segmental and somatic dysfunction of thoracic region: Secondary | ICD-10-CM | POA: Diagnosis not present

## 2021-06-01 DIAGNOSIS — Z79899 Other long term (current) drug therapy: Secondary | ICD-10-CM | POA: Diagnosis not present

## 2021-06-01 DIAGNOSIS — M9901 Segmental and somatic dysfunction of cervical region: Secondary | ICD-10-CM | POA: Diagnosis not present

## 2021-06-01 DIAGNOSIS — R531 Weakness: Secondary | ICD-10-CM | POA: Diagnosis not present

## 2021-06-01 DIAGNOSIS — Z885 Allergy status to narcotic agent status: Secondary | ICD-10-CM | POA: Diagnosis not present

## 2021-06-01 DIAGNOSIS — Z9181 History of falling: Secondary | ICD-10-CM | POA: Diagnosis not present

## 2021-06-01 DIAGNOSIS — R079 Chest pain, unspecified: Secondary | ICD-10-CM | POA: Diagnosis not present

## 2021-06-01 DIAGNOSIS — M9905 Segmental and somatic dysfunction of pelvic region: Secondary | ICD-10-CM | POA: Diagnosis not present

## 2021-06-01 DIAGNOSIS — F17210 Nicotine dependence, cigarettes, uncomplicated: Secondary | ICD-10-CM | POA: Diagnosis not present

## 2021-06-01 DIAGNOSIS — Z20822 Contact with and (suspected) exposure to covid-19: Secondary | ICD-10-CM | POA: Diagnosis not present

## 2021-06-01 DIAGNOSIS — Z9884 Bariatric surgery status: Secondary | ICD-10-CM | POA: Diagnosis not present

## 2021-06-02 DIAGNOSIS — Z9884 Bariatric surgery status: Secondary | ICD-10-CM | POA: Diagnosis not present

## 2021-06-02 DIAGNOSIS — R030 Elevated blood-pressure reading, without diagnosis of hypertension: Secondary | ICD-10-CM | POA: Diagnosis not present

## 2021-06-02 DIAGNOSIS — R079 Chest pain, unspecified: Secondary | ICD-10-CM | POA: Diagnosis not present

## 2021-06-02 DIAGNOSIS — R55 Syncope and collapse: Secondary | ICD-10-CM | POA: Diagnosis not present

## 2021-06-02 DIAGNOSIS — R002 Palpitations: Secondary | ICD-10-CM | POA: Diagnosis not present

## 2021-06-03 DIAGNOSIS — R200 Anesthesia of skin: Secondary | ICD-10-CM | POA: Diagnosis not present

## 2021-06-03 DIAGNOSIS — Z79891 Long term (current) use of opiate analgesic: Secondary | ICD-10-CM | POA: Diagnosis not present

## 2021-06-03 DIAGNOSIS — M25519 Pain in unspecified shoulder: Secondary | ICD-10-CM | POA: Diagnosis not present

## 2021-06-15 DIAGNOSIS — M9905 Segmental and somatic dysfunction of pelvic region: Secondary | ICD-10-CM | POA: Diagnosis not present

## 2021-06-15 DIAGNOSIS — M9902 Segmental and somatic dysfunction of thoracic region: Secondary | ICD-10-CM | POA: Diagnosis not present

## 2021-06-15 DIAGNOSIS — M9903 Segmental and somatic dysfunction of lumbar region: Secondary | ICD-10-CM | POA: Diagnosis not present

## 2021-06-15 DIAGNOSIS — M9901 Segmental and somatic dysfunction of cervical region: Secondary | ICD-10-CM | POA: Diagnosis not present

## 2021-06-16 DIAGNOSIS — F411 Generalized anxiety disorder: Secondary | ICD-10-CM | POA: Diagnosis not present

## 2021-06-20 DIAGNOSIS — J45909 Unspecified asthma, uncomplicated: Secondary | ICD-10-CM | POA: Diagnosis not present

## 2021-06-20 DIAGNOSIS — Z79899 Other long term (current) drug therapy: Secondary | ICD-10-CM | POA: Diagnosis not present

## 2021-06-20 DIAGNOSIS — Z9884 Bariatric surgery status: Secondary | ICD-10-CM | POA: Diagnosis not present

## 2021-06-20 DIAGNOSIS — Z885 Allergy status to narcotic agent status: Secondary | ICD-10-CM | POA: Diagnosis not present

## 2021-06-20 DIAGNOSIS — R062 Wheezing: Secondary | ICD-10-CM | POA: Diagnosis not present

## 2021-06-20 DIAGNOSIS — T63461A Toxic effect of venom of wasps, accidental (unintentional), initial encounter: Secondary | ICD-10-CM | POA: Diagnosis not present

## 2021-06-20 DIAGNOSIS — G4733 Obstructive sleep apnea (adult) (pediatric): Secondary | ICD-10-CM | POA: Diagnosis not present

## 2021-06-20 DIAGNOSIS — T782XXA Anaphylactic shock, unspecified, initial encounter: Secondary | ICD-10-CM | POA: Diagnosis not present

## 2021-06-20 DIAGNOSIS — Z91030 Bee allergy status: Secondary | ICD-10-CM | POA: Diagnosis not present

## 2021-06-20 DIAGNOSIS — Z793 Long term (current) use of hormonal contraceptives: Secondary | ICD-10-CM | POA: Diagnosis not present

## 2021-06-20 DIAGNOSIS — F419 Anxiety disorder, unspecified: Secondary | ICD-10-CM | POA: Diagnosis not present

## 2021-06-20 DIAGNOSIS — F17210 Nicotine dependence, cigarettes, uncomplicated: Secondary | ICD-10-CM | POA: Diagnosis not present

## 2021-06-20 DIAGNOSIS — T63441A Toxic effect of venom of bees, accidental (unintentional), initial encounter: Secondary | ICD-10-CM | POA: Diagnosis not present

## 2021-06-20 DIAGNOSIS — R0602 Shortness of breath: Secondary | ICD-10-CM | POA: Diagnosis not present

## 2021-06-20 DIAGNOSIS — Z887 Allergy status to serum and vaccine status: Secondary | ICD-10-CM | POA: Diagnosis not present

## 2021-06-22 DIAGNOSIS — M9901 Segmental and somatic dysfunction of cervical region: Secondary | ICD-10-CM | POA: Diagnosis not present

## 2021-06-22 DIAGNOSIS — S134XXS Sprain of ligaments of cervical spine, sequela: Secondary | ICD-10-CM | POA: Diagnosis not present

## 2021-06-22 DIAGNOSIS — M9905 Segmental and somatic dysfunction of pelvic region: Secondary | ICD-10-CM | POA: Diagnosis not present

## 2021-06-22 DIAGNOSIS — M9902 Segmental and somatic dysfunction of thoracic region: Secondary | ICD-10-CM | POA: Diagnosis not present

## 2021-07-04 DIAGNOSIS — R002 Palpitations: Secondary | ICD-10-CM | POA: Diagnosis not present

## 2021-07-04 DIAGNOSIS — R55 Syncope and collapse: Secondary | ICD-10-CM | POA: Diagnosis not present

## 2021-07-11 DIAGNOSIS — R051 Acute cough: Secondary | ICD-10-CM | POA: Diagnosis not present

## 2021-07-11 DIAGNOSIS — M791 Myalgia, unspecified site: Secondary | ICD-10-CM | POA: Diagnosis not present

## 2021-07-11 DIAGNOSIS — J101 Influenza due to other identified influenza virus with other respiratory manifestations: Secondary | ICD-10-CM | POA: Diagnosis not present

## 2021-07-11 DIAGNOSIS — Z20822 Contact with and (suspected) exposure to covid-19: Secondary | ICD-10-CM | POA: Diagnosis not present

## 2021-07-11 DIAGNOSIS — Z20828 Contact with and (suspected) exposure to other viral communicable diseases: Secondary | ICD-10-CM | POA: Diagnosis not present
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
## 2021-07-20 DIAGNOSIS — S134XXS Sprain of ligaments of cervical spine, sequela: Secondary | ICD-10-CM | POA: Diagnosis not present

## 2021-07-20 DIAGNOSIS — M9903 Segmental and somatic dysfunction of lumbar region: Secondary | ICD-10-CM | POA: Diagnosis not present

## 2021-07-20 DIAGNOSIS — M9902 Segmental and somatic dysfunction of thoracic region: Secondary | ICD-10-CM | POA: Diagnosis not present

## 2021-07-20 DIAGNOSIS — M9905 Segmental and somatic dysfunction of pelvic region: Secondary | ICD-10-CM | POA: Diagnosis not present

## 2021-07-20 DIAGNOSIS — M9901 Segmental and somatic dysfunction of cervical region: Secondary | ICD-10-CM | POA: Diagnosis not present

## 2021-07-27 DIAGNOSIS — M25519 Pain in unspecified shoulder: Secondary | ICD-10-CM | POA: Diagnosis not present

## 2021-07-27 DIAGNOSIS — M545 Low back pain, unspecified: Secondary | ICD-10-CM | POA: Diagnosis not present

## 2021-07-27 DIAGNOSIS — R200 Anesthesia of skin: Secondary | ICD-10-CM | POA: Diagnosis not present

## 2021-07-27 DIAGNOSIS — M7918 Myalgia, other site: Secondary | ICD-10-CM | POA: Diagnosis not present

## 2021-08-01 DIAGNOSIS — F411 Generalized anxiety disorder: Secondary | ICD-10-CM | POA: Diagnosis not present

## 2022-06-05 ENCOUNTER — Other Ambulatory Visit: Payer: Self-pay

## 2022-06-05 ENCOUNTER — Emergency Department (HOSPITAL_BASED_OUTPATIENT_CLINIC_OR_DEPARTMENT_OTHER)
Admission: EM | Admit: 2022-06-05 | Discharge: 2022-06-05 | Disposition: A | Discharge status: Home / Self Care | Payer: BC Managed Care – PPO | Attending: Emergency Medicine | Admitting: Emergency Medicine

## 2022-06-05 ENCOUNTER — Encounter (HOSPITAL_BASED_OUTPATIENT_CLINIC_OR_DEPARTMENT_OTHER): Payer: Self-pay | Admitting: Emergency Medicine

## 2022-06-05 DIAGNOSIS — X58XXXA Exposure to other specified factors, initial encounter: Secondary | ICD-10-CM | POA: Insufficient documentation

## 2022-06-05 DIAGNOSIS — T2662XA Corrosion of cornea and conjunctival sac, left eye, initial encounter: Secondary | ICD-10-CM | POA: Insufficient documentation

## 2022-06-05 DIAGNOSIS — T65891A Toxic effect of other specified substances, accidental (unintentional), initial encounter: Secondary | ICD-10-CM | POA: Insufficient documentation

## 2022-06-05 DIAGNOSIS — H10212 Acute toxic conjunctivitis, left eye: Secondary | ICD-10-CM

## 2022-06-05 DIAGNOSIS — Y92009 Unspecified place in unspecified non-institutional (private) residence as the place of occurrence of the external cause: Secondary | ICD-10-CM | POA: Insufficient documentation

## 2022-06-05 MED ORDER — FLUORESCEIN SODIUM 1 MG OP STRP
1.0000 | ORAL_STRIP | Freq: Once | OPHTHALMIC | Status: DC
  Filled 2022-06-05: qty 1

## 2022-06-05 MED ORDER — ONDANSETRON 4 MG PO TBDP
4.0000 mg | ORAL_TABLET | Freq: Once | ORAL | Status: AC
  Administered 2022-06-05: 4 mg via ORAL
  Filled 2022-06-05: qty 1

## 2022-06-05 MED ORDER — TETRACAINE HCL 0.5 % OP SOLN
2.0000 [drp] | Freq: Once | OPHTHALMIC | Status: AC
  Administered 2022-06-05: 2 [drp] via OPHTHALMIC
  Filled 2022-06-05: qty 4

## 2022-06-05 MED ORDER — TOBRAMYCIN 0.3 % OP SOLN: 1.0000 [drp] | OPHTHALMIC | 0 refills | Status: AC

## 2022-06-05 MED ORDER — OXYCODONE-ACETAMINOPHEN 5-325 MG PO TABS
2.0000 | ORAL_TABLET | Freq: Once | ORAL | Status: AC
  Administered 2022-06-05: 2 via ORAL
  Filled 2022-06-05: qty 2

## 2022-06-05 MED ORDER — TETRACAINE HCL 0.5 % OP SOLN
2.0000 [drp] | Freq: Once | OPHTHALMIC | Status: DC
  Filled 2022-06-05: qty 4

## 2022-06-05 NOTE — ED Provider Triage Note (Signed)
Emergency Medicine Provider Triage Evaluation Note  [name] , a 31 y.o. female  was evaluated in triage.  Pt complains of left eye pain.  Pt put in a contact that had soaked in peroxide.   Review of Systems  Positive:  Negative:   Physical Exam  BP (!) 154/103 (BP Location: Right Arm)   Pulse 75   Temp 97.8 F (36.6 C)   Resp 18   LMP 05/15/2022 (Approximate)   SpO2 99%  Gen:   Awake, no distress   Resp:  Normal effort  MSK:   Moves extremities without difficulty  Other:  PERRLA EOmi intact    Medical Decision Making  Medically screening exam initiated at 6:25 PM.  Appropriate orders placed.  [name] was informed that the remainder of the evaluation will be completed by another provider, this initial triage assessment does not replace that evaluation, and the importance of remaining in the ED until their evaluation is complete.  Tetracaine    Fransico Meadow, [place] 06/05/22 1826
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
## 2022-06-05 NOTE — Discharge Instructions (Addendum)
Follow up with  your eye doctor for recheck.
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
## 2022-08-31 ENCOUNTER — Emergency Department (HOSPITAL_COMMUNITY)
Admission: EM | Admit: 2022-08-31 | Discharge: 2022-08-31 | Disposition: A | Discharge status: Home / Self Care | Payer: No Typology Code available for payment source | Attending: Emergency Medicine | Admitting: Emergency Medicine

## 2022-08-31 ENCOUNTER — Encounter (HOSPITAL_COMMUNITY): Payer: Self-pay

## 2022-08-31 ENCOUNTER — Emergency Department (HOSPITAL_COMMUNITY): Payer: No Typology Code available for payment source

## 2022-08-31 ENCOUNTER — Other Ambulatory Visit: Payer: Self-pay

## 2022-08-31 DIAGNOSIS — Z23 Encounter for immunization: Secondary | ICD-10-CM | POA: Diagnosis not present

## 2022-08-31 DIAGNOSIS — Z2914 Encounter for prophylactic rabies immune globin: Secondary | ICD-10-CM | POA: Diagnosis not present

## 2022-08-31 DIAGNOSIS — W5501XA Bitten by cat, initial encounter: Secondary | ICD-10-CM | POA: Insufficient documentation

## 2022-08-31 DIAGNOSIS — S61451A Open bite of right hand, initial encounter: Secondary | ICD-10-CM | POA: Diagnosis not present

## 2022-08-31 DIAGNOSIS — S6991XA Unspecified injury of right wrist, hand and finger(s), initial encounter: Secondary | ICD-10-CM | POA: Diagnosis present

## 2022-08-31 DIAGNOSIS — W5503XA Scratched by cat, initial encounter: Secondary | ICD-10-CM

## 2022-08-31 MED ORDER — AZITHROMYCIN 250 MG PO TABS
500.0000 mg | ORAL_TABLET | Freq: Once | ORAL | Status: AC
  Administered 2022-08-31: 500 mg via ORAL
  Filled 2022-08-31: qty 2

## 2022-08-31 MED ORDER — AZITHROMYCIN 250 MG PO TABS: 250.0000 mg | ORAL_TABLET | Freq: Every day | ORAL | 0 refills | Status: DC

## 2022-08-31 MED ORDER — RABIES IMMUNE GLOBULIN 150 UNIT/ML IM INJ: 20.0000 [IU]/kg | INJECTION | Freq: Once | INTRAMUSCULAR | Status: DC

## 2022-08-31 MED ORDER — ACETAMINOPHEN 500 MG PO TABS: 500.0000 mg | ORAL_TABLET | Freq: Four times a day (QID) | ORAL | 0 refills | Status: DC | PRN

## 2022-08-31 MED ORDER — ONDANSETRON 4 MG PO TBDP
4.0000 mg | ORAL_TABLET | Freq: Once | ORAL | Status: AC
  Administered 2022-08-31: 4 mg via ORAL
  Filled 2022-08-31: qty 1

## 2022-08-31 MED ORDER — ACETAMINOPHEN 500 MG PO TABS
1000.0000 mg | ORAL_TABLET | Freq: Once | ORAL | Status: AC
  Administered 2022-08-31: 1000 mg via ORAL
  Filled 2022-08-31: qty 2

## 2022-08-31 MED ORDER — RABIES VACCINE, PCEC IM SUSR
1.0000 mL | Freq: Once | INTRAMUSCULAR | Status: AC
  Administered 2022-08-31: 1 mL via INTRAMUSCULAR
  Filled 2022-08-31: qty 1

## 2022-08-31 MED ORDER — AMOXICILLIN-POT CLAVULANATE 875-125 MG PO TABS: 1.0000 | ORAL_TABLET | Freq: Two times a day (BID) | ORAL | 0 refills | Status: DC

## 2022-08-31 MED ORDER — AZITHROMYCIN 250 MG PO TABS: ORAL_TABLET | ORAL | 0 refills | Status: DC

## 2022-08-31 MED ORDER — FLUCONAZOLE 150 MG PO TABS: 150.0000 mg | ORAL_TABLET | Freq: Every day | ORAL | 0 refills | Status: AC

## 2022-08-31 MED ORDER — AMOXICILLIN-POT CLAVULANATE 875-125 MG PO TABS
1.0000 | ORAL_TABLET | Freq: Once | ORAL | Status: AC
  Administered 2022-08-31: 1 via ORAL
  Filled 2022-08-31: qty 1

## 2022-08-31 MED ORDER — RABIES IMMUNE GLOBULIN 300 UNIT/2ML IJ SOLN
1575.0000 [IU] | Freq: Once | INTRAMUSCULAR | Status: AC
  Administered 2022-08-31: 1575 [IU]
  Filled 2022-08-31: qty 10

## 2022-08-31 NOTE — ED Provider Notes (Signed)
[name]   CSN: [id number] Arrival date & time: 08/31/22  2005     History  Chief Complaint  Patient presents with   Animal Bite    [name] is a 32 y.o. female presenting with cat scratches and bites to the right hand.  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  Reports that the cat has bite marks from another animal as well.  Declines tetanus shot because she is anaphylactic from it.  She believes it is up-to-date anyway secondary to her job.  No antibiotic allergies.   Animal Bite      Home Medications Prior to Admission medications   Medication Sig Start Date End Date Taking? Authorizing Provider  acetaminophen (TYLENOL) 500 MG tablet Take 1 tablet (500 mg total) by mouth every 6 (six) hours as needed. 08/31/22  Yes Piper Albro A, PA-C  amoxicillin-clavulanate (AUGMENTIN) 875-125 MG tablet Take 1 tablet by mouth every 12 (twelve) hours. 08/31/22  Yes Rosey Eide A, PA-C  acetaminophen (TYLENOL) 500 MG tablet Take 1,000 mg by mouth every 6 (six) hours as needed for mild pain, moderate pain or headache.    [provider]  amphetamine-dextroamphetamine (ADDERALL XR) 20 MG 24 hr capsule Take by mouth. 08/11/20   [provider]  baclofen (LIORESAL) 20 MG tablet Take 1 tablet by mouth at bedtime as needed. 06/27/19   [provider]  Calcium Citrate-Vitamin D 250-200 MG-UNIT TABS Take by mouth.    [provider]  etonogestrel-ethinyl estradiol (NUVARING) 0.12-0.015 MG/24HR vaginal ring Place 1 each vaginally every 28 (twenty-eight) days. Insert vaginally and leave in place for 3 consecutive weeks, then remove for 1 week.    [provider]  gabapentin (NEURONTIN) 300 MG capsule Take 1 capsule (300 mg total) by mouth 3 (three) times daily for 5 days. 10/15/20 10/20/20  Christy Gentles, MD  lidocaine (LIDODERM) 5 %  Place 1 patch onto the skin daily. Remove & Discard patch within 12 hours or as directed by MD 10/15/20   Christy Gentles, MD  nortriptyline (PAMELOR) 25 MG capsule Take 1 capsule by mouth at bedtime 05/17/21   Gregor Hams, MD  ondansetron (ZOFRAN ODT) 4 MG disintegrating tablet Take 1 tablet (4 mg total) by mouth every 8 (eight) hours as needed for nausea or vomiting. 10/17/20   Petrucelli, Aldona Bar R, PA-C  sertraline (ZOLOFT) 100 MG tablet Take 100 mg by mouth daily.    [provider]  albuterol (VENTOLIN HFA) 108 (90 Base) MCG/ACT inhaler Inhale 2 puffs into the lungs every 4 (four) hours as needed for wheezing or shortness of breath. Patient not taking: Reported on 11/02/2020 04/28/20 11/09/20  Horton, Barbette Hair, MD  levalbuterol [facility] HFA) 45 MCG/ACT inhaler Inhale 1-2 puffs into the lungs every 6 (six) hours as needed for wheezing or shortness of breath. Patient not taking: Reported on 11/02/2020  11/09/20  [provider]  potassium chloride (K-DUR) 10 MEQ tablet Take 1 tablet (10 mEq total) by mouth daily. Patient not taking: Reported on 08/16/2015 04/27/15 08/16/15  Robbie Lis, MD  potassium chloride SA (KLOR-CON) 20 MEQ tablet Take 1 tablet (20 mEq total) by mouth 2 (two) times daily. Patient not taking: Reported on 11/02/2020 04/28/20 11/09/20  Merryl Hacker, MD      Allergies    Tetanus-diphth-acell pertussis and Morphine and related    Review of Systems  Review of Systems  Physical Exam Updated Vital Signs BP (!) 174/105 (BP Location: Left Arm)   Pulse 100   Temp 98.3 F (36.8 C) (Oral)   Resp 18   Ht 5\' 8"  (1.727 m)   Wt 77.1 kg   SpO2 100%   BMI 25.85 kg/m  Physical Exam Vitals and nursing [name] reviewed.  Constitutional:      General: She is not in acute distress.    Appearance: Normal appearance. She is not ill-appearing.  HENT:     Head: Normocephalic and atraumatic.  Eyes:     General: No scleral icterus.    Conjunctiva/sclera: Conjunctivae  normal.  Pulmonary:     Effort: Pulmonary effort is normal. No respiratory distress.  Skin:    General: Skin is warm and dry.     Findings: No rash.     Comments: 5 puncture wounds on the dorsal surface of patient's right hand.  Some scratches over the dorsal surface of the thumb.  No bleeding.  Full range of motion of the digits.  Strong radial pulse.  Neurological:     Mental Status: She is alert.  Psychiatric:        Mood and Affect: Mood normal.     ED Results / Procedures / Treatments   Labs (all labs ordered are listed, but only abnormal results are displayed) Labs Reviewed - No data to display  EKG None  Radiology No results found.  Procedures Procedures   Medications Ordered in ED Medications  rabies vaccine (RABAVERT) injection 1 mL (has no administration in time range)  azithromycin (ZITHROMAX) tablet 500 mg (has no administration in time range)  rabies immune globulin (HYPERRAB/KEDRAB) 1,575 Units (has no administration in time range)  amoxicillin-clavulanate (AUGMENTIN) 875-125 MG per tablet 1 tablet (1 tablet Oral Given 08/31/22 2042)  acetaminophen (TYLENOL) tablet 1,000 mg (1,000 mg Oral Given 08/31/22 2041)    ED Course/ Medical Decision Making/ A&P                           Medical Decision Making Amount and/or Complexity of Data Reviewed Radiology: ordered.  Risk OTC drugs. Prescription drug management.    Xray ordered by request. I viewed and interpreted this and agree that there are no abnormalities.  Treatment: Antibiotics, Tylenol, rabies series initiated  MDM/disposition: 32 year old female presenting today after being attacked by a feral cat.  Reports tetanus is up-to-date but also declines any shot because she is allergic.  No antibiotic allergies.  Will be sent with Augmentin and azithro (for cat scratch) and follow-up rabies instructions.   Final Clinical Impression(s) / ED Diagnoses Final diagnoses:  Cat bite, initial encounter   Cat scratch    Rx / DC Orders ED Discharge Orders          Ordered    amoxicillin-clavulanate (AUGMENTIN) 875-125 MG tablet  Every 12 hours        08/31/22 2027    acetaminophen (TYLENOL) 500 MG tablet  Every 6 hours PRN        08/31/22 2027    fluconazole (DIFLUCAN) 150 MG tablet  Daily        08/31/22 2028    azithromycin (ZITHROMAX) 250 MG tablet  Daily        08/31/22 2033           Results and diagnoses were explained to the patient. Return precautions discussed in full. Patient had no additional questions and expressed complete  understanding.   This chart was dictated using voice recognition software.  Despite best efforts to proofread,  errors can occur which can change the documentation meaning.    Darliss Ridgel 08/31/22 2106    Davonna Belling, MD 08/31/22 [phone]

## 2022-08-31 NOTE — Discharge Instructions (Addendum)
You came to the emergency department today after being bitten and scratched by a cat.  You have been started on 2 antibiotics, the first is called Augmentin that covers for bites and the second is called azithromycin that is used for cat scratches.  You may start these tomorrow morning.  I have sent Diflucan to your pharmacy for you to use after these antibiotics if you feel you have developed a yeast infection.  Please follow the rabies series that is attached to these discharge papers.  Do not hesitate to return with any further concerns or worsening symptoms.

## 2022-08-31 NOTE — ED Triage Notes (Signed)
Pt was attacked by a cat at her job today. Cat was wild, not vaccinated. Pt was bit 6 times on her right arm.

## 2022-09-03 ENCOUNTER — Encounter (HOSPITAL_COMMUNITY): Payer: Self-pay | Admitting: Emergency Medicine

## 2022-09-03 ENCOUNTER — Emergency Department (HOSPITAL_COMMUNITY)
Admission: EM | Admit: 2022-09-03 | Discharge: 2022-09-03 | Disposition: A | Discharge status: Home / Self Care | Payer: No Typology Code available for payment source | Attending: Emergency Medicine | Admitting: Emergency Medicine

## 2022-09-03 DIAGNOSIS — Z23 Encounter for immunization: Secondary | ICD-10-CM | POA: Diagnosis not present

## 2022-09-03 DIAGNOSIS — R21 Rash and other nonspecific skin eruption: Secondary | ICD-10-CM | POA: Diagnosis present

## 2022-09-03 LAB — I-STAT BETA HCG BLOOD, ED (MC, WL, AP ONLY): I-stat hCG, quantitative: <5 m[IU]/mL (ref <5)

## 2022-09-03 MED ORDER — RABIES VACCINE, PCEC IM SUSR
1.0000 mL | Freq: Once | INTRAMUSCULAR | Status: AC
  Administered 2022-09-03: 1 mL via INTRAMUSCULAR
  Filled 2022-09-03: qty 1

## 2022-09-03 MED ORDER — PREDNISONE 20 MG PO TABS
60.0000 mg | ORAL_TABLET | Freq: Once | ORAL | Status: AC
  Administered 2022-09-03: 60 mg via ORAL
  Filled 2022-09-03: qty 3

## 2022-09-03 MED ORDER — HYDROXYZINE HCL 25 MG PO TABS
25.0000 mg | ORAL_TABLET | Freq: Once | ORAL | Status: AC
  Administered 2022-09-03: 25 mg via ORAL
  Filled 2022-09-03: qty 1

## 2022-09-03 MED ORDER — DIPHENHYDRAMINE HCL 25 MG PO CAPS
25.0000 mg | ORAL_CAPSULE | Freq: Once | ORAL | Status: AC
  Administered 2022-09-03: 25 mg via ORAL
  Filled 2022-09-03: qty 1

## 2022-09-03 MED ORDER — PREDNISONE 20 MG PO TABS: 20.0000 mg | ORAL_TABLET | Freq: Every day | ORAL | 0 refills | Status: AC

## 2022-09-03 MED ORDER — FAMOTIDINE 20 MG PO TABS
20.0000 mg | ORAL_TABLET | Freq: Once | ORAL | Status: AC
  Administered 2022-09-03: 20 mg via ORAL
  Filled 2022-09-03: qty 1

## 2022-09-03 MED ORDER — HYDROXYZINE HCL 25 MG PO TABS: 25.0000 mg | ORAL_TABLET | Freq: Four times a day (QID) | ORAL | 0 refills | Status: DC | PRN

## 2022-09-03 MED ORDER — OMEPRAZOLE 20 MG PO CPDR: 20.0000 mg | DELAYED_RELEASE_CAPSULE | Freq: Every day | ORAL | 0 refills | Status: AC

## 2022-09-03 NOTE — ED Provider Notes (Signed)
Emergency Department Provider Note   I have reviewed the triage vital signs and the nursing notes.   HISTORY  Chief Complaint No chief complaint on file.   HPI [name] is a 32 y.o. female with past history reviewed below including asthma and sensitive skin presents to the emergency department with itchy rash over her body.  Symptoms have been present for the past several weeks.  She had a brief interruption in her Lamictal dose and restarted the medication.  She noticed a small patch of itchy rash on her lower abdomen.  She required rabies vaccination and immunoglobulin after being bitten by a feral cat while at work.  She is here today for vaccination on day 3 but has developed worsening rash which she would also like evaluated.  No involvement of the oral mucosa.  No worsening rash with antibiotics.    Past Medical History:  Diagnosis Date   Animal bite of finger    a cat   Anxiety    Asthma    Bronchitis    Endometriosis    Insomnia    PCOS (polycystic ovarian syndrome)    Postgastrectomy malabsorption    PTSD (post-traumatic stress disorder)    Sleep apnea    Sleep disorder     Review of Systems  Constitutional: No fever/chills Cardiovascular: Denies chest pain. Respiratory: Denies shortness of breath. Gastrointestinal: No abdominal pain.  No nausea, no vomiting.  No diarrhea.  No constipation. Genitourinary: Negative for dysuria. Musculoskeletal: Negative for back pain. Skin: Positive for rash. Neurological: Negative for headaches, focal weakness or numbness.  ____________________________________________   PHYSICAL EXAM:  VITAL SIGNS: ED Triage Vitals [09/03/22 1753]  Enc Vitals Group     BP (!) 174/108     Pulse Rate 96     Resp 18     Temp 98.1 F (36.7 C)     Temp Source Oral     SpO2 100 %   Constitutional: Alert and oriented. Well appearing and in no acute distress. Eyes: Conjunctivae are normal.  Head: Atraumatic. Nose: No  congestion/rhinnorhea. Mouth/Throat: Mucous membranes are moist.  No oral lesions.  Neck: No stridor.  Cardiovascular: Normal rate, regular rhythm. Good peripheral circulation. Grossly normal heart sounds.   Respiratory: Normal respiratory effort.  No retractions. Lungs CTAB. Gastrointestinal: Soft and nontender. No distention.  Musculoskeletal: No lower extremity tenderness nor edema. No gross deformities of extremities. Neurologic:  Normal speech and language. No gross focal neurologic deficits are appreciated.  Skin:  Skin is warm, dry and intact.  No deep ecchymoses or bulla.  No mucosal involvement of lesions.  Patient with fine, erythematous, slightly raised rash throughout her torso and upper extremities.  No hives appreciated. No petechiae.    ____________________________________________   LABS (all labs ordered are listed, but only abnormal results are displayed)  Labs Reviewed  I-STAT BETA HCG BLOOD, ED ([facility], [facility], [facility] ONLY)   ____________________________________________   PROCEDURES  Procedure(s) performed:   Procedures  None  ____________________________________________   INITIAL IMPRESSION / ASSESSMENT AND PLAN / ED COURSE  Pertinent labs & imaging results that were available during my care of the patient were reviewed by me and considered in my medical decision making (see chart for details).   This patient is Presenting for Evaluation of rash, which does require a range of treatment options, and is a complaint that involves a moderate risk of morbidity and mortality.  The Differential Diagnoses include eczema, allergic dermatitis, DRESS, TEN/SJS, etc.  Critical  Interventions-    Medications  famotidine (PEPCID) tablet 20 mg (20 mg Oral Given 09/03/22 1809)  predniSONE (DELTASONE) tablet 60 mg (60 mg Oral Given 09/03/22 1809)  diphenhydrAMINE (BENADRYL) capsule 25 mg (25 mg Oral Given 09/03/22 1809)  rabies vaccine (RABAVERT) injection 1 mL (1 mL Intramuscular  Given 09/03/22 2155)  hydrOXYzine (ATARAX) tablet 25 mg (25 mg Oral Given 09/03/22 2157)    Reassessment after intervention: Itching remains.    I did obtain Additional Historical Information from husband at bedside.  I decided to review pertinent External Data, and in summary rabies series initiated 3 days prior.   Medical Decision Making: Summary:  Patient presents to the emergency department with itchy rash which is progressed over the past several weeks.  Rash is not consistent with SJS although this was considered a brief interruption in her Lamictal dose several weeks ago.  This does not appear to be a drug sensitivity rash or acute reaction to the rabies vaccine.  Plan to administer her next dose in the series here.  Plan for brief steroid burst along with continued topical medications.  Will provide Atarax as needed for itching as well.  Patient has a PCP follow-up appointment in the coming week.  Discussed tricked ED return precautions.  Patient's presentation is most consistent with acute illness / injury with system symptoms.   Disposition: discharge  ____________________________________________  FINAL CLINICAL IMPRESSION(S) / ED DIAGNOSES  Final diagnoses:  Rash     NEW OUTPATIENT MEDICATIONS STARTED DURING THIS VISIT:  Discharge Medication List as of 09/03/2022  9:23 PM     START taking these medications   Details  hydrOXYzine (ATARAX) 25 MG tablet Take 1 tablet (25 mg total) by mouth every 6 (six) hours as needed for itching., Starting Sun 09/03/2022, Normal    omeprazole (PRILOSEC) 20 MG capsule Take 1 capsule (20 mg total) by mouth daily., Starting Sun 09/03/2022, Until Tue 10/03/2022, Normal    predniSONE (DELTASONE) 20 MG tablet Take 1 tablet (20 mg total) by mouth daily for 4 days., Starting Mon 09/04/2022, Until Fri 09/08/2022, Normal        Note:  This document was prepared using Dragon voice recognition software and may include unintentional dictation  errors.  Nanda Quinton, MD, [facility] Emergency Medicine    Amberlee Garvey, Wonda Olds, MD 09/03/22 [phone]

## 2022-09-03 NOTE — ED Provider Triage Note (Signed)
Emergency Medicine Provider Triage Evaluation Note  SHANECE COCHRANE , a 32 y.o. female  was evaluated in triage.  Pt complains od diffuse puritic rash and need for rabies prophylaxis. She had last vaccine on Thursday. She went to [facility] and was told that they couldn't see her. On augmentin and azithromycin  Review of Systems  Positive: rash Negative: Fever, swelling  Physical Exam  BP (!) 174/108   Pulse 96   Temp 98.1 F (36.7 C) (Oral)   Resp 18   LMP 08/29/2022   SpO2 100%  Gen:   Awake, no distress   Resp:  Normal effort  MSK:   Moves extremities without difficulty  Other:  Diffuse eczematous rashand excoriation  Medical Decision Making  Medically screening exam initiated at 5:58 PM.  Appropriate orders placed.  DOVIE KAPUSTA was informed that the remainder of the evaluation will be completed by another provider, this initial triage assessment does not replace that evaluation, and the importance of remaining in the ED until their evaluation is complete.  Given prednisone, benadryl and pepcid ( s/p gastric bypass).    Margarita Mail, PA-C 09/03/22 6314

## 2022-09-03 NOTE — ED Triage Notes (Signed)
Pt here from home with c/o rash after rabies shots , no meds prior to arrival

## 2022-09-03 NOTE — Discharge Instructions (Addendum)
I am starting steroid medications for the next 4 days. You may continue topical medications for itching and Atarax as needed. Please keep your appointment with your PCP next week. Return with any new or suddenly worsening symptoms.

## 2022-09-07 ENCOUNTER — Ambulatory Visit (HOSPITAL_COMMUNITY)
Admission: RE | Admit: 2022-09-07 | Discharge: 2022-09-07 | Disposition: A | Discharge status: Home / Self Care | Payer: No Typology Code available for payment source | Source: Ambulatory Visit | Attending: Internal Medicine | Admitting: Internal Medicine

## 2022-09-07 DIAGNOSIS — Z203 Contact with and (suspected) exposure to rabies: Secondary | ICD-10-CM | POA: Diagnosis not present

## 2022-09-07 MED ORDER — RABIES VACCINE, PCEC IM SUSR
1.0000 mL | Freq: Once | INTRAMUSCULAR | Status: AC
  Administered 2022-09-07: 1 mL via INTRAMUSCULAR

## 2022-09-07 MED ORDER — RABIES VACCINE, PCEC IM SUSR
INTRAMUSCULAR | Status: AC
  Filled 2022-09-07: qty 1

## 2022-09-07 NOTE — ED Notes (Addendum)
Pt here today for her rabies vaccine injection 3. She states she has had an allergic reaction to the vaccine and been pre medicated from ER. She has already took her premeds today. She states she is ok to get vaccine. I confirmed she didn't feel she needed to wait or see a provider and she declined.   Vaccine gave and pt stayed 10 mins talking to staff with out any SOB or reaction.

## 2022-09-14 ENCOUNTER — Ambulatory Visit (HOSPITAL_COMMUNITY)
Admission: RE | Admit: 2022-09-14 | Discharge: 2022-09-14 | Disposition: A | Discharge status: Home / Self Care | Payer: Worker's Compensation | Source: Ambulatory Visit | Attending: Internal Medicine | Admitting: Internal Medicine

## 2022-09-14 DIAGNOSIS — Z203 Contact with and (suspected) exposure to rabies: Secondary | ICD-10-CM | POA: Diagnosis not present

## 2022-09-14 MED ORDER — RABIES VACCINE, PCEC IM SUSR
INTRAMUSCULAR | Status: AC
  Filled 2022-09-14: qty 1

## 2022-09-14 MED ORDER — RABIES VACCINE, PCEC IM SUSR
1.0000 mL | Freq: Once | INTRAMUSCULAR | Status: AC
  Administered 2022-09-14: 1 mL via INTRAMUSCULAR

## 2022-09-14 NOTE — ED Notes (Signed)
Rabies injection given in the left anterior thigh. Patient tolerated well.

## 2022-09-14 NOTE — ED Notes (Signed)
Patient presenting for rabies vaccine.

## 2022-10-09 ENCOUNTER — Emergency Department (HOSPITAL_COMMUNITY)
Admission: EM | Admit: 2022-10-09 | Discharge: 2022-10-10 | Disposition: A | Discharge status: Home / Self Care | Payer: BC Managed Care – PPO | Attending: Emergency Medicine | Admitting: Emergency Medicine

## 2022-10-09 ENCOUNTER — Other Ambulatory Visit: Payer: Self-pay

## 2022-10-09 DIAGNOSIS — G43409 Hemiplegic migraine, not intractable, without status migrainosus: Secondary | ICD-10-CM | POA: Diagnosis not present

## 2022-10-09 DIAGNOSIS — F419 Anxiety disorder, unspecified: Secondary | ICD-10-CM | POA: Diagnosis not present

## 2022-10-09 DIAGNOSIS — Z87891 Personal history of nicotine dependence: Secondary | ICD-10-CM | POA: Diagnosis not present

## 2022-10-09 DIAGNOSIS — J45909 Unspecified asthma, uncomplicated: Secondary | ICD-10-CM | POA: Insufficient documentation

## 2022-10-09 MED ORDER — SUMATRIPTAN SUCCINATE 6 MG/0.5ML ~~LOC~~ SOLN
6.0000 mg | Freq: Once | SUBCUTANEOUS | Status: AC
  Administered 2022-10-09: 6 mg via SUBCUTANEOUS
  Filled 2022-10-09: qty 0.5
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
## 2022-10-09 NOTE — ED Triage Notes (Signed)
Pt accompanied by Husband Herbie Baltimore. Pt c/o anxiety for the past week with decreased sleep, nightmares 3-4x per night, and decreased appetite. 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 Tearful in triage. Pt also c/o mirgrain that started yesterday.  HX PTSD and anxiety. Denies SI/HI.

## 2022-10-10 MED ORDER — LORAZEPAM 2 MG/ML IJ SOLN
2.0000 mg | Freq: Once | INTRAMUSCULAR | Status: AC
  Administered 2022-10-10: 2 mg via INTRAVENOUS
  Filled 2022-10-10: qty 1

## 2022-10-10 NOTE — ED Notes (Signed)
Pt calls out stating "her ativan is not making her sleepy and she would like another dose".

## 2023-03-12 ENCOUNTER — Emergency Department (HOSPITAL_COMMUNITY)
Admission: EM | Admit: 2023-03-12 | Discharge: 2023-03-12 | Disposition: A | Discharge status: Home / Self Care | Payer: BC Managed Care – PPO | Attending: Emergency Medicine | Admitting: Emergency Medicine

## 2023-03-12 DIAGNOSIS — R45851 Suicidal ideations: Secondary | ICD-10-CM

## 2023-03-12 DIAGNOSIS — F331 Major depressive disorder, recurrent, moderate: Secondary | ICD-10-CM

## 2023-03-12 DIAGNOSIS — F29 Unspecified psychosis not due to a substance or known physiological condition: Secondary | ICD-10-CM | POA: Diagnosis not present

## 2023-03-12 DIAGNOSIS — J45909 Unspecified asthma, uncomplicated: Secondary | ICD-10-CM | POA: Insufficient documentation

## 2023-03-12 DIAGNOSIS — F411 Generalized anxiety disorder: Secondary | ICD-10-CM

## 2023-03-12 LAB — CBC WITH DIFFERENTIAL/PLATELET
Abs Immature Granulocytes: 0.01 10*3/uL (ref 0.00–0.07)
Basophils Absolute: 0 10*3/uL (ref 0.0–0.1)
Basophils Relative: 1 %
Eosinophils Absolute: 0.1 10*3/uL (ref 0.0–0.5)
Eosinophils Relative: 2 %
HCT: 43.9 % (ref 36.0–46.0)
Hemoglobin: 14.7 g/dL (ref 12.0–15.0)
Immature Granulocytes: 0 %
Lymphocytes Relative: 50 %
Lymphs Abs: 2.9 10*3/uL (ref 0.7–4.0)
MCH: 30.3 pg (ref 26.0–34.0)
MCHC: 33.5 g/dL (ref 30.0–36.0)
MCV: 90.5 fL (ref 80.0–100.0)
Monocytes Absolute: 0.5 10*3/uL (ref 0.1–1.0)
Monocytes Relative: 8 %
Neutro Abs: 2.3 10*3/uL (ref 1.7–7.7)
Neutrophils Relative %: 39 %
Platelets: 153 10*3/uL (ref 150–400)
RBC: 4.85 MIL/uL (ref 3.87–5.11)
RDW: 11.7 % (ref 11.5–15.5)
WBC: 5.9 10*3/uL (ref 4.0–10.5)
nRBC: 0 % (ref 0.0–0.2)

## 2023-03-12 LAB — RAPID URINE DRUG SCREEN, HOSP PERFORMED
Amphetamines: NOT DETECTED
Barbiturates: NOT DETECTED
Benzodiazepines: NOT DETECTED
Cocaine: POSITIVE — AB
Opiates: NOT DETECTED
Tetrahydrocannabinol: NOT DETECTED

## 2023-03-12 LAB — ETHANOL: Alcohol, Ethyl (B): 125 mg/dL — ABNORMAL HIGH (ref <10)

## 2023-03-12 LAB — COMPREHENSIVE METABOLIC PANEL
ALT: 15 U/L (ref 0–44)
AST: 14 U/L — ABNORMAL LOW (ref 15–41)
Albumin: 3.4 g/dL — ABNORMAL LOW (ref 3.5–5.0)
Alkaline Phosphatase: 65 U/L (ref 38–126)
Anion gap: 7 (ref 5–15)
BUN: 16 mg/dL (ref 6–20)
CO2: 22 mmol/L (ref 22–32)
Calcium: 8.5 mg/dL — ABNORMAL LOW (ref 8.9–10.3)
Chloride: 110 mmol/L (ref 98–111)
Creatinine, Ser: 0.6 mg/dL (ref 0.44–1.00)
GFR, Estimated: >60 mL/min (ref >60)
Glucose, Bld: 86 mg/dL (ref 70–99)
Potassium: 3.1 mmol/L — ABNORMAL LOW (ref 3.5–5.1)
Sodium: 139 mmol/L (ref 135–145)
Total Bilirubin: 0.4 mg/dL (ref 0.3–1.2)
Total Protein: 6.8 g/dL (ref 6.5–8.1)

## 2023-03-12 LAB — HCG, SERUM, QUALITATIVE: Preg, Serum: NEGATIVE

## 2023-03-12 LAB — SALICYLATE LEVEL: Salicylate Lvl: <7 mg/dL — ABNORMAL LOW (ref 7.0–30.0)

## 2023-03-12 LAB — ACETAMINOPHEN LEVEL: Acetaminophen (Tylenol), Serum: <10 ug/mL — ABNORMAL LOW (ref 10–30)

## 2023-03-12 MED ORDER — LAMOTRIGINE 25 MG PO TABS: 50.0000 mg | ORAL_TABLET | Freq: Every day | ORAL | Status: DC

## 2023-03-12 MED ORDER — POTASSIUM CHLORIDE CRYS ER 20 MEQ PO TBCR
40.0000 meq | EXTENDED_RELEASE_TABLET | Freq: Once | ORAL | Status: AC
  Administered 2023-03-12: 40 meq via ORAL
  Filled 2023-03-12: qty 2

## 2023-03-12 MED ORDER — LAMOTRIGINE 100 MG PO TABS: 100.0000 mg | ORAL_TABLET | Freq: Every day | ORAL | Status: DC

## 2023-03-12 NOTE — ED Notes (Signed)
Pt. Has been wanded by security. 

## 2023-03-12 NOTE — ED Notes (Signed)
Pt. Dressed out in scrubs and friend took all personal belongings.

## 2023-03-12 NOTE — ED Provider Notes (Signed)
Accepted handoff at shift change from Army Melia PA-C. Please see prior provider note for more detail.   Briefly: Patient is 32 y.o.   DDX: concern for suicidal ideation, possibly situational, reports that she has not filled her home medications for Lamictal and Zoloft although she reports that she is taking them.  Consults: I spoke with Dr. Elesa Hacker with psychiatry who does not anticipate this patient will require an inpatient admission for her suicidal ideation and is comfortable with discharge as she has close psychiatric follow-up.  Think this is reasonable, I confirmed with the patient about the status of her prescriptions and we opted for discharge at this time  Plan: Patient reports she has enough Lamictal and Zoloft, and on speaking with her she shows reasonable insight, I agree with psychiatric recommendations and think the patient is stable for discharge at this time     [place] 03/12/23 0735    Gloris Manchester, MD 03/12/23 [phone]

## 2023-03-12 NOTE — ED Triage Notes (Signed)
Pt's friend reports that pt has had suicidal ideations over last few months and tonight pt called her from her car in a parking lot of a bar and stated that she needed help and that her life insurance is worth more than her life. Pt states taht she doesn't actually want to die, she just wants to sleep for areally long time.
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
## 2023-03-12 NOTE — BH Assessment (Signed)
TTS consult will be completed by Dr. Elesa Hacker at 6:45am. [facility] is Maralyn Sago, [phone]. Secure chat initiated.

## 2023-03-12 NOTE — ED Notes (Signed)
Pt called stating that she left her stuff, belongings were found & is waiting in triage for her to pickup.

## 2023-03-13 LAB — LAMOTRIGINE LEVEL: Lamotrigine Lvl: <1 ug/mL — ABNORMAL LOW (ref 2.0–20.0)

## 2023-07-16 ENCOUNTER — Encounter (HOSPITAL_COMMUNITY): Payer: Self-pay

## 2023-07-16 ENCOUNTER — Other Ambulatory Visit: Payer: Self-pay

## 2023-07-16 ENCOUNTER — Emergency Department (HOSPITAL_COMMUNITY): Payer: Self-pay

## 2023-07-16 ENCOUNTER — Emergency Department (HOSPITAL_COMMUNITY)
Admission: EM | Admit: 2023-07-16 | Discharge: 2023-07-16 | Disposition: A | Discharge status: Home / Self Care | Payer: Self-pay | Attending: Emergency Medicine | Admitting: Emergency Medicine

## 2023-07-16 DIAGNOSIS — R1084 Generalized abdominal pain: Secondary | ICD-10-CM | POA: Insufficient documentation

## 2023-07-16 DIAGNOSIS — R42 Dizziness and giddiness: Secondary | ICD-10-CM | POA: Insufficient documentation

## 2023-07-16 DIAGNOSIS — H538 Other visual disturbances: Secondary | ICD-10-CM | POA: Insufficient documentation

## 2023-07-16 DIAGNOSIS — E876 Hypokalemia: Secondary | ICD-10-CM | POA: Insufficient documentation

## 2023-07-16 LAB — CBC
HCT: 45.5 % (ref 36.0–46.0)
Hemoglobin: 15.2 g/dL — ABNORMAL HIGH (ref 12.0–15.0)
MCH: 30.8 pg (ref 26.0–34.0)
MCHC: 33.4 g/dL (ref 30.0–36.0)
MCV: 92.1 fL (ref 80.0–100.0)
Platelets: 221 10*3/uL (ref 150–400)
RBC: 4.94 MIL/uL (ref 3.87–5.11)
RDW: 12 % (ref 11.5–15.5)
WBC: 10.1 10*3/uL (ref 4.0–10.5)
nRBC: 0 % (ref 0.0–0.2)

## 2023-07-16 LAB — BASIC METABOLIC PANEL
Anion gap: 3 — ABNORMAL LOW (ref 5–15)
BUN: 25 mg/dL — ABNORMAL HIGH (ref 6–20)
CO2: 26 mmol/L (ref 22–32)
Calcium: 8.7 mg/dL — ABNORMAL LOW (ref 8.9–10.3)
Chloride: 107 mmol/L (ref 98–111)
Creatinine, Ser: 0.88 mg/dL (ref 0.44–1.00)
GFR, Estimated: >60 mL/min (ref >60)
Glucose, Bld: 81 mg/dL (ref 70–99)
Potassium: 3.2 mmol/L — ABNORMAL LOW (ref 3.5–5.1)
Sodium: 136 mmol/L (ref 135–145)

## 2023-07-16 LAB — RAPID URINE DRUG SCREEN, HOSP PERFORMED
Amphetamines: POSITIVE — AB
Barbiturates: NOT DETECTED
Benzodiazepines: NOT DETECTED
Cocaine: NOT DETECTED
Opiates: NOT DETECTED
Tetrahydrocannabinol: POSITIVE — AB

## 2023-07-16 LAB — URINALYSIS, ROUTINE W REFLEX MICROSCOPIC
Bilirubin Urine: NEGATIVE
Glucose, UA: NEGATIVE mg/dL
Hgb urine dipstick: NEGATIVE
Ketones, ur: NEGATIVE mg/dL
Leukocytes,Ua: NEGATIVE
Nitrite: NEGATIVE
Protein, ur: NEGATIVE mg/dL
Specific Gravity, Urine: 1.027 (ref 1.005–1.030)
pH: 5 (ref 5.0–8.0)

## 2023-07-16 LAB — TSH: TSH: 2.492 u[IU]/mL (ref 0.350–4.500)

## 2023-07-16 LAB — HCG, SERUM, QUALITATIVE: Preg, Serum: NEGATIVE

## 2023-07-16 MED ORDER — LACTATED RINGERS IV BOLUS
1000.0000 mL | Freq: Once | INTRAVENOUS | Status: AC
  Administered 2023-07-16: 1000 mL via INTRAVENOUS

## 2023-07-16 MED ORDER — IOHEXOL 300 MG/ML  SOLN
100.0000 mL | Freq: Once | INTRAMUSCULAR | Status: AC | PRN
  Administered 2023-07-16: 100 mL via INTRAVENOUS

## 2023-07-16 MED ORDER — ONDANSETRON HCL 4 MG/2ML IJ SOLN
4.0000 mg | Freq: Once | INTRAMUSCULAR | Status: AC
  Administered 2023-07-16: 4 mg via INTRAVENOUS
  Filled 2023-07-16: qty 2

## 2023-07-16 MED ORDER — FENTANYL CITRATE PF 50 MCG/ML IJ SOSY
50.0000 ug | PREFILLED_SYRINGE | Freq: Once | INTRAMUSCULAR | Status: AC
  Administered 2023-07-16: 50 ug via INTRAVENOUS
  Filled 2023-07-16: qty 1

## 2023-07-16 MED ORDER — ACETAMINOPHEN 500 MG PO TABS
1000.0000 mg | ORAL_TABLET | Freq: Once | ORAL | Status: DC
Start: 2023-07-16 — End: 2023-07-16

## 2023-07-16 NOTE — ED Provider Notes (Signed)
[place] [facility] AT [facility] Provider Note   CSN: [id number] Arrival date & time: 07/16/23  1315     History  Chief Complaint  Patient presents with   Fatigue    [name] is a 32 y.o. female.  Approximately noon today, week and dizzy with some blurry vision.  She sat down and then became diaphoretic.  She states that this kind of episode has happened in the past.  She has that during these past episodes generally she is given fluids and her symptoms resolved.  She has a history of partial and grand mal seizures in addition to gastric bypass in which she forgets to eat.  She notes the last time she ate was Saturday and she has not drinking much except a small water and a couple cups of coffee.  She denies any recent seizure activity and she has been continuing to take her medications.  She has not had any loss of consciousness. Denies fever, chest pain/pressure, she, nausea/vomiting, diarrhea.  She is not on any blood thinners.  Denies any drug use or change in smoking habits, she does vape.      Home Medications Prior to Admission medications   Medication Sig Start Date End Date Taking? Authorizing Provider  amphetamine-dextroamphetamine (ADDERALL XR) 25 MG 24 hr capsule Take 25 mg by mouth every morning.    [provider]  amphetamine-dextroamphetamine (ADDERALL) 20 MG tablet Take 20 mg by mouth daily. 10/04/22   [provider]  baclofen (LIORESAL) 20 MG tablet Take 1 tablet by mouth at bedtime. 06/27/19   [provider]  Calcium Citrate-Vitamin D 250-200 MG-UNIT TABS Take 1 tablet by mouth daily.    [provider]  Cholecalciferol 125 MCG (5000 UT) capsule Take 2 tablets by mouth daily. 04/28/19   [provider]  clonazePAM (KLONOPIN) 1 MG tablet Take 0.5-1 mg by mouth as directed. 12/12/21   [provider]  EPINEPHrine 0.3 mg/0.3 mL IJ SOAJ injection Inject 0.3 mg into the muscle as needed  for anaphylaxis. 06/20/21   [provider]  etonogestrel-ethinyl estradiol (NUVARING) 0.12-0.015 MG/24HR vaginal ring Place 1 each vaginally every 28 (twenty-eight) days. Insert vaginally and leave in place for 3 consecutive weeks, then remove for 1 week.    [provider]  gabapentin (NEURONTIN) 300 MG capsule Take 1 capsule (300 mg total) by mouth 3 (three) times daily for 5 days. 10/15/20 10/20/20  Tonia Brooms, MD  gabapentin (NEURONTIN) 400 MG capsule Take 400 mg by mouth 3 (three) times daily.    [provider]  hydrOXYzine (ATARAX) 25 MG tablet Take by mouth. 02/19/23   [provider]  lamoTRIgine (LAMICTAL) 100 MG tablet Take 100 mg by mouth daily.    [provider]  lamoTRIgine (LAMICTAL) 25 MG tablet Take 50 mg by mouth at bedtime. 11/30/21   [provider]  Multiple Vitamin (MULTIVITAMIN WITH MINERALS) TABS tablet Take 1 tablet by mouth daily.    [provider]  omeprazole (PRILOSEC) 20 MG capsule Take 1 capsule (20 mg total) by mouth daily. 09/03/22 10/03/22  Long, Arlyss Repress, MD  oxyCODONE-acetaminophen (PERCOCET) 10-325 MG tablet Take 1 tablet by mouth 2 (two) times daily.    [provider]  sertraline (ZOLOFT) 100 MG tablet Take 200 mg by mouth at bedtime.    [provider]  topiramate (TOPAMAX) 100 MG tablet Take 100 mg by mouth at bedtime. 08/22/22   [provider]  albuterol (  VENTOLIN HFA) 108 (90 Base) MCG/ACT inhaler Inhale 2 puffs into the lungs every 4 (four) hours as needed for wheezing or shortness of breath. Patient not taking: Reported on 11/02/2020 04/28/20 11/09/20  Horton, Mayer Masker, MD  levalbuterol [facility] HFA) 45 MCG/ACT inhaler Inhale 1-2 puffs into the lungs every 6 (six) hours as needed for wheezing or shortness of breath. Patient not taking: Reported on 11/02/2020  11/09/20  [provider]  potassium chloride (K-DUR) 10 MEQ tablet Take 1 tablet (10 mEq total) by mouth  daily. Patient not taking: Reported on 08/16/2015 04/27/15 08/16/15  Alison Murray, MD  potassium chloride SA (KLOR-CON) 20 MEQ tablet Take 1 tablet (20 mEq total) by mouth 2 (two) times daily. Patient not taking: Reported on 11/02/2020 04/28/20 11/09/20  Shon Baton, MD      Allergies    Bee venom, Iodine, Tetanus-diphth-acell pertussis, Aripiprazole, Prazosin, Vaccinium angustifolium, and Morphine and codeine    Review of Systems   Review of Systems  Neurological:  Positive for weakness.    Physical Exam Updated Vital Signs BP (!) 143/101   Pulse 63   Temp 98 F (36.7 C)   Resp 16   Ht 5\' 9"  (1.753 m)   Wt 77.1 kg   SpO2 100%   BMI 25.10 kg/m  Physical Exam Constitutional:      General: She is not in acute distress.    Appearance: Normal appearance. She is not toxic-appearing.  HENT:     Mouth/Throat:     Mouth: Mucous membranes are moist.  Eyes:     Comments: Significantly dilated bilaterally  Cardiovascular:     Rate and Rhythm: Normal rate and regular rhythm.     Heart sounds: Normal heart sounds.  Pulmonary:     Breath sounds: Normal breath sounds.  Neurological:     General: No focal deficit present.     Mental Status: She is alert and oriented to person, place, and time.     Cranial Nerves: No cranial nerve deficit.     Motor: Weakness (right upper and lower extremity) present.     Coordination: Coordination normal.    ED Results / Procedures / Treatments   Labs (all labs ordered are listed, but only abnormal results are displayed) Labs Reviewed  BASIC METABOLIC PANEL - Abnormal; Notable for the following components:      Result Value   Potassium 3.2 (*)    BUN 25 (*)    Calcium 8.7 (*)    Anion gap 3 (*)    All other components within normal limits  CBC - Abnormal; Notable for the following components:   Hemoglobin 15.2 (*)    All other components within normal limits  RAPID URINE DRUG SCREEN, HOSP PERFORMED - Abnormal; Notable for the  following components:   Amphetamines POSITIVE (*)    Tetrahydrocannabinol POSITIVE (*)    All other components within normal limits  URINALYSIS, ROUTINE W REFLEX MICROSCOPIC  HCG, SERUM, QUALITATIVE  TSH  CBG MONITORING, ED   EKG EKG Interpretation Date/Time:  Monday July 16 2023 15:32:27 EST Ventricular Rate:  64 PR Interval:  122 QRS Duration:  101 QT Interval:  413 QTC Calculation: 427 R Axis:   66  Text Interpretation: Sinus rhythm No significant change since last tracing Confirmed by Gwyneth Sprout ([id number]) on 07/16/2023 3:54:32 PM  Radiology CT ABDOMEN PELVIS W CONTRAST  Result Date: 07/16/2023 CLINICAL DATA:  Abdominal pain EXAM: CT ABDOMEN AND PELVIS WITH CONTRAST TECHNIQUE: Multidetector CT imaging  of the abdomen and pelvis was performed using the standard protocol following bolus administration of intravenous contrast. RADIATION DOSE REDUCTION: This exam was performed according to the departmental dose-optimization program which includes automated exposure control, adjustment of the mA and/or kV according to patient size and/or use of iterative reconstruction technique. CONTRAST:  OMNIPAQUE IOHEXOL 300 MG/ML  SOLN COMPARISON:  10/17/2020 FINDINGS: Lower chest: No acute abnormality.  Small hiatal hernia. Hepatobiliary: No focal hepatic abnormality. Gallbladder unremarkable. Pancreas: No focal abnormality or ductal dilatation. Spleen: No focal abnormality.  Normal size. Adrenals/Urinary Tract: No adrenal abnormality. No focal renal abnormality. No stones or hydronephrosis. Urinary bladder is unremarkable. Stomach/Bowel: Normal appendix. Status post gastric bypass. No bowel obstruction. Vascular/Lymphatic: No evidence of aneurysm or adenopathy. Reproductive: Uterus and adnexa unremarkable.  No mass. Other: No free fluid or free air. Musculoskeletal: No acute bony abnormality. IMPRESSION: No acute findings in the abdomen or pelvis. Small hiatal hernia. Prior gastric bypass  without complicating feature. Electronically Signed   By: Charlett Nose M.D.   On: 07/16/2023 19:18   CT Head Wo Contrast  Result Date: 07/16/2023 CLINICAL DATA:  Provided history: Neuro deficit, acute, stroke suspected. Additional history provided: Fatigue, dizziness, blurry/double vision. EXAM: CT HEAD WITHOUT CONTRAST TECHNIQUE: Contiguous axial images were obtained from the base of the skull through the vertex without intravenous contrast. RADIATION DOSE REDUCTION: This exam was performed according to the departmental dose-optimization program which includes automated exposure control, adjustment of the mA and/or kV according to patient size and/or use of iterative reconstruction technique. COMPARISON:  Head CT 10/17/2020. FINDINGS: Brain: Cerebral volume is normal. There is no acute intracranial hemorrhage. No demarcated cortical infarct. No extra-axial fluid collection. No evidence of an intracranial mass. No midline shift. Vascular: No hyperdense vessel. Skull: No calvarial fracture or aggressive osseous lesion. Sinuses/Orbits: No orbital mass or acute orbital finding. Mucosal thickening versus mucous retention cyst within the left maxillary sinus, incompletely imaged (series 2, image 2). IMPRESSION: 1.  No evidence of an acute intracranial abnormality. 2. Left maxillary sinus disease as described. Electronically Signed   By: Jackey Loge D.O.   On: 07/16/2023 16:43    Procedures Procedures    Medications Ordered in ED Medications  lactated ringers bolus 1,000 mL (0 mLs Intravenous Stopped 07/16/23 1802)  fentaNYL (SUBLIMAZE) injection 50 mcg (50 mcg Intravenous Given 07/16/23 1758)  lactated ringers bolus 1,000 mL (0 mLs Intravenous Stopped 07/16/23 2019)  ondansetron (ZOFRAN) injection 4 mg (4 mg Intravenous Given 07/16/23 1754)  iohexol (OMNIPAQUE) 300 MG/ML solution 100 mL (100 mLs Intravenous Contrast Given 07/16/23 1826)  fentaNYL (SUBLIMAZE) injection 50 mcg (50 mcg Intravenous Given  07/16/23 1924)    ED Course/ Medical Decision Making/ A&P Clinical Course as of 07/16/23 2057  Mon Jul 16, 2023  1749 Patient began to have generalized abdominal pain and nausea despite not having this prior. [AD]    Clinical Course User Index [AD] Shelby Mattocks, DO                                 Medical Decision Making 32 year old female presenting with recent onset weakness and blurry vision with past medical history of gastric bypass and seizure disorder.  Differential diagnosis includes dehydration/malnutrition, electrolyte abnormality, seizure, arrhythmia, CVA, anemia, hypoglycemia.  Patient BMP normal with the exception of slightly low potassium although this is improved compared to significantly low potassium she has had in the past.  Kidney function  normal.  Hemoglobin slightly increased potentially related to hemodynamic concentration given recent decreased fluid intake.  TSH normal.  EKG NSR.  Clinically her symptoms are not consistent with seizure.  On physical exam she has had right sided weakness therefore obtained head CT which did not show acute neurological changes.  Provided 1 L LR bolus for dehydration.  Right-sided weakness improved.  After receiving LR bolus, she had acute episode of generalized abdominal pain.  Given her acute onset abdominal pain, and her history of gastric bypass, obtained CT abdomen/pelvis with contrast.  She does have iodine allergy but has received contrast in the past.  Fentanyl for pain, Zofran for nausea.  Provided another 1 L LR bolus.  CT abdomen/pelvis does not reveal any acute findings.  On reevaluation, she states her dizziness has improved and she is amenable to returning home.  She has Zofran at home and did not need another prescription for this.  Recommended following up with PCP as needed.  Amount and/or Complexity of Data Reviewed Labs: ordered. Radiology: ordered.  Risk OTC drugs. Prescription drug management.         Final  Clinical Impression(s) / ED Diagnoses Final diagnoses:  Dizziness  Generalized abdominal pain   Rx / DC Orders ED Discharge Orders     None        Shelby Mattocks, DO 07/16/23 2057    Gwyneth Sprout, MD 07/16/23 2348

## 2023-07-16 NOTE — ED Notes (Signed)
Patient has abdominal pain that just started, she said " it feels like someone is ripping her gut out." She rates her pain 10/10.  Dr Anitra Lauth made aware.

## 2023-07-16 NOTE — ED Notes (Signed)
Called LAB to add urine drug screen .

## 2023-07-16 NOTE — Discharge Instructions (Signed)
He received blood work and imaging regarding your presenting symptoms of weakness and then acute onset abdominal pain.  This was unremarkable with the exception of a lower potassium.  I suspect your symptoms are related to not drinking/eating regularly.  Should your symptoms return, please seek further evaluation.  In the meantime, I recommend tracking your intake with a diary to ensure you are not forgetting to regularly eat/drink.

## 2023-07-16 NOTE — ED Notes (Signed)
Pt refused to have CBG checked

## 2023-07-16 NOTE — ED Triage Notes (Signed)
C/o fatigue, dizziness, blurry vision, and double vision x2 days.  Pt reports starting prednisone taper 5 days ago.  Refuses cbg in triage.  A&O x4

## 2023-07-16 NOTE — ED Notes (Signed)
Pt refused getting CBG stated "she will rather have blood drawn instead of getting finger stuck".

## 2023-08-18 ENCOUNTER — Other Ambulatory Visit: Payer: Self-pay

## 2023-08-18 ENCOUNTER — Observation Stay (HOSPITAL_COMMUNITY)
Admission: EM | Admit: 2023-08-18 | Discharge: 2023-08-19 | Discharge status: Against Medical Advice | Payer: Self-pay | Attending: Emergency Medicine | Admitting: Emergency Medicine

## 2023-08-18 ENCOUNTER — Emergency Department (HOSPITAL_COMMUNITY): Payer: Self-pay

## 2023-08-18 ENCOUNTER — Encounter (HOSPITAL_COMMUNITY): Payer: Self-pay

## 2023-08-18 DIAGNOSIS — Z79899 Other long term (current) drug therapy: Secondary | ICD-10-CM | POA: Insufficient documentation

## 2023-08-18 DIAGNOSIS — Z87891 Personal history of nicotine dependence: Secondary | ICD-10-CM | POA: Insufficient documentation

## 2023-08-18 DIAGNOSIS — Z1152 Encounter for screening for COVID-19: Secondary | ICD-10-CM | POA: Insufficient documentation

## 2023-08-18 DIAGNOSIS — R1032 Left lower quadrant pain: Secondary | ICD-10-CM | POA: Insufficient documentation

## 2023-08-18 DIAGNOSIS — R569 Unspecified convulsions: Principal | ICD-10-CM

## 2023-08-18 DIAGNOSIS — F411 Generalized anxiety disorder: Secondary | ICD-10-CM | POA: Diagnosis present

## 2023-08-18 DIAGNOSIS — E162 Hypoglycemia, unspecified: Secondary | ICD-10-CM

## 2023-08-18 DIAGNOSIS — F331 Major depressive disorder, recurrent, moderate: Secondary | ICD-10-CM | POA: Diagnosis present

## 2023-08-18 DIAGNOSIS — R109 Unspecified abdominal pain: Secondary | ICD-10-CM

## 2023-08-18 LAB — RESP PANEL BY RT-PCR (RSV, FLU A&B, COVID)  RVPGX2
Influenza A by PCR: NEGATIVE
Influenza B by PCR: NEGATIVE
Resp Syncytial Virus by PCR: NEGATIVE
SARS Coronavirus 2 by RT PCR: NEGATIVE

## 2023-08-18 LAB — CBC WITH DIFFERENTIAL/PLATELET
Abs Immature Granulocytes: 0.01 10*3/uL (ref 0.00–0.07)
Basophils Absolute: 0 10*3/uL (ref 0.0–0.1)
Basophils Relative: 1 %
Eosinophils Absolute: 0.1 10*3/uL (ref 0.0–0.5)
Eosinophils Relative: 2 %
HCT: 39.9 % (ref 36.0–46.0)
Hemoglobin: 13.5 g/dL (ref 12.0–15.0)
Immature Granulocytes: 0 %
Lymphocytes Relative: 35 %
Lymphs Abs: 2 10*3/uL (ref 0.7–4.0)
MCH: 31.2 pg (ref 26.0–34.0)
MCHC: 33.8 g/dL (ref 30.0–36.0)
MCV: 92.1 fL (ref 80.0–100.0)
Monocytes Absolute: 0.5 10*3/uL (ref 0.1–1.0)
Monocytes Relative: 9 %
Neutro Abs: 3.1 10*3/uL (ref 1.7–7.7)
Neutrophils Relative %: 53 %
Platelets: 180 10*3/uL (ref 150–400)
RBC: 4.33 MIL/uL (ref 3.87–5.11)
RDW: 12.2 % (ref 11.5–15.5)
WBC: 5.7 10*3/uL (ref 4.0–10.5)
nRBC: 0 % (ref 0.0–0.2)

## 2023-08-18 LAB — COMPREHENSIVE METABOLIC PANEL
ALT: 11 U/L (ref 0–44)
AST: 14 U/L — ABNORMAL LOW (ref 15–41)
Albumin: 3.4 g/dL — ABNORMAL LOW (ref 3.5–5.0)
Alkaline Phosphatase: 70 U/L (ref 38–126)
Anion gap: 10 (ref 5–15)
BUN: 23 mg/dL — ABNORMAL HIGH (ref 6–20)
CO2: 21 mmol/L — ABNORMAL LOW (ref 22–32)
Calcium: 8.4 mg/dL — ABNORMAL LOW (ref 8.9–10.3)
Chloride: 109 mmol/L (ref 98–111)
Creatinine, Ser: 0.56 mg/dL (ref 0.44–1.00)
GFR, Estimated: >60 mL/min (ref >60)
Glucose, Bld: 47 mg/dL — ABNORMAL LOW (ref 70–99)
Potassium: 3.6 mmol/L (ref 3.5–5.1)
Sodium: 140 mmol/L (ref 135–145)
Total Bilirubin: 0.4 mg/dL (ref <1.2)
Total Protein: 6.5 g/dL (ref 6.5–8.1)

## 2023-08-18 LAB — CBG MONITORING, ED
Glucose-Capillary: 51 mg/dL — ABNORMAL LOW (ref 70–99)
Glucose-Capillary: 65 mg/dL — ABNORMAL LOW (ref 70–99)
Glucose-Capillary: 56 mg/dL — ABNORMAL LOW (ref 70–99)
Glucose-Capillary: 77 mg/dL (ref 70–99)

## 2023-08-18 LAB — HCG, SERUM, QUALITATIVE: Preg, Serum: NEGATIVE

## 2023-08-18 LAB — ETHANOL: Alcohol, Ethyl (B): <10 mg/dL (ref <10)

## 2023-08-18 MED ORDER — LORAZEPAM 2 MG/ML IJ SOLN: 2.0000 mg | Freq: Once | INTRAMUSCULAR | Status: AC

## 2023-08-18 MED ORDER — ACETAMINOPHEN 500 MG PO TABS
1000.0000 mg | ORAL_TABLET | Freq: Once | ORAL | Status: AC
  Administered 2023-08-18: 1000 mg via ORAL
  Filled 2023-08-18: qty 2

## 2023-08-18 MED ORDER — MORPHINE SULFATE (PF) 4 MG/ML IV SOLN
4.0000 mg | Freq: Once | INTRAVENOUS | Status: AC
  Administered 2023-08-18: 4 mg via INTRAVENOUS
  Filled 2023-08-18: qty 1

## 2023-08-18 MED ORDER — LEVETIRACETAM 500 MG/5ML IV SOLN
4500.0000 mg | INTRAVENOUS | Status: AC
  Administered 2023-08-18: 4500 mg via INTRAVENOUS
  Filled 2023-08-18: qty 45

## 2023-08-18 MED ORDER — IOHEXOL 300 MG/ML  SOLN
100.0000 mL | Freq: Once | INTRAMUSCULAR | Status: AC | PRN
Start: 2023-08-18 — End: 2023-08-18
  Administered 2023-08-18: 100 mL via INTRAVENOUS

## 2023-08-18 MED ORDER — LORAZEPAM 2 MG/ML IJ SOLN
4.0000 mg | INTRAMUSCULAR | Status: DC | PRN
Start: 2023-08-18 — End: 2023-08-19
  Administered 2023-08-18: 4 mg via INTRAVENOUS
  Filled 2023-08-18 (×3): qty 2

## 2023-08-18 MED ORDER — DEXTROSE 50 % IV SOLN
1.0000 | Freq: Once | INTRAVENOUS | Status: AC
  Administered 2023-08-18: 50 mL via INTRAVENOUS
  Filled 2023-08-18: qty 50

## 2023-08-18 MED ORDER — ONDANSETRON HCL 4 MG/2ML IJ SOLN
4.0000 mg | Freq: Once | INTRAMUSCULAR | Status: AC
  Administered 2023-08-18: 4 mg via INTRAVENOUS
  Filled 2023-08-18: qty 2

## 2023-08-18 MED ORDER — IOHEXOL 9 MG/ML PO SOLN
500.0000 mL | ORAL | Status: AC
  Administered 2023-08-18 (×2): 500 mL via ORAL

## 2023-08-18 MED ORDER — SODIUM CHLORIDE 0.9 % IV SOLN: 60.0000 mg/kg | Freq: Once | INTRAVENOUS | Status: DC

## 2023-08-18 MED ORDER — LORAZEPAM 2 MG/ML IJ SOLN
INTRAMUSCULAR | Status: AC
  Administered 2023-08-18: 2 mg via INTRAVENOUS
  Filled 2023-08-18: qty 1

## 2023-08-18 MED ORDER — IOHEXOL 300 MG/ML  SOLN: 100.0000 mL | Freq: Once | INTRAMUSCULAR | Status: DC | PRN
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
## 2023-08-18 NOTE — ED Notes (Signed)
Patient transported to CT 

## 2023-08-18 NOTE — ED Notes (Signed)
Pt given OJ

## 2023-08-18 NOTE — ED Notes (Addendum)
Patient transported to CT 

## 2023-08-18 NOTE — ED Notes (Signed)
Pt having witnessed seizure, O2 sat was maintained, given 4mg  of Ativan.

## 2023-08-18 NOTE — ED Triage Notes (Signed)
Pt BIBA from work, c/o seizures.  Pt reports she has been having a few seizures since TBI, usually is triggered by stress.  Pt reports she is having head and abd pain.  3 seizures prior to EMS arrival, first caused her a head strike. 1 seizure in EMS bay, and 1 in room before transferring pt. Pt was given 2.5mg  IV midazolam. 18g in right AC  BP 156/98 CBG 83

## 2023-08-18 NOTE — ED Notes (Signed)
PO contrast finished.

## 2023-08-19 ENCOUNTER — Other Ambulatory Visit: Payer: Self-pay

## 2023-08-19 ENCOUNTER — Emergency Department (HOSPITAL_COMMUNITY): Payer: Self-pay

## 2023-08-19 ENCOUNTER — Encounter (HOSPITAL_COMMUNITY): Payer: Self-pay

## 2023-08-19 ENCOUNTER — Inpatient Hospital Stay (HOSPITAL_COMMUNITY): Payer: Self-pay

## 2023-08-19 ENCOUNTER — Inpatient Hospital Stay (HOSPITAL_COMMUNITY)
Admission: EM | Admit: 2023-08-19 | Discharge: 2023-08-20 | DRG: 880 | Discharge status: Against Medical Advice | Payer: Self-pay | Attending: Family Medicine | Admitting: Family Medicine

## 2023-08-19 DIAGNOSIS — Z5329 Procedure and treatment not carried out because of patient's decision for other reasons: Secondary | ICD-10-CM | POA: Diagnosis not present

## 2023-08-19 DIAGNOSIS — F603 Borderline personality disorder: Secondary | ICD-10-CM | POA: Diagnosis present

## 2023-08-19 DIAGNOSIS — Z887 Allergy status to serum and vaccine status: Secondary | ICD-10-CM

## 2023-08-19 DIAGNOSIS — G4730 Sleep apnea, unspecified: Secondary | ICD-10-CM | POA: Diagnosis present

## 2023-08-19 DIAGNOSIS — Z87820 Personal history of traumatic brain injury: Secondary | ICD-10-CM

## 2023-08-19 DIAGNOSIS — R1030 Lower abdominal pain, unspecified: Secondary | ICD-10-CM

## 2023-08-19 DIAGNOSIS — Z9884 Bariatric surgery status: Secondary | ICD-10-CM

## 2023-08-19 DIAGNOSIS — Z888 Allergy status to other drugs, medicaments and biological substances status: Secondary | ICD-10-CM

## 2023-08-19 DIAGNOSIS — Z885 Allergy status to narcotic agent status: Secondary | ICD-10-CM

## 2023-08-19 DIAGNOSIS — Z87892 Personal history of anaphylaxis: Secondary | ICD-10-CM

## 2023-08-19 DIAGNOSIS — Z91030 Bee allergy status: Secondary | ICD-10-CM

## 2023-08-19 DIAGNOSIS — F319 Bipolar disorder, unspecified: Secondary | ICD-10-CM | POA: Diagnosis present

## 2023-08-19 DIAGNOSIS — Z79899 Other long term (current) drug therapy: Secondary | ICD-10-CM

## 2023-08-19 DIAGNOSIS — R569 Unspecified convulsions: Principal | ICD-10-CM

## 2023-08-19 DIAGNOSIS — F410 Panic disorder [episodic paroxysmal anxiety] without agoraphobia: Secondary | ICD-10-CM | POA: Diagnosis present

## 2023-08-19 DIAGNOSIS — Z91041 Radiographic dye allergy status: Secondary | ICD-10-CM

## 2023-08-19 DIAGNOSIS — E162 Hypoglycemia, unspecified: Secondary | ICD-10-CM

## 2023-08-19 DIAGNOSIS — Z8249 Family history of ischemic heart disease and other diseases of the circulatory system: Secondary | ICD-10-CM

## 2023-08-19 DIAGNOSIS — G43909 Migraine, unspecified, not intractable, without status migrainosus: Secondary | ICD-10-CM | POA: Diagnosis present

## 2023-08-19 DIAGNOSIS — F411 Generalized anxiety disorder: Secondary | ICD-10-CM | POA: Diagnosis present

## 2023-08-19 DIAGNOSIS — Z833 Family history of diabetes mellitus: Secondary | ICD-10-CM

## 2023-08-19 DIAGNOSIS — J45909 Unspecified asthma, uncomplicated: Secondary | ICD-10-CM | POA: Diagnosis present

## 2023-08-19 DIAGNOSIS — N809 Endometriosis, unspecified: Secondary | ICD-10-CM | POA: Diagnosis present

## 2023-08-19 DIAGNOSIS — F4310 Post-traumatic stress disorder, unspecified: Secondary | ICD-10-CM | POA: Diagnosis present

## 2023-08-19 DIAGNOSIS — Z87891 Personal history of nicotine dependence: Secondary | ICD-10-CM

## 2023-08-19 DIAGNOSIS — F514 Sleep terrors [night terrors]: Secondary | ICD-10-CM | POA: Diagnosis present

## 2023-08-19 DIAGNOSIS — F445 Conversion disorder with seizures or convulsions: Principal | ICD-10-CM | POA: Diagnosis present

## 2023-08-19 DIAGNOSIS — G8929 Other chronic pain: Secondary | ICD-10-CM | POA: Diagnosis present

## 2023-08-19 DIAGNOSIS — Z5971 Insufficient health insurance coverage: Secondary | ICD-10-CM

## 2023-08-19 DIAGNOSIS — Z825 Family history of asthma and other chronic lower respiratory diseases: Secondary | ICD-10-CM

## 2023-08-19 DIAGNOSIS — R109 Unspecified abdominal pain: Secondary | ICD-10-CM

## 2023-08-19 DIAGNOSIS — F331 Major depressive disorder, recurrent, moderate: Secondary | ICD-10-CM

## 2023-08-19 DIAGNOSIS — M542 Cervicalgia: Secondary | ICD-10-CM | POA: Diagnosis present

## 2023-08-19 LAB — BASIC METABOLIC PANEL
Anion gap: 7 (ref 5–15)
BUN: 13 mg/dL (ref 6–20)
CO2: 21 mmol/L — ABNORMAL LOW (ref 22–32)
Calcium: 8.7 mg/dL — ABNORMAL LOW (ref 8.9–10.3)
Chloride: 109 mmol/L (ref 98–111)
Creatinine, Ser: 0.69 mg/dL (ref 0.44–1.00)
GFR, Estimated: >60 mL/min (ref >60)
Glucose, Bld: 76 mg/dL (ref 70–99)
Potassium: 3.8 mmol/L (ref 3.5–5.1)
Sodium: 137 mmol/L (ref 135–145)

## 2023-08-19 LAB — CBC WITH DIFFERENTIAL/PLATELET
Abs Immature Granulocytes: 0.01 10*3/uL (ref 0.00–0.07)
Basophils Absolute: 0 10*3/uL (ref 0.0–0.1)
Basophils Relative: 1 %
Eosinophils Absolute: 0.1 10*3/uL (ref 0.0–0.5)
Eosinophils Relative: 2 %
HCT: 39.4 % (ref 36.0–46.0)
Hemoglobin: 13.3 g/dL (ref 12.0–15.0)
Immature Granulocytes: 0 %
Lymphocytes Relative: 35 %
Lymphs Abs: 1.7 10*3/uL (ref 0.7–4.0)
MCH: 30.6 pg (ref 26.0–34.0)
MCHC: 33.8 g/dL (ref 30.0–36.0)
MCV: 90.6 fL (ref 80.0–100.0)
Monocytes Absolute: 0.5 10*3/uL (ref 0.1–1.0)
Monocytes Relative: 10 %
Neutro Abs: 2.6 10*3/uL (ref 1.7–7.7)
Neutrophils Relative %: 52 %
Platelets: 154 10*3/uL (ref 150–400)
RBC: 4.35 MIL/uL (ref 3.87–5.11)
RDW: 12.1 % (ref 11.5–15.5)
WBC: 4.9 10*3/uL (ref 4.0–10.5)
nRBC: 0.4 % — ABNORMAL HIGH (ref 0.0–0.2)

## 2023-08-19 LAB — I-STAT CHEM 8, ED
BUN: 13 mg/dL (ref 6–20)
Calcium, Ion: 1.17 mmol/L (ref 1.15–1.40)
Chloride: 107 mmol/L (ref 98–111)
Creatinine, Ser: 0.7 mg/dL (ref 0.44–1.00)
Glucose, Bld: 74 mg/dL (ref 70–99)
HCT: 36 % (ref 36.0–46.0)
Hemoglobin: 12.2 g/dL (ref 12.0–15.0)
Potassium: 3.9 mmol/L (ref 3.5–5.1)
Sodium: 139 mmol/L (ref 135–145)
TCO2: 23 mmol/L (ref 22–32)

## 2023-08-19 LAB — RAPID URINE DRUG SCREEN, HOSP PERFORMED
Amphetamines: NOT DETECTED
Barbiturates: NOT DETECTED
Benzodiazepines: POSITIVE — AB
Cocaine: NOT DETECTED
Opiates: POSITIVE — AB
Tetrahydrocannabinol: POSITIVE — AB

## 2023-08-19 LAB — CBG MONITORING, ED: Glucose-Capillary: 73 mg/dL (ref 70–99)

## 2023-08-19 MED ORDER — KETOROLAC TROMETHAMINE 30 MG/ML IJ SOLN
30.0000 mg | Freq: Once | INTRAMUSCULAR | Status: AC | PRN
  Administered 2023-08-19: 30 mg via INTRAVENOUS
  Filled 2023-08-19: qty 1

## 2023-08-19 MED ORDER — GADOBUTROL 1 MMOL/ML IV SOLN
7.6000 mL | Freq: Once | INTRAVENOUS | Status: AC | PRN
  Administered 2023-08-19: 7.6 mL via INTRAVENOUS

## 2023-08-19 MED ORDER — DROPERIDOL 2.5 MG/ML IJ SOLN
1.2500 mg | Freq: Once | INTRAMUSCULAR | Status: AC
  Administered 2023-08-19: 1.25 mg via INTRAVENOUS
  Filled 2023-08-19: qty 2

## 2023-08-19 MED ORDER — LEVETIRACETAM 500 MG PO TABS: 500.0000 mg | ORAL_TABLET | Freq: Once | ORAL | Status: DC

## 2023-08-19 MED ORDER — GABAPENTIN 400 MG PO CAPS
400.0000 mg | ORAL_CAPSULE | Freq: Three times a day (TID) | ORAL | Status: DC
  Administered 2023-08-19 – 2023-08-20 (×4): 400 mg via ORAL
  Filled 2023-08-19 (×4): qty 1

## 2023-08-19 MED ORDER — DIPHENHYDRAMINE HCL 50 MG/ML IJ SOLN
25.0000 mg | Freq: Once | INTRAMUSCULAR | Status: AC
  Administered 2023-08-19: 25 mg via INTRAVENOUS
  Filled 2023-08-19: qty 1

## 2023-08-19 MED ORDER — TOPIRAMATE 100 MG PO TABS
100.0000 mg | ORAL_TABLET | Freq: Every day | ORAL | Status: DC
  Administered 2023-08-19: 100 mg via ORAL
  Filled 2023-08-19: qty 4

## 2023-08-19 MED ORDER — ONDANSETRON 4 MG PO TBDP
4.0000 mg | ORAL_TABLET | Freq: Once | ORAL | Status: AC
  Administered 2023-08-19: 4 mg via ORAL
  Filled 2023-08-19: qty 1

## 2023-08-19 MED ORDER — SENNOSIDES-DOCUSATE SODIUM 8.6-50 MG PO TABS: 1.0000 | ORAL_TABLET | Freq: Every day | ORAL | Status: DC

## 2023-08-19 MED ORDER — HYDROXYZINE HCL 10 MG PO TABS
10.0000 mg | ORAL_TABLET | Freq: Three times a day (TID) | ORAL | Status: DC | PRN
  Administered 2023-08-20: 10 mg via ORAL
  Filled 2023-08-19: qty 1

## 2023-08-19 MED ORDER — ACETAMINOPHEN 325 MG PO TABS: 650.0000 mg | ORAL_TABLET | Freq: Four times a day (QID) | ORAL | Status: DC | PRN

## 2023-08-19 MED ORDER — CLONAZEPAM 0.5 MG PO TABS: 0.5000 mg | ORAL_TABLET | Freq: Every day | ORAL | Status: DC

## 2023-08-19 MED ORDER — POLYETHYLENE GLYCOL 3350 17 G PO PACK: 17.0000 g | PACK | Freq: Every day | ORAL | Status: DC

## 2023-08-19 MED ORDER — MORPHINE SULFATE (PF) 2 MG/ML IV SOLN
2.0000 mg | INTRAVENOUS | Status: DC | PRN
  Administered 2023-08-19: 2 mg via INTRAVENOUS
  Filled 2023-08-19: qty 1

## 2023-08-19 MED ORDER — BACLOFEN 10 MG PO TABS: 20.0000 mg | ORAL_TABLET | Freq: Every day | ORAL | Status: DC

## 2023-08-19 MED ORDER — ACETAMINOPHEN 500 MG PO TABS: 500.0000 mg | ORAL_TABLET | Freq: Four times a day (QID) | ORAL | Status: DC | PRN

## 2023-08-19 MED ORDER — CLONAZEPAM 0.5 MG PO TABS
0.5000 mg | ORAL_TABLET | Freq: Once | ORAL | Status: AC
  Administered 2023-08-19: 0.5 mg via ORAL
  Filled 2023-08-19: qty 1

## 2023-08-19 MED ORDER — MIDAZOLAM HCL 2 MG/2ML IJ SOLN
2.0000 mg | Freq: Once | INTRAMUSCULAR | Status: AC
Start: 2023-08-19 — End: 2023-08-19

## 2023-08-19 MED ORDER — OXYCODONE HCL 5 MG PO TABS
5.0000 mg | ORAL_TABLET | Freq: Once | ORAL | Status: AC
  Administered 2023-08-19: 5 mg via ORAL
  Filled 2023-08-19: qty 1

## 2023-08-19 MED ORDER — ENOXAPARIN SODIUM 40 MG/0.4ML IJ SOSY
40.0000 mg | PREFILLED_SYRINGE | INTRAMUSCULAR | Status: DC
  Administered 2023-08-19: 40 mg via SUBCUTANEOUS
  Filled 2023-08-19: qty 0.4

## 2023-08-19 MED ORDER — MIDAZOLAM HCL 2 MG/2ML IJ SOLN
INTRAMUSCULAR | Status: AC
  Administered 2023-08-19: 2 mg via INTRAVENOUS
  Filled 2023-08-19: qty 2

## 2023-08-19 MED ORDER — ONDANSETRON HCL 4 MG/2ML IJ SOLN: 4.0000 mg | Freq: Four times a day (QID) | INTRAMUSCULAR | Status: DC | PRN

## 2023-08-19 MED ORDER — LEVETIRACETAM 500 MG PO TABS: 500.0000 mg | ORAL_TABLET | Freq: Two times a day (BID) | ORAL | Status: DC

## 2023-08-19 MED ORDER — SERTRALINE HCL 100 MG PO TABS
200.0000 mg | ORAL_TABLET | Freq: Every day | ORAL | Status: DC
  Administered 2023-08-19: 200 mg via ORAL
  Filled 2023-08-19: qty 2

## 2023-08-19 MED ORDER — LAMOTRIGINE 100 MG PO TABS
100.0000 mg | ORAL_TABLET | Freq: Every day | ORAL | Status: DC
  Administered 2023-08-19 – 2023-08-20 (×2): 100 mg via ORAL
  Filled 2023-08-19 (×2): qty 4

## 2023-08-19 MED ORDER — LEVETIRACETAM 500 MG PO TABS
500.0000 mg | ORAL_TABLET | Freq: Two times a day (BID) | ORAL | Status: DC
  Administered 2023-08-19: 500 mg via ORAL
  Filled 2023-08-19: qty 1

## 2023-08-19 NOTE — ED Notes (Signed)
Upon entering the room patient actively seizing and MD at bedside.

## 2023-08-19 NOTE — ED Provider Notes (Signed)
I was called into the room around 10:30 PM.  Patient apparently had seizure-like activity.  I reviewed her chart and patient was admitted for seizure-like activity.  Patient is admitted to the family practice service.  Patient is on EEG.  By the time I went there, patient already stopped seizing.  There was consideration that she may have pseudoseizures.  No meds were ordered.  Family practice was paged by the nursing.   Charlynne Pander, MD 08/19/23 2258

## 2023-08-19 NOTE — ED Notes (Signed)
Pt had seizure like activity for about a minute. Back to alert and oriented after.
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
## 2023-08-19 NOTE — Assessment & Plan Note (Signed)
Patient will be admitted for workup regarding seizure-like activity.  Neurology was consulted in the ED and we will proceed with plan as outlined in their note as well as the presumptive plan from yesterday. -Admit to FM TS, Dr. Denny Levy attending -Neurology is following, appreciate their recommendations - EEG in process -MRI head -Continue home seizure medications Lamictal 100 mg daily, Keppra 500 mg BID, gabapentin 400 mg TID, topamax 100 mg daily at bedtime -telemetry -vitals per floor - AM BMP, CBC

## 2023-08-19 NOTE — ED Notes (Signed)
Pt having seizure like activity. Family called ED to alert staff, states that she had been seizing for about 6 minutes prior to RN arrival in room. Pt turned on side, suctioned. EDP at bedside and admitting team paged. Pt came out of the seizure and went back into another one, came out of that one and went into another one.
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
## 2023-08-19 NOTE — Hospital Course (Addendum)
[name] is a 31 y.o. female with history of Anxiety w/panic, BPD, PTSD w/ night terrors, and reported seizure disorder 2/2 TBI 2 years ago. Sje was admitted for work-up of seizure-like activity.  Seizure-like activity Multiple seizures at home today prior, initially was admitted at [facility] however left AMA.  She was loaded with Keppra the evening of 12/28.  She returned on 12/29 after more seizure activity, and had witnessed seizure-like activity in the ED and was given Versed which stopped seizures.  Reported seizure history by patient, however no documentation to support diagnosis.  She is currently on Lamictal, gabapentin and topiramate-these were reviewed by pharmacy and found to be samples not prescriptions and indications remained unclear.  These medications were continued ***.  MRI was obtained and was unremarkable.  EEG was obtained and showed***.  Neurology recommended***.  Driving restrictions per [facility] law were discussed in detail with patient and patient expressed understanding.  Other chronic conditions were medically managed with home medications and formulary alternatives as necessary (***)  Follow-up recommendations

## 2023-08-19 NOTE — Assessment & Plan Note (Addendum)
-  reports severe LLQ pain although no significant tenderness on exam. Had hx of gastric bypass.  -CT Abd/pelvis did not demonstrate any acute process -she does report constipation and is on chronic opioids for back pain. Likely source of her pain.  She had reported abdominal pain prior to her decision to leave and so she will be given a dose of IV morphine prior to her departure.

## 2023-08-19 NOTE — Assessment & Plan Note (Deleted)
-  CBG of 47 on presentation in the setting of decrease intake and N/V with hx of gastric bypass -improved with oral intake and D50 -continue to monitor with CBG q4hr checks. If she has recurrent of hypoglycemia will start on dextrose IV fluid

## 2023-08-19 NOTE — Assessment & Plan Note (Signed)
-  CBG of 47 on presentation in the setting of decrease intake and N/V with hx of gastric bypass -improved with oral intake and D50 -stable at discharge and was able to eat a sandwich

## 2023-08-19 NOTE — ED Notes (Signed)
Patient returned from MRI.

## 2023-08-19 NOTE — ED Notes (Signed)
Pt refusing to keep EKG leads, pulse ox, and BP cuff on. States "the doctor told me I didn't have to."

## 2023-08-19 NOTE — Progress Notes (Signed)
LTM EEG hooked up and running - no initial skin breakdown - push button tested - [facility] monitoring.  

## 2023-08-19 NOTE — ED Triage Notes (Signed)
Patient was at [facility] long waiting on a bed here at [facility] for neurology due to increase in seizures.  Patient and friend left because it was taking too long.  Patient came in the front door actively seizing.  Then got to the room and seizures at 1032 and again at 1037.  Reports she takes lamictal and gabapentin for seizures.  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

## 2023-08-19 NOTE — H&P (Incomplete)
History and Physical    Patient: [name]:[id number] DOB: [birth year] DOA: 08/18/2023 DOS: the patient was seen and examined on 08/19/2023 PCP: Jordan Hawks, PA-C  Patient coming from: {Point_of_Origin:26777}  Chief Complaint:  Chief Complaint  Patient presents with  . Seizures   HPI: [name] is a 32 y.o. female with medical history significant of ***  Review of Systems: {ROS_Text:26778} Past Medical History:  Diagnosis Date  . Animal bite of finger    a cat  . Anxiety   . Asthma   . Bronchitis   . Endometriosis   . Insomnia   . PCOS (polycystic ovarian syndrome)   . Postgastrectomy malabsorption   . PTSD (post-traumatic stress disorder)   . Sleep apnea   . Sleep disorder    Past Surgical History:  Procedure Laterality Date  . CESAREAN SECTION N/A 01/20/2014   Procedure: CESAREAN SECTION;  Surgeon: Brock Bad, MD;  Location: [facility];  Service: Obstetrics;  Laterality: N/A;  . GASTRIC BYPASS    . MOUTH SURGERY    . WISDOM TOOTH EXTRACTION     Social History:  reports that she quit smoking about 3 years ago. Her smoking use included cigarettes. She has never used smokeless tobacco. She reports that she does not currently use alcohol. She reports that she does not use drugs.  Allergies  Allergen Reactions  . Bee Venom Anaphylaxis  . Iodine Anaphylaxis  . Tetanus-Diphth-Acell Pertussis Anaphylaxis    + angioedema and hives  . Aripiprazole Other (See Comments)    Worsening depression  . Prazosin Other (See Comments)    Made night terrors worse  . Vaccinium Angustifolium Nausea And Vomiting  . Morphine And Codeine Itching    Family History  Problem Relation Age of Onset  . Asthma Mother   . Diabetes Father   . Heart attack Maternal Grandfather   . Diabetes Paternal Grandmother   . Heart attack Paternal Grandfather     Prior to Admission medications   Medication Sig Start Date End Date Taking? Authorizing Provider  predniSONE  (DELTASONE) 10 MG tablet Take 10 mg by mouth daily. 07/11/23  Yes [provider]  amphetamine-dextroamphetamine (ADDERALL XR) 25 MG 24 hr capsule Take 25 mg by mouth every morning.    [provider]  amphetamine-dextroamphetamine (ADDERALL) 20 MG tablet Take 20 mg by mouth daily. 10/04/22   [provider]  baclofen (LIORESAL) 20 MG tablet Take 1 tablet by mouth at bedtime. 06/27/19   [provider]  Calcium Citrate-Vitamin D 250-200 MG-UNIT TABS Take 1 tablet by mouth daily.    [provider]  Cholecalciferol 125 MCG (5000 UT) capsule Take 2 tablets by mouth daily. 04/28/19   [provider]  clonazePAM (KLONOPIN) 1 MG tablet Take 0.5-1 mg by mouth as directed. 12/12/21   [provider]  EPINEPHrine 0.3 mg/0.3 mL IJ SOAJ injection Inject 0.3 mg into the muscle as needed for anaphylaxis. 06/20/21   [provider]  etonogestrel-ethinyl estradiol (NUVARING) 0.12-0.015 MG/24HR vaginal ring Place 1 each vaginally every 28 (twenty-eight) days. Insert vaginally and leave in place for 3 consecutive weeks, then remove for 1 week.    [provider]  gabapentin (NEURONTIN) 300 MG capsule Take 1 capsule (300 mg total) by mouth 3 (three) times daily for 5 days. 10/15/20 10/20/20  Tonia Brooms, MD  gabapentin (NEURONTIN) 400 MG capsule Take 400 mg by mouth 3 (three) times daily.    [provider]  hydrOXYzine (  ATARAX) 25 MG tablet Take by mouth. 02/19/23   [provider]  lamoTRIgine (LAMICTAL) 100 MG tablet Take 100 mg by mouth daily.    [provider]  lamoTRIgine (LAMICTAL) 25 MG tablet Take 50 mg by mouth at bedtime. 11/30/21   [provider]  Multiple Vitamin (MULTIVITAMIN WITH MINERALS) TABS tablet Take 1 tablet by mouth daily.    [provider]  omeprazole (PRILOSEC) 20 MG capsule Take 1 capsule (20 mg total) by mouth daily. 09/03/22 10/03/22  Long, Arlyss Repress, MD   oxyCODONE-acetaminophen (PERCOCET) 10-325 MG tablet Take 1 tablet by mouth 2 (two) times daily.    [provider]  sertraline (ZOLOFT) 100 MG tablet Take 200 mg by mouth at bedtime.    [provider]  topiramate (TOPAMAX) 100 MG tablet Take 100 mg by mouth at bedtime. 08/22/22   [provider]  albuterol (VENTOLIN HFA) 108 (90 Base) MCG/ACT inhaler Inhale 2 puffs into the lungs every 4 (four) hours as needed for wheezing or shortness of breath. Patient not taking: Reported on 11/02/2020 04/28/20 11/09/20  Horton, Mayer Masker, MD  levalbuterol [facility] HFA) 45 MCG/ACT inhaler Inhale 1-2 puffs into the lungs every 6 (six) hours as needed for wheezing or shortness of breath. Patient not taking: Reported on 11/02/2020  11/09/20  [provider]  potassium chloride (K-DUR) 10 MEQ tablet Take 1 tablet (10 mEq total) by mouth daily. Patient not taking: Reported on 08/16/2015 04/27/15 08/16/15  Alison Murray, MD  potassium chloride SA (KLOR-CON) 20 MEQ tablet Take 1 tablet (20 mEq total) by mouth 2 (two) times daily. Patient not taking: Reported on 11/02/2020 04/28/20 11/09/20  Shon Baton, MD    Physical Exam: Vitals:   08/18/23 2015 08/18/23 2215 08/18/23 2230 08/18/23 2302  BP: (!) 139/92 116/86 120/86   Pulse: 88 (!) 59 61   Resp: 19 20 (!) 22   Temp:    97.9 F (36.6 C)  TempSrc:    Oral  SpO2: 100% 100% 100%   Weight:      Height:       *** Data Reviewed: {Tip this will not be part of the note when signed- Document your independent interpretation of telemetry tracing, EKG, lab, Radiology test or any other diagnostic tests. Add any new diagnostic test ordered today. (Optional):26781} {Results:26384}  Assessment and Plan: No notes have been filed under this hospital service. Service: Hospitalist     Advance Care Planning:   Code Status: Prior ***  Consults: ***  Family Communication: ***  Severity of  Illness: {Observation/Inpatient:21159}  Author: Anselm Jungling, DO 08/19/2023 12:03 AM  For on call review [email or web address].
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
## 2023-08-19 NOTE — ED Notes (Signed)
Sz 10:37am lasted 2 min

## 2023-08-19 NOTE — ED Notes (Signed)
EEG at bedside, patient responsive to voice and following commands but remains sleepy.
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
## 2023-08-19 NOTE — Plan of Care (Signed)
FMTS Interim Progress Note  Paged to bedside by nursing staff around 22:45.  Patient was reportedly having seizure-like activity for about 6 minutes on and off.  These episodes were witnessed by nursing and our team.  Upon entering the room patient was on the phone with her friends and was seen live streaming on Instagram.  Patient arose from her episode with no post-ictal state and was arousable with painful stimuli during these episodes per nursing.  She had 2 more episodes while Dr. Laroy Apple and I were in the room which she woke up from immediately and was apologetic.   After patient awoke from her episode, she stated severe muscle pain throughout her body and that at [facility] ED yesterday they gave her Fentanyl which didn't last very long to help her symptoms. However, the morphine she received afterwards seemed to help her sleep and "kept her from having seizures" since she states the pain is what triggers her seizures. She reportedly takes Oxycodone 10-325 BID but sometimes take its every 4-6 hours PRN. She also takes Baclofen at home at bedtime.  Called Dr. Wilford Corner (neurology) from the ED to notify him so he can look at the EEG. He discussed with me that her behaviors do not align with seizure-like activity and a similar story ("tunnel vision and auras associated with her seizures") was shared with him yesterday at [facility] ED before she left AMA.  He does not think meds are required at this time.  Advised to only use Lorazepam if episodes last greater than 10 minutes.  Due to significant artifact on the EEG and no tech available currently, neurology will follow-up in the morning to discuss EEG findings.  Suspect these episodes are pseudoseizures due to no postictal state and being easily arousable. Will closely monitor with nursing. Still awaiting progressive bed.   Fortunato Curling, DO 08/19/2023, 11:12 PM PGY-1, [facility] Service pager [phone]

## 2023-08-19 NOTE — Plan of Care (Signed)
FMTS Interim Progress Note  S: Notified by nursing staff the patient was leave AMA.  Went to evaluate patient at bedside, she is accompanied by her daughter serenity and ex-husband Molly Maduro.  Upon entering the room, patient began to shake and had foaming at the mouth.  Unable to wake patient, alerted nurse to bedside.  When nurse arrived, shaking had stopped.  Patient was slow to open eyes, but was able to open eyes and was alert and oriented x 3.  Event was captured on EEG.  After patient woke up, I inquired why she went to leave AMA.  She states that she is feeling claustrophobic and uncomfortable due to the amount of wiring of stickers in her skin.  Additionally she has chronic pain has not received her oxycodone.  She states that she does not want to leave AMA, but she is still very uncomfortable and feels like she has no choice.  After discussion, we created a plan to help patient feel more comfortable.  She agrees not to leave AMA at this time.  See below.  O: BP (!) 85/69   Pulse 74   Temp 98.2 F (36.8 C)   Resp 15   Ht 5\' 9"  (1.753 m)   Wt 76.7 kg   SpO2 100%   BMI 24.96 kg/m     A/P: Seizures Event captured on EEG.  Continue home medications and Keppra.  Chronic pain Feels like she will struggle to take p.o. due to nausea. - Zofran 4 mg ODT - 30 minutes after Zofran, will give oxycodone - If oxycodone improves pain, consider scheduled  Anxious mood Remove EKG stickers for comfort Dim lights for comfort Give home Klonopin, half dose due to recent midazolam  Tiffany Kocher, DO 08/19/2023, 6:14 PM PGY-2, [facility] Family [facility] Service pager [phone]

## 2023-08-19 NOTE — ED Provider Notes (Signed)
°  Physical Exam  BP 120/86   Pulse 61   Temp 97.9 F (36.6 C) (Oral)   Resp (!) 22   Ht 5\' 9"  (1.753 m)   Wt 77.1 kg   SpO2 100%   BMI 25.10 kg/m   Physical Exam  Procedures  Procedures  ED Course / MDM    Medical Decision Making Amount and/or Complexity of Data Reviewed Labs: ordered. Radiology: ordered.  Risk OTC drugs. Prescription drug management.   Patient signed out at shift handoff with plans for admission to hospitalist for likely EEG at [facility].  Dr. Cyndia Bent was consulted and agreed to see patient for admission.  Patient decided to leave AMA due to needing to care for her dogs at home.  Patient plans to present back at [facility] once she is able to be seen for admission.       Darrick Grinder, PA-C 08/19/23 0121    Sloan Leiter, DO 08/19/23 0527    Sloan Leiter, DO 08/19/23 [phone]
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
## 2023-08-19 NOTE — ED Provider Notes (Signed)
[place] [facility] DE[facility]MENT AT [facility] Provider Note   CSN: [id number] Arrival date & time: 08/19/23  8295     History  Chief Complaint  Patient presents with   Seizures    BERNETTE DEGEORGE is a 32 y.o. female.  32 yo F with a chief complaints of having multiple seizures.  The patient has a reported history of a seizure disorder.  Is on Lamictal and gabapentin.  She was actually seen yesterday with increased seizure frequency and plans were to have her admitted here for EEG.  The patient unfortunately did not feel like waiting and she went and stated a hotel for a few hours and then came back this morning.  Has had 3 seizures before I have seen her.  She tells me she is compliant with her medications.        Home Medications Prior to Admission medications   Medication Sig Start Date End Date Taking? Authorizing Provider  amphetamine-dextroamphetamine (ADDERALL XR) 25 MG 24 hr capsule Take 25 mg by mouth every morning.   Yes [provider]  amphetamine-dextroamphetamine (ADDERALL) 20 MG tablet Take 20 mg by mouth daily. 10/04/22  Yes [provider]  Ascorbic Acid (VITAMIN C) 1000 MG tablet Take 1,000 mg by mouth daily.   Yes [provider]  baclofen (LIORESAL) 20 MG tablet Take 1 tablet by mouth at bedtime. 06/27/19  Yes [provider]  Biotin 1000 MCG CHEW Chew 1 tablet by mouth daily.   Yes [provider]  Calcium Citrate-Vitamin D 250-200 MG-UNIT TABS Take 1 tablet by mouth daily.   Yes [provider]  clonazePAM (KLONOPIN) 1 MG tablet Take 1 mg by mouth at bedtime. 12/12/21  Yes [provider]  etonogestrel-ethinyl estradiol (NUVARING) 0.12-0.015 MG/24HR vaginal ring Place 1 each vaginally every 28 (twenty-eight) days. Insert vaginally and leave in place for 3 consecutive weeks, then remove for 1 week.   Yes [provider]  gabapentin (NEURONTIN) 300 MG capsule Take 1 capsule (300  mg total) by mouth 3 (three) times daily for 5 days. Patient taking differently: Take 600 mg by mouth at bedtime. 10/15/20 08/19/23 Yes Tonia Brooms, MD  gabapentin (NEURONTIN) 400 MG capsule Take 400 mg by mouth 3 (three) times daily.   Yes [provider]  lamoTRIgine (LAMICTAL) 25 MG tablet Take 75 mg by mouth daily.   Yes [provider]  methocarbamol (ROBAXIN) 750 MG tablet Take 750 mg by mouth 4 (four) times daily.   Yes [provider]  mineral oil enema Place 1 enema rectally once.   Yes [provider]  Multiple Vitamin (MULTIVITAMIN WITH MINERALS) TABS tablet Take 1 tablet by mouth daily.   Yes [provider]  ondansetron (ZOFRAN) 8 MG tablet Take 8 mg by mouth every 8 (eight) hours as needed for nausea or vomiting.   Yes [provider]  oxyCODONE-acetaminophen (PERCOCET) 10-325 MG tablet Take 1 tablet by mouth 2 (two) times daily.   Yes [provider]  rizatriptan (MAXALT-MLT) 10 MG disintegrating tablet Take 10 mg by mouth as needed for migraine. May repeat in 2 hours if needed   Yes [provider]  sertraline (ZOLOFT) 100 MG tablet Take 200 mg by mouth at bedtime.   Yes [provider]  topiramate (TOPAMAX) 100 MG tablet Take 100 mg by mouth at bedtime. 08/22/22  Yes [provider]  vitamin B-12 (CYANOCOBALAMIN) 100 MCG tablet Take 100 mcg by mouth daily.  Yes [provider]  omeprazole (PRILOSEC) 20 MG capsule Take 1 capsule (20 mg total) by mouth daily. Patient not taking: Reported on 08/19/2023 09/03/22 08/19/23  Long, Arlyss Repress, MD  albuterol (VENTOLIN HFA) 108 (90 Base) MCG/ACT inhaler Inhale 2 puffs into the lungs every 4 (four) hours as needed for wheezing or shortness of breath. Patient not taking: Reported on 11/02/2020 04/28/20 11/09/20  Horton, Mayer Masker, MD  levalbuterol [facility] HFA) 45 MCG/ACT inhaler Inhale 1-2 puffs into the lungs every 6 (six) hours as needed for wheezing  or shortness of breath. Patient not taking: Reported on 11/02/2020  11/09/20  [provider]  potassium chloride (K-DUR) 10 MEQ tablet Take 1 tablet (10 mEq total) by mouth daily. Patient not taking: Reported on 08/16/2015 04/27/15 08/16/15  Alison Murray, MD  potassium chloride SA (KLOR-CON) 20 MEQ tablet Take 1 tablet (20 mEq total) by mouth 2 (two) times daily. Patient not taking: Reported on 11/02/2020 04/28/20 11/09/20  Horton, Mayer Masker, MD      Allergies    Bee venom, Iodine, Tetanus-diphth-acell pertussis, Aripiprazole, Prazosin, Vaccinium angustifolium, and Morphine and codeine    Review of Systems   Review of Systems  Physical Exam Updated Vital Signs BP 117/77   Pulse 64   Temp 98.2 F (36.8 C)   Resp 18   Ht 5\' 9"  (1.753 m)   Wt 76.7 kg   SpO2 100%   BMI 24.96 kg/m  Physical Exam Vitals and nursing note reviewed.  Constitutional:      General: She is not in acute distress.    Appearance: She is well-developed. She is not diaphoretic.  HENT:     Head: Normocephalic and atraumatic.  Eyes:     Pupils: Pupils are equal, round, and reactive to light.  Cardiovascular:     Rate and Rhythm: Normal rate and regular rhythm.     Heart sounds: No murmur heard.    No friction rub. No gallop.  Pulmonary:     Effort: Pulmonary effort is normal.     Breath sounds: No wheezing or rales.  Abdominal:     General: There is no distension.     Palpations: Abdomen is soft.     Tenderness: There is no abdominal tenderness.  Musculoskeletal:        General: No tenderness.     Cervical back: Normal range of motion and neck supple.  Skin:    General: Skin is warm and dry.  Neurological:     Mental Status: She is alert and oriented to person, place, and time.  Psychiatric:        Behavior: Behavior normal.     ED Results / Procedures / Treatments   Labs (all labs ordered are listed, but only abnormal results are displayed) Labs Reviewed  CBC WITH  DIFFERENTIAL/PLATELET - Abnormal; Notable for the following components:      Result Value   nRBC 0.4 (*)    All other components within normal limits  BASIC METABOLIC PANEL - Abnormal; Notable for the following components:   CO2 21 (*)    Calcium 8.7 (*)    All other components within normal limits  HIV ANTIBODY (ROUTINE TESTING W REFLEX)  CBC  CREATININE, SERUM  CBG MONITORING, ED  I-STAT CHEM 8, ED    EKG None  Radiology MR Brain W and Wo Contrast Result Date: 08/19/2023 CLINICAL DATA:  Seizure, new onset. EXAM: MRI HEAD WITHOUT AND WITH CONTRAST TECHNIQUE: Multiplanar, multiecho pulse sequences of  the brain and surrounding structures were obtained without and with intravenous contrast. CONTRAST:  7.92mL GADAVIST GADOBUTROL 1 MMOL/ML IV SOLN COMPARISON:  CT head without contrast 08/18/2023. FINDINGS: Brain: No acute infarct, hemorrhage, or mass lesion is present. No significant white matter lesions are present. The ventricles are of normal size. Deep brain nuclei are within normal limits. No significant extraaxial fluid collection is present. The brainstem and cerebellum are within normal limits. The internal auditory canals are within normal limits. Midline structures are within normal limits. Dedicated imaging of the temporal lobes demonstrates symmetric size and signal of the hippocampal structures. No associated enhancement is present. Vascular: Flow is present in the major intracranial arteries. Skull and upper cervical spine: The craniocervical junction is normal. Upper cervical spine is within normal limits. Marrow signal is unremarkable. Sinuses/Orbits: A polyp or mucous retention cyst is present in the left maxillary sinus. The paranasal sinuses and mastoid air cells are otherwise clear. The globes and orbits are within normal limits. IMPRESSION: 1. Normal MRI appearance of the brain. No acute or focal lesion to explain the patient's seizures. 2. Polyp or mucous retention cyst of the  left maxillary sinus. Electronically Signed   By: Marin Roberts M.D.   On: 08/19/2023 13:53   CT ABDOMEN PELVIS W CONTRAST Result Date: 08/18/2023 CLINICAL DATA:  Diffuse abdominal pain EXAM: CT ABDOMEN AND PELVIS WITH CONTRAST TECHNIQUE: Multidetector CT imaging of the abdomen and pelvis was performed using the standard protocol following bolus administration of intravenous contrast. RADIATION DOSE REDUCTION: This exam was performed according to the departmental dose-optimization program which includes automated exposure control, adjustment of the mA and/or kV according to patient size and/or use of iterative reconstruction technique. CONTRAST:  OMNIPAQUE IOHEXOL 300 MG/ML  SOLN COMPARISON:  07/16/2023 FINDINGS: Lower chest: No acute abnormality. Hepatobiliary: No focal liver abnormality is seen. No gallstones, gallbladder wall thickening, or biliary dilatation. Pancreas: Unremarkable. No pancreatic ductal dilatation or surrounding inflammatory changes. Spleen: Normal in size without focal abnormality. Adrenals/Urinary Tract: Adrenal glands are within normal limits. Kidneys demonstrate a normal enhancement pattern bilaterally. No renal calculi or obstructive changes are seen. Bladder is partially distended. Stomach/Bowel: The appendix is within normal limits. No obstructive or inflammatory changes of the colon are seen. Postsurgical changes in the stomach consistent with prior gastric bypass are noted. Small hiatal hernia is noted. Small bowel is within normal limits. Vascular/Lymphatic: No significant vascular findings are present. No enlarged abdominal or pelvic lymph nodes. Reproductive: Uterus and bilateral adnexa are unremarkable. Cerclage ring is noted in place. Other: No abdominal wall hernia or abnormality. No abdominopelvic ascites. Musculoskeletal: No acute or significant osseous findings. IMPRESSION: No acute abnormality noted. No significant interval change from the prior study is  noted. Electronically Signed   By: Alcide Clever M.D.   On: 08/18/2023 23:40   CT Head Wo Contrast Result Date: 08/18/2023 CLINICAL DATA:  Polytrauma, blunt c/o seizures. Pt reports she has been having a few seizures since TBI, usually is triggered by stress EXAM: CT HEAD WITHOUT CONTRAST TECHNIQUE: Contiguous axial images were obtained from the base of the skull through the vertex without intravenous contrast. RADIATION DOSE REDUCTION: This exam was performed according to the departmental dose-optimization program which includes automated exposure control, adjustment of the mA and/or kV according to patient size and/or use of iterative reconstruction technique. COMPARISON:  CT head 07/16/2023 FINDINGS: Brain: No evidence of large-territorial acute infarction. No parenchymal hemorrhage. No mass lesion. No extra-axial collection. No mass effect or midline shift.  No hydrocephalus. Basilar cisterns are patent. Vascular: No hyperdense vessel. Skull: No acute fracture or focal lesion. Sinuses/Orbits: Left maxillary sinus mucosal thickening. Otherwise paranasal sinuses and mastoid air cells are clear. The orbits are unremarkable. Other: None. IMPRESSION: No acute intracranial abnormality. Electronically Signed   By: Tish Frederickson M.D.   On: 08/18/2023 19:44    Procedures .Critical Care  Performed by: Melene Plan, DO Authorized by: Melene Plan, DO   Critical care provider statement:    Critical care time (minutes):  35   Critical care time was exclusive of:  Separately billable procedures and treating other patients   Critical care was time spent personally by me on the following activities:  Development of treatment plan with patient or surrogate, discussions with consultants, evaluation of patient's response to treatment, examination of patient, ordering and review of laboratory studies, ordering and review of radiographic studies, ordering and performing treatments and interventions, pulse oximetry,  re-evaluation of patient's condition and review of old charts   Care discussed with: admitting provider       Medications Ordered in ED Medications  levETIRAcetam (KEPPRA) tablet 500 mg (has no administration in time range)  sertraline (ZOLOFT) tablet 200 mg (has no administration in time range)  gabapentin (NEURONTIN) capsule 400 mg (has no administration in time range)  lamoTRIgine (LAMICTAL) tablet 100 mg (has no administration in time range)  topiramate (TOPAMAX) tablet 100 mg (has no administration in time range)  clonazePAM (KLONOPIN) tablet 0.5-1 mg (has no administration in time range)  clonazePAM (KLONOPIN) tablet 0.5 mg (has no administration in time range)  enoxaparin (LOVENOX) injection 40 mg (has no administration in time range)  midazolam (VERSED) injection 2 mg (2 mg Intravenous Given 08/19/23 1039)  droperidol (INAPSINE) 2.5 MG/ML injection 1.25 mg (1.25 mg Intravenous Given 08/19/23 1046)  diphenhydrAMINE (BENADRYL) injection 25 mg (25 mg Intravenous Given 08/19/23 1042)  gadobutrol (GADAVIST) 1 MMOL/ML injection 7.6 mL (7.6 mLs Intravenous Contrast Given 08/19/23 1345)    ED Course/ Medical Decision Making/ A&P                                 Medical Decision Making Amount and/or Complexity of Data Reviewed Labs: ordered. Radiology: ordered.  Risk Prescription drug management. Decision regarding hospitalization.   32 yo F with a chief complaints of increased seizure frequency.  Patient reportedly has a history of seizures.  Was seen at [facility] last night and was seen by neurology.  Plans for admission and EEG.  The patient ended up leaving the hospital and stayed in a hotel for couple hours and then checked in here this morning.  She reportedly has had 3 seizure-like events prior to my seeing her.  She had 1 seizure after I had completed my history with her.  She told the tech that she felt like she was having 1.  Stiffened up.  Generalized shaking.  She  seemed able to hold her arm still to have her IV placed while this was going on.  She screams and yells immediately afterwards and then seems to be quickly back to baseline.  Only record review she was loaded with Keppra last night.  Will discuss with neurology.  I discussed case with neurology, Dr. Otelia Limes.  Recommended medical admission long-term EEG monitoring and MRI of the brain.  The patients results and plan were reviewed and discussed.   Any x-rays performed were independently reviewed by myself.  Differential diagnosis were considered with the presenting HPI.  Medications  levETIRAcetam (KEPPRA) tablet 500 mg (has no administration in time range)  sertraline (ZOLOFT) tablet 200 mg (has no administration in time range)  gabapentin (NEURONTIN) capsule 400 mg (has no administration in time range)  lamoTRIgine (LAMICTAL) tablet 100 mg (has no administration in time range)  topiramate (TOPAMAX) tablet 100 mg (has no administration in time range)  clonazePAM (KLONOPIN) tablet 0.5-1 mg (has no administration in time range)  clonazePAM (KLONOPIN) tablet 0.5 mg (has no administration in time range)  enoxaparin (LOVENOX) injection 40 mg (has no administration in time range)  midazolam (VERSED) injection 2 mg (2 mg Intravenous Given 08/19/23 1039)  droperidol (INAPSINE) 2.5 MG/ML injection 1.25 mg (1.25 mg Intravenous Given 08/19/23 1046)  diphenhydrAMINE (BENADRYL) injection 25 mg (25 mg Intravenous Given 08/19/23 1042)  gadobutrol (GADAVIST) 1 MMOL/ML injection 7.6 mL (7.6 mLs Intravenous Contrast Given 08/19/23 1345)    Vitals:   08/19/23 1043 08/19/23 1043 08/19/23 1053 08/19/23 1443  BP: 117/77     Pulse: 64     Resp: 18     Temp:  98.2 F (36.8 C)  98.2 F (36.8 C)  TempSrc:  Oral    SpO2: 100%     Weight:   76.7 kg   Height:   5\' 9"  (1.753 m)     Final diagnoses:  Seizure-like activity (HCC)    Admission/ observation were discussed with the admitting physician, patient  and/or family and they are comfortable with the plan.          Final Clinical Impression(s) / ED Diagnoses Final diagnoses:  Seizure-like activity [facility])    Rx / DC Orders ED Discharge Orders     None         Melene Plan, DO 08/19/23 1504

## 2023-08-20 DIAGNOSIS — R569 Unspecified convulsions: Secondary | ICD-10-CM

## 2023-08-20 LAB — CBC
HCT: 37.2 % (ref 36.0–46.0)
Hemoglobin: 12.3 g/dL (ref 12.0–15.0)
MCH: 30.3 pg (ref 26.0–34.0)
MCHC: 33.1 g/dL (ref 30.0–36.0)
MCV: 91.6 fL (ref 80.0–100.0)
Platelets: 145 10*3/uL — ABNORMAL LOW (ref 150–400)
RBC: 4.06 MIL/uL (ref 3.87–5.11)
RDW: 12 % (ref 11.5–15.5)
WBC: 4.9 10*3/uL (ref 4.0–10.5)
nRBC: 0 % (ref 0.0–0.2)

## 2023-08-20 LAB — BASIC METABOLIC PANEL
Anion gap: 9 (ref 5–15)
BUN: 14 mg/dL (ref 6–20)
CO2: 20 mmol/L — ABNORMAL LOW (ref 22–32)
Calcium: 8.3 mg/dL — ABNORMAL LOW (ref 8.9–10.3)
Chloride: 109 mmol/L (ref 98–111)
Creatinine, Ser: 0.87 mg/dL (ref 0.44–1.00)
GFR, Estimated: >60 mL/min (ref >60)
Glucose, Bld: 160 mg/dL — ABNORMAL HIGH (ref 70–99)
Potassium: 3.6 mmol/L (ref 3.5–5.1)
Sodium: 138 mmol/L (ref 135–145)

## 2023-08-20 LAB — HIV ANTIBODY (ROUTINE TESTING W REFLEX): HIV Screen 4th Generation wRfx: NONREACTIVE

## 2023-08-20 MED ORDER — OXYCODONE-ACETAMINOPHEN 10-325 MG PO TABS: 1.0000 | ORAL_TABLET | Freq: Two times a day (BID) | ORAL | Status: DC

## 2023-08-20 MED ORDER — LORAZEPAM 2 MG/ML IJ SOLN
2.0000 mg | Freq: Once | INTRAMUSCULAR | Status: AC
  Administered 2023-08-20: 2 mg via INTRAVENOUS
  Filled 2023-08-20: qty 1

## 2023-08-20 MED ORDER — OXYCODONE HCL 5 MG PO TABS
5.0000 mg | ORAL_TABLET | Freq: Once | ORAL | Status: AC | PRN
  Administered 2023-08-20: 5 mg via ORAL
  Filled 2023-08-20: qty 1

## 2023-08-20 MED ORDER — OXYCODONE-ACETAMINOPHEN 5-325 MG PO TABS: 1.0000 | ORAL_TABLET | Freq: Two times a day (BID) | ORAL | Status: DC

## 2023-08-20 MED ORDER — OXYCODONE HCL 5 MG PO TABS
10.0000 mg | ORAL_TABLET | Freq: Two times a day (BID) | ORAL | Status: DC | PRN
  Administered 2023-08-20: 10 mg via ORAL
  Filled 2023-08-20: qty 2

## 2023-08-20 MED ORDER — ACETAMINOPHEN 325 MG PO TABS
650.0000 mg | ORAL_TABLET | Freq: Four times a day (QID) | ORAL | Status: DC
  Administered 2023-08-20 (×2): 650 mg via ORAL
  Filled 2023-08-20 (×2): qty 2

## 2023-08-20 MED ORDER — LORAZEPAM 2 MG/ML IJ SOLN
1.0000 mg | Freq: Once | INTRAMUSCULAR | Status: DC
  Filled 2023-08-20: qty 1

## 2023-08-20 MED ORDER — OXYCODONE HCL 5 MG PO TABS: 5.0000 mg | ORAL_TABLET | Freq: Two times a day (BID) | ORAL | Status: DC

## 2023-08-20 NOTE — Plan of Care (Signed)
  Problem: Safety: Goal: Ability to remain free from injury will improve Outcome: Progressing   

## 2023-08-20 NOTE — Progress Notes (Signed)
LTM EEG disconnected by LG/SH- no skin breakdown at [facility].

## 2023-08-20 NOTE — ED Notes (Addendum)
Patient experienced pseudoseizure with this RN, one minute in duration. Patient alert and oriented after multiple sternal rubs. ED MD and admitting MD notified. Plan of care unchanged.
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
## 2023-08-20 NOTE — ED Notes (Signed)
EEG at bedside, patient having pseudoseizure lasting 4 minutes in duration. MD notified.

## 2023-08-20 NOTE — ED Notes (Signed)
Neurology at bedside.

## 2023-08-20 NOTE — Assessment & Plan Note (Signed)
Patient admitted for workup regarding seizure-like activity and EEG in place. Multiple event overnight requiring benzodiazepine administration. MRI negative. Suspicious for PNES versus complicated migraine given aura prior to event -Neurology is following, appreciate their recommendations -EEG in process -Order Ativan for seizure like activity > 10 minutes -Continue home seizure medications Lamictal 100 mg daily, Keppra 500 mg BID, gabapentin 400 mg TID, topamax 100 mg daily at bedtime -telemetry -Fall precautions
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
## 2023-08-20 NOTE — ED Notes (Signed)
Pt not seizing when walked by room

## 2023-08-20 NOTE — Plan of Care (Signed)
FMTS Interim Progress Note  Went to bedside with Dr. Laroy Apple after receiving call from RN who reported continued seizures for greater than 10 minutes without any episodes of awakening.  Patient was examined and the episode was witnessed by myself and Dr. Laroy Apple.  Reportedly the patient pressed both the call button and the event button prior to seizing due to having the tunnel vision and aura.  RN and NT at bedside.  2 mg Ativan ordered and given by RN.  NT reported that patient was able to keep her hand up when she put her arm up.  Approximately 1 minute prior to receiving the Ativan, the seizure activity diminished slightly and patient was slowly awakening.  After receiving the Ativan, patient was able to have a conversation with [place] regarding her episode.  She reports that they normally last about 2 to 3 minutes however this seems to have lasted longer and her body is in pain to the bone.  She discussed that Ativan normally helps her episodes along with Toradol and oxycodone at home.  Fortunato Curling, DO 08/20/2023, 5:59 AM PGY-1, [facility] pager [phone]

## 2023-08-20 NOTE — Consult Note (Signed)
[facility] Psychiatric Consult Initial  Patient Name: .[name]  MRN: [id number]  DOB: 24-Sep-1990  Consult Order details:  Orders (From admission, onward)     Start     Ordered   08/20/23 1032  IP CONSULT TO PSYCHIATRY       Ordering Provider: Elberta Fortis, MD  Provider:  (Not yet assigned)  Question Answer Comment  Location [facility]   Reason for Consult? Severe anxiety triggering likely PNES, Neuro recommending input to bridge patient to outpatient therapy to reduce pseudoseizure episode burden      08/20/23 1032             Mode of Visit: In person    Psychiatry Consult Evaluation  Service Date: August 20, 2023 LOS:  LOS: 1 day  Chief Complaint "They told me I was faking"  Primary Psychiatric Diagnoses  PNES 2.  H/o borderline peronality d/o, PTSD, bipolar disorder  Assessment  [name] is a 32 y.o. female admitted: Medicallyfor 08/19/2023 10:11 AM for seizurelike activity. She carries the psychiatric diagnoses of borderline personality disorder, PTSD and has a past medical history of  asthma, bronchitis, endometriosis, insomnia, PCOS, post-gastrectomy malabsorption and sleep apnea.   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 She meets criteria for borderline personality d/o, PTSD, and depression based on interview and historic data (chart review).  Current outpatient psychotropic medications include adderall lamital, klonopin and sertraline and historically she has had a fair response to these medications - able to identify several psychosocial stressors that contributed to uptick in seizurelike episodes. She was compliant with medications prior to admission as evidenced by pt report. Role of psychiatry for this encounter was mostly to have a sit-down conversation explaining and de stigmatizing PNES diagnosis and providing bridge to outpt care. Unfortunately, pt left AMA an  hour or two after being seen by psychiatry - had put in resources for [facility] (for uninsured pts) prior to leaving.   Diagnoses:  Active Hospital problems: Principal Problem:   Seizure-like activity [facility]) Active Problems:   Moderate recurrent major depression (HCC)    Plan   ## Psychiatric Medication Recommendations:  -- none   ## Medical Decision Making Capacity: Not specifically addressed in this encounter  ## Further Work-up:  -- pt left AMA prior to note being written - stated up to date on post-bariatric surgery vitamin labs  -- most recent EKG on 11/25 had QtC of 427 -- Pertinent labwork reviewed earlier this admission includes: ethanol <10, UDS + opiates, BZD, THC (interestingly not for amphetamines)   ## Disposition:-- There are no psychiatric contraindications to discharge at this time  ## Behavioral / Environmental: -Recommend using specific terminology regarding PNES, i.e. call the episodes "non-epileptic seizures" rather than "pseudoseizures" as the latter insinuates "fake" or "feigned" symptoms, when the events are a very real experience to the patient and are a physical, non-volitional, manifestation of fear, pain and anxiety.     ## Safety and Observation Level:  - Based on my clinical evaluation, I estimate the patient to be at low risk of self harm in the current setting. - At this time, we recommend  routine. This decision is based on my review of the chart including patient's history and current presentation, interview of the patient, mental status examination, and consideration of suicide risk including evaluating suicidal ideation, plan, intent, suicidal or self-harm behaviors, risk factors, and protective factors. This judgment is based on our ability to  directly address suicide risk, implement suicide prevention strategies, and develop a safety plan while the patient is in the clinical setting. Please contact our team if there is a concern that risk level has  changed.  CSSR Risk Category:C-SSRS RISK CATEGORY: No Risk  Suicide Risk Assessment: Patient has following modifiable risk factors for suicide: under treated depression  and lack of access to outpatient mental health resources, which we are addressing by providing resources prior to leaving. Patient has following non-modifiable or demographic risk factors for suicide: history of trauma Patient has the following protective factors against suicide: Supportive family and Minor children in the home, practices gun safety  Thank you for this consult request. Recommendations have been communicated to the primary team.  We will continue to follow at this time (was intending to see pt in AM prior to pt leaving AMA).   Bindi Klomp A Finnley Lewis       History of Present Illness  Relevant Aspects of [facility] Hospital Course:  Admitted on 08/19/2023 for seizurelike activity. They received a diagnosis of PNES this AM.   Patient Report:  Many thanks to Dr. Standley Brooking who scribed an earlier version of this note. Notably, went to see pt shortly after lunch in ED and she was allegedly 7-8 minutes into seizurelike episode (was facetiming with friends) - excused self and got nurse.   Spent bulk of visit discussing PNES diagnosis and de-stigmatizing it (pt had reportedly been accused of "faking" by transport staff) as well as gathering enough focused psychiatric history to determine if pt met criteria for inpt psych - as such there are some holes in history below after pt left AMA.  She reports feeling isolated and invalidated following diagnosis; helped to work through illness script (ie comparing to migraines, BPD, etc). Identified that pt tends to internalize over externalize sx. Responded fairly well to this while I was in the room. Discussed role of therapy and how this can be relatively trauma-agnostic (ie do not delve into history so much as teaching coping mechanisms/dialectics).   She identified a work trigger,  then had cluster of seizures but then it was difficult to get out of the seizure pattern - went to [facility] and was allegedly told she could spend the night in a hotel and show up for a planned admission at [facility]; when she showed up here found she had been marked as leafving AMA. Then, she went to hotel to calm down. When she came back stated that she went through the ED and they weren't listening to her and she was restrained. States was feeling backed up int he room.    Understands saying seizures aren't showing on EEG and are non-epileptic in nature. States seizures are exacerbated by stress, lack of sleep. Upset about the person who transported her from ED to roomher she was faking it. Discussed diagnosis of conversion disorder in contrast to PNES - discussed we do not believe her sx are consciously created. Reports childhood trauma. Discussed PNES diagnosis as a stress response. States she can go months and weeks without seizures but in high stress situations she will have 4-6 seizures. States she was having her seizures in a cluster pattern. States tried coping skills and it didn't work (indicates at least some buy-in to PNES dx). 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 Losing weight from malabsorption. Discuss Klonopin 0.5 PRN in addition to 0.5 qAM/1QHS - discussed she would not be prescribed extra BZD at dc (  had signed a controlled substance contract as outpt) and we would not  recommend or provide IV BZD. States it's been years of the klonopin working at current dose.Discussed treatment of PNES with therapy skills teaching. States working on changing situations related to PNES triggers. States went 24 hours without meds, stated went to work and didn't take meds with her. Asks about why her SSRI is prescribed - discussed most likely for depression or PTSD. Discussed indications for SSRIs. Reports drinking once every 2-3 months. States doing B12 injections at one point  and believes she is up to date with micronutrient labs s/p gastric bypass    Psych ROS:  Depression: Pt states sx fairly well managed on current regimen globally, worse in last month. No SI Anxiety:  Frequent panic attacks Mania (lifetime and current): Denied - 1 period of increased spending and drinking after brother's death with self resolved Psychosis: (lifetime and current): mostly endorsed dissociative sx, no psychotic sx/hallucinations.   Collateral information:  Discussed case w/ primary team and neuro - pt had some interest in husband being contacted but left AMA.   ROS  Mostly significant for MSK pain.   Psychiatric and Social History  Psychiatric History:  Information collected from pt, medical record  Prev Dx/Sx: borderline personality d/o, PTSD, ADHD Current Psych Provider: Ralene Ok, PA-C (sees for primary care) Home Meds (current): lamictal, sertraline, klonopin, adderall Previous Med Trials: unclear Therapy: not currently - mostly friends/family without structure.   Prior Psych Hospitalization: denied per EMR Prior Self Harm: denied per EMR - has infrequent ideations without intent or plan Prior Violence: did not endorse prior violent episodes  Family Psych History: father with bipolar Family Hx suicide: did not endorse  Social History:  Developmental Hx: deferred Educational Hx: deferred Occupational Hx: vet Education officer, museum Hx: deferred Living Situation: with husband Spiritual Hx: deferred Access to weapons/lethal means: pt's guns at home are locked up. Pt discussed her gun safe practices at length (surrenders to husband, locked up separate from ammo, etc).    Substance History Alcohol: 2-3 drinks every 2-3 months  Type of alcohol liquor Last Drink a couple of months ago Number of drinks per day n/a History of alcohol withdrawal seizures deferred History of DT's deferred Tobacco: quit a couple years ago Illicit drugs: denied Prescription drug  abuse: denied Rehab hx: deferred  Exam Findings  Physical Exam: Vital Signs:  Temp:  [97.2 F (36.2 C)-99.3 F (37.4 C)] 98 F (36.7 C) (12/30 1551) Pulse Rate:  [52-95] 59 (12/30 1551) Resp:  [15-29] 18 (12/30 1551) BP: (92-143)/(55-94) 139/77 (12/30 1551) SpO2:  [95 %-100 %] 98 % (12/30 1551) Blood pressure 139/77, pulse (!) 59, temperature 98 F (36.7 C), temperature source Oral, resp. rate 18, height 5\' 9"  (1.753 m), weight 76.7 kg, SpO2 98%. Body mass index is 24.96 kg/m.  Physical Exam  Mental Status Exam: General Appearance: Fairly Groomed  Orientation:  Full (Time, Place, and Person)  Memory:  Immediate;   Good Recent;   Good Remote;   Good  Concentration:  Concentration: Good  Recall:  Good  Attention  Fair  Eye Contact:  Good  Speech:  Clear and Coherent  Language:  Good  Volume:  Normal  Mood: Unheard, marginalized, betrayed (paraphrasing)  Affect:  Appropriate, Congruent, and Full Range - mostly depressed/anxious but brightened appropriately   Thought Process:  Coherent and Goal Directed  Thought Content:   devoid of delusions- v suspiciosu of healthcare staff but mostly wnl considering prior traumas  Suicidal Thoughts:  No  Homicidal Thoughts:  No  Judgement:  Fair  Insight:  Fair  Psychomotor Activity:  Normal  Akathisia:  No  Fund of Knowledge:  Good      Other History   These have been pulled in through the EMR, reviewed, and updated if appropriate.  Family History:  The patient's family history includes Asthma in her mother; Diabetes in her father and paternal grandmother; Heart attack in her maternal grandfather and paternal grandfather.  Medical History: Past Medical History:  Diagnosis Date   Animal bite of finger    a cat   Anxiety    Asthma    Bronchitis    Endometriosis    Insomnia    PCOS (polycystic ovarian syndrome)    Postgastrectomy malabsorption    PTSD (post-traumatic stress disorder)    Sleep apnea    Sleep disorder      Surgical History: Past Surgical History:  Procedure Laterality Date   CESAREAN SECTION N/A 01/20/2014   Procedure: CESAREAN SECTION;  Surgeon: Brock Bad, MD;  Location: [facility];  Service: Obstetrics;  Laterality: N/A;   GASTRIC BYPASS     MOUTH SURGERY     WISDOM TOOTH EXTRACTION       Medications:   Current Facility-Administered Medications:    acetaminophen (TYLENOL) tablet 650 mg, 650 mg, Oral, Q6H, Elberta Fortis, MD, 650 mg at 08/20/23 1548   clonazePAM (KLONOPIN) tablet 0.5-1 mg, 0.5-1 mg, Oral, QHS, Bronson, Martin, DO   enoxaparin (LOVENOX) injection 40 mg, 40 mg, Subcutaneous, Q24H, Tiffany Kocher, DO, 40 mg at 08/19/23 2118   gabapentin (NEURONTIN) capsule 400 mg, 400 mg, Oral, TID, Tiffany Kocher, DO, 400 mg at 08/20/23 1548   hydrOXYzine (ATARAX) tablet 10 mg, 10 mg, Oral, TID PRN, Levin Erp, MD, 10 mg at 08/20/23 0438   lamoTRIgine (LAMICTAL) tablet 100 mg, 100 mg, Oral, Daily, Tiffany Kocher, DO, 100 mg at 08/20/23 1227   LORazepam (ATIVAN) injection 1 mg, 1 mg, Intravenous, Once, Erick Blinks, MD   oxyCODONE (Oxy IR/ROXICODONE) immediate release tablet 10 mg, 10 mg, Oral, BID PRN, Levin Erp, MD, 10 mg at 08/20/23 1832   sertraline (ZOLOFT) tablet 200 mg, 200 mg, Oral, QHS, Bronson, Martin, DO, 200 mg at 08/19/23 2118   topiramate (TOPAMAX) tablet 100 mg, 100 mg, Oral, QHS, Tiffany Kocher, DO, 100 mg at 08/19/23 2117  Current Outpatient Medications:    amphetamine-dextroamphetamine (ADDERALL XR) 25 MG 24 hr capsule, Take 25 mg by mouth every morning., Disp: , Rfl:    amphetamine-dextroamphetamine (ADDERALL) 20 MG tablet, Take 20 mg by mouth daily., Disp: , Rfl:    Ascorbic Acid (VITAMIN C) 1000 MG tablet, Take 1,000 mg by mouth daily., Disp: , Rfl:    baclofen (LIORESAL) 20 MG tablet, Take 1 tablet by mouth at bedtime., Disp: , Rfl:    Biotin 1000 MCG CHEW, Chew 1 tablet by mouth daily., Disp: , Rfl:    Calcium Citrate-Vitamin D 250-200  MG-UNIT TABS, Take 1 tablet by mouth daily., Disp: , Rfl:    clonazePAM (KLONOPIN) 1 MG tablet, Take 1 mg by mouth at bedtime., Disp: , Rfl:    etonogestrel-ethinyl estradiol (NUVARING) 0.12-0.015 MG/24HR vaginal ring, Place 1 each vaginally every 28 (twenty-eight) days. Insert vaginally and leave in place for 3 consecutive weeks, then remove for 1 week., Disp: , Rfl:    gabapentin (NEURONTIN) 300 MG capsule, Take 1 capsule (300 mg total) by mouth 3 (three) times daily for 5 days. (Patient taking  differently: Take 600 mg by mouth at bedtime.), Disp: 15 capsule, Rfl: 0   gabapentin (NEURONTIN) 400 MG capsule, Take 400 mg by mouth 3 (three) times daily., Disp: , Rfl:    lamoTRIgine (LAMICTAL) 25 MG tablet, Take 75 mg by mouth daily., Disp: , Rfl:    methocarbamol (ROBAXIN) 750 MG tablet, Take 750 mg by mouth 4 (four) times daily., Disp: , Rfl:    mineral oil enema, Place 1 enema rectally once., Disp: , Rfl:    Multiple Vitamin (MULTIVITAMIN WITH MINERALS) TABS tablet, Take 1 tablet by mouth daily., Disp: , Rfl:    ondansetron (ZOFRAN) 8 MG tablet, Take 8 mg by mouth every 8 (eight) hours as needed for nausea or vomiting., Disp: , Rfl:    oxyCODONE-acetaminophen (PERCOCET) 10-325 MG tablet, Take 1 tablet by mouth 2 (two) times daily., Disp: , Rfl:    rizatriptan (MAXALT-MLT) 10 MG disintegrating tablet, Take 10 mg by mouth as needed for migraine. May repeat in 2 hours if needed, Disp: , Rfl:    sertraline (ZOLOFT) 100 MG tablet, Take 200 mg by mouth at bedtime., Disp: , Rfl:    topiramate (TOPAMAX) 100 MG tablet, Take 100 mg by mouth at bedtime., Disp: , Rfl:    vitamin B-12 (CYANOCOBALAMIN) 100 MCG tablet, Take 100 mcg by mouth daily., Disp: , Rfl:    omeprazole (PRILOSEC) 20 MG capsule, Take 1 capsule (20 mg total) by mouth daily. (Patient not taking: Reported on 08/19/2023), Disp: 30 capsule, Rfl: 0  Allergies: Allergies  Allergen Reactions   Bee Venom Anaphylaxis   Iodine Anaphylaxis    Tetanus-Diphth-Acell Pertussis Anaphylaxis    + angioedema and hives   Aripiprazole Other (See Comments)    Worsening depression   Prazosin Other (See Comments)    Made night terrors worse   Vaccinium Angustifolium Nausea And Vomiting   Morphine And Codeine Itching    Claris Che A Faatima Tench

## 2023-08-20 NOTE — ED Notes (Signed)
Patient experienced another pseudoseizure with this RN, 2 minutes in duration, alert and oriented directly after. No change in HR. Admitting MD notified.

## 2023-08-20 NOTE — ED Notes (Signed)
After multiple seizures this evening the pt would now like to get out of be and walk to the bathroom. This is an safety issue. This RN educated pt on the safety issue and offered the bed pan. This RN also discussed this with the Charge RN who also agreed, this is an safety issue and pt will need to use the bed pan. Pt states that she would like to leave AMA if not able to walk to bathroom. MD in charge of pt care was contacted and is on way down to speak with pt.

## 2023-08-20 NOTE — Progress Notes (Signed)
While virtually attempting to complete admission questions with the pt, pt stated that she had her home medications in her purse, and when told they needed to be sent home or sent to pharmacy, she stated that she "did have" her medication in her purse, but only had 2 vit-C pills, but they were no longer in her purse. Informed her of the hospital policy. When asked if she felt safe in her home environment, she hesitantly stated "yes" that she felt safe. (I previously asked if she was alone in the room and she stated "yes"). My time with pt was interrupted by Psych MD entering the room for consult. Bedside RN made aware of my interactions with pt. Will attempt to re-ask and reassess the pt.

## 2023-08-20 NOTE — ED Notes (Signed)
Admitting MD at bedside . Seizures continues.

## 2023-08-20 NOTE — ED Notes (Addendum)
Admitting MD notified on patient's persistent seizures .

## 2023-08-20 NOTE — ED Notes (Signed)
Per MD Khaliqdina, no ativan to be given at this time. Ativan held by this RN.

## 2023-08-20 NOTE — ED Notes (Signed)
ED TO INPATIENT HANDOFF REPORT  ED Nurse Name and Phone #: Dahlia Client [phone]  S Name/Age/Gender [name] 32 y.o. female Room/Bed: 019C/019C  Code Status   Code Status: Full Code  Home/SNF/Other Home Patient oriented to: self, place, time, and situation Is this baseline? Yes   Triage Complete: Triage complete  Chief Complaint Seizure-like activity [facility]) [R56.9]  Triage Note Patient was at [facility] long waiting on a bed here at [facility] for neurology due to increase in seizures.  Patient and friend left because it was taking too long.  Patient came in the front door actively seizing.  Then got to the room and seizures at 1032 and again at 1037.  Reports she takes lamictal and gabapentin for seizures.  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     Allergies Allergies  Allergen Reactions   Bee Venom Anaphylaxis   Iodine Anaphylaxis   Tetanus-Diphth-Acell Pertussis Anaphylaxis    + angioedema and hives   Aripiprazole Other (See Comments)    Worsening depression   Prazosin Other (See Comments)    Made night terrors worse   Vaccinium Angustifolium Nausea And Vomiting   Morphine And Codeine Itching    Level of Care/Admitting Diagnosis ED Disposition     ED Disposition  Admit   Condition  --   Comment  Hospital Area: [facility] [[id number]]  Level of Care: Progressive [102]  Admit to Progressive based on following criteria: NEUROLOGICAL AND NEUROSURGICAL complex patients with significant risk of instability, who do not meet ICU criteria, yet require close observation or frequent assessment (< / = every 2 - 4 hours) with medical / nursing intervention.  May admit patient to [facility] or [facility] if equivalent level of care is available:: No  Covid Evaluation: Asymptomatic - no recent exposure (last 10 days) testing not required  Diagnosis: Seizure-like activity [facility]) [[id number]]   Admitting Physician: Tiffany Kocher [[id number]]  Attending Physician: Nestor Ramp [[id number]]  Certification:: I certify this patient will need inpatient services for at least 2 midnights  Expected Medical Readiness: 08/21/2023          B Medical/Surgery History Past Medical History:  Diagnosis Date   Animal bite of finger    a cat   Anxiety    Asthma    Bronchitis    Endometriosis    Insomnia    PCOS (polycystic ovarian syndrome)    Postgastrectomy malabsorption    PTSD (post-traumatic stress disorder)    Sleep apnea    Sleep disorder    Past Surgical History:  Procedure Laterality Date   CESAREAN SECTION N/A 01/20/2014   Procedure: CESAREAN SECTION;  Surgeon: Brock Bad, MD;  Location: [facility];  Service: Obstetrics;  Laterality: N/A;   GASTRIC BYPASS     MOUTH SURGERY     WISDOM TOOTH EXTRACTION       A IV Location/Drains/Wounds Patient Lines/Drains/Airways Status     Active Line/Drains/Airways     Name Placement date Placement time Site Days   Peripheral IV 08/19/23 18 G Right Antecubital 08/19/23  1039  Antecubital  1            Intake/Output Last 24 hours No intake or output data in the 24 hours ending 08/20/23 1351  Labs/Imaging Results for orders placed or performed during the hospital encounter of 08/19/23 (from the past 48 hours)  CBC with Differential     Status:  Abnormal   Collection Time: 08/19/23 10:39 AM  Result Value Ref Range   WBC 4.9 4.0 - 10.5 K/uL   RBC 4.35 3.87 - 5.11 MIL/uL   Hemoglobin 13.3 12.0 - 15.0 g/dL   HCT 64.3 32.9 - 51.8 %   MCV 90.6 80.0 - 100.0 fL   MCH 30.6 26.0 - 34.0 pg   MCHC 33.8 30.0 - 36.0 g/dL   RDW 84.1 66.0 - 63.0 %   Platelets 154 150 - 400 K/uL   nRBC 0.4 (H) 0.0 - 0.2 %   Neutrophils Relative % 52 %   Neutro Abs 2.6 1.7 - 7.7 K/uL   Lymphocytes Relative 35 %   Lymphs Abs 1.7 0.7 - 4.0 K/uL   Monocytes Relative 10 %   Monocytes Absolute 0.5 0.1 - 1.0 K/uL   Eosinophils Relative 2 %   Eosinophils  Absolute 0.1 0.0 - 0.5 K/uL   Basophils Relative 1 %   Basophils Absolute 0.0 0.0 - 0.1 K/uL   Immature Granulocytes 0 %   Abs Immature Granulocytes 0.01 0.00 - 0.07 K/uL    Comment: Performed at [facility], [address] [place], [place] [address]  Basic metabolic panel     Status: Abnormal   Collection Time: 08/19/23 10:39 AM  Result Value Ref Range   Sodium 137 135 - 145 mmol/L   Potassium 3.8 3.5 - 5.1 mmol/L   Chloride 109 98 - 111 mmol/L   CO2 21 (L) 22 - 32 mmol/L   Glucose, Bld 76 70 - 99 mg/dL    Comment: Glucose reference range applies only to samples taken after fasting for at least 8 hours.   BUN 13 6 - 20 mg/dL   Creatinine, Ser 9.32 0.44 - 1.00 mg/dL   Calcium 8.7 (L) 8.9 - 10.3 mg/dL   GFR, Estimated >35 >57 mL/min    Comment: (NOTE) Calculated using the CKD-EPI Creatinine Equation (2021)    Anion gap 7 5 - 15    Comment: Performed at [facility], [address], [place], [place] [address]  CBG monitoring, ED     Status: None   Collection Time: 08/19/23 10:41 AM  Result Value Ref Range   Glucose-Capillary 73 70 - 99 mg/dL    Comment: Glucose reference range applies only to samples taken after fasting for at least 8 hours.  I-stat chem 8, ED (not at [facility], [facility] or [facility])     Status: None   Collection Time: 08/19/23 10:50 AM  Result Value Ref Range   Sodium 139 135 - 145 mmol/L   Potassium 3.9 3.5 - 5.1 mmol/L   Chloride 107 98 - 111 mmol/L   BUN 13 6 - 20 mg/dL   Creatinine, Ser 5.42 0.44 - 1.00 mg/dL   Glucose, Bld 74 70 - 99 mg/dL    Comment: Glucose reference range applies only to samples taken after fasting for at least 8 hours.   Calcium, Ion 1.17 1.15 - 1.40 mmol/L   TCO2 23 22 - 32 mmol/L   Hemoglobin 12.2 12.0 - 15.0 g/dL   HCT 70.6 23.7 - 62.8 %  CBC     Status: Abnormal   Collection Time: 08/20/23  4:51 AM  Result Value Ref Range   WBC 4.9 4.0 - 10.5 K/uL   RBC 4.06 3.87 - 5.11 MIL/uL   Hemoglobin 12.3 12.0 - 15.0 g/dL   HCT 31.5  17.6 - 16.0 %   MCV 91.6 80.0 - 100.0 fL  MCH 30.3 26.0 - 34.0 pg   MCHC 33.1 30.0 - 36.0 g/dL   RDW 16.1 09.6 - 04.5 %   Platelets 145 (L) 150 - 400 K/uL   nRBC 0.0 0.0 - 0.2 %    Comment: Performed at [facility], [address] [place], [place] [address]  Basic metabolic panel     Status: Abnormal   Collection Time: 08/20/23  4:51 AM  Result Value Ref Range   Sodium 138 135 - 145 mmol/L   Potassium 3.6 3.5 - 5.1 mmol/L   Chloride 109 98 - 111 mmol/L   CO2 20 (L) 22 - 32 mmol/L   Glucose, Bld 160 (H) 70 - 99 mg/dL    Comment: Glucose reference range applies only to samples taken after fasting for at least 8 hours.   BUN 14 6 - 20 mg/dL   Creatinine, Ser 1.91 0.44 - 1.00 mg/dL   Calcium 8.3 (L) 8.9 - 10.3 mg/dL   GFR, Estimated >47 >82 mL/min    Comment: (NOTE) Calculated using the CKD-EPI Creatinine Equation (2021)    Anion gap 9 5 - 15    Comment: Performed at [facility], [address] [place], [place] [address]   Overnight EEG with video Result Date: 08/20/2023 Charlsie Quest, MD     08/20/2023  9:44 AM Patient Name: AMANI ALPER MRN: [id number] Epilepsy Attending: Charlsie Quest Referring Physician/Provider: Caryl Pina, MD Duration: 08/19/2023 1154 to 08/20/2023 9629 Patient history: 32yo F with seizure like activity getting eeg to evaluate for seizure Level of alertness: Awake, asleep AEDs during EEG study: LEV, LTG, GBP, Ativan Technical aspects: This EEG study was done with scalp electrodes positioned according to the 10-20 International system of electrode placement. Electrical activity was reviewed with band pass filter of 1-70Hz , sensitivity of 7 uV/mm, display speed of 27mm/sec with a 60Hz  notched filter applied as appropriate. EEG data were recorded continuously and digitally stored.  Video monitoring was available and reviewed as appropriate. Description: The posterior dominant rhythm consists of 9 Hz activity of moderate voltage (25-35 uV)  seen predominantly in posterior head regions, symmetric and reactive to eye opening and eye closing. Sleep was characterized by vertex waves, sleep spindles (12 to 14 Hz), maximal frontocentral region.  There is an excessive amount of 15 to 18 Hz beta activity distributed symmetrically and diffusely. Event button was pressed on 08/19/2023 at 2228, 2259, 2301, 2311 and on 08/20/2023 at 0439, 0504, 0506, 0604, 0612, 0809, 0825 and 0900. Patient reported tunnel vision. She then had subtle head tremor followed  by whole body tremor like movements, eyes closed, not responding.  Concomitant EEG before, during and after the events showed normal posterior dominant rhythm. Hyperventilation and photic stimulation were not performed.   ABNORMALITY - Excessive beta, generalized IMPRESSION: This study is within normal limits. The excessive beta activity seen in the background is most likely due to the effect of benzodiazepine and is a benign EEG pattern. No seizures or epileptiform discharges were seen throughout the recording. Multiple events were recorded as described above without concomitant eeg change. These events were NON epileptic. Charlsie Quest   MR Brain W and Wo Contrast Result Date: 08/19/2023 CLINICAL DATA:  Seizure, new onset. EXAM: MRI HEAD WITHOUT AND WITH CONTRAST TECHNIQUE: Multiplanar, multiecho pulse sequences of the brain and surrounding structures were obtained without and with intravenous contrast. CONTRAST:  7.32mL GADAVIST GADOBUTROL 1 MMOL/ML IV SOLN COMPARISON:  CT head without contrast 08/18/2023.  FINDINGS: Brain: No acute infarct, hemorrhage, or mass lesion is present. No significant white matter lesions are present. The ventricles are of normal size. Deep brain nuclei are within normal limits. No significant extraaxial fluid collection is present. The brainstem and cerebellum are within normal limits. The internal auditory canals are within normal limits. Midline structures are within normal  limits. Dedicated imaging of the temporal lobes demonstrates symmetric size and signal of the hippocampal structures. No associated enhancement is present. Vascular: Flow is present in the major intracranial arteries. Skull and upper cervical spine: The craniocervical junction is normal. Upper cervical spine is within normal limits. Marrow signal is unremarkable. Sinuses/Orbits: A polyp or mucous retention cyst is present in the left maxillary sinus. The paranasal sinuses and mastoid air cells are otherwise clear. The globes and orbits are within normal limits. IMPRESSION: 1. Normal MRI appearance of the brain. No acute or focal lesion to explain the patient's seizures. 2. Polyp or mucous retention cyst of the left maxillary sinus. Electronically Signed   By: Marin Roberts M.D.   On: 08/19/2023 13:53   CT ABDOMEN PELVIS W CONTRAST Result Date: 08/18/2023 CLINICAL DATA:  Diffuse abdominal pain EXAM: CT ABDOMEN AND PELVIS WITH CONTRAST TECHNIQUE: Multidetector CT imaging of the abdomen and pelvis was performed using the standard protocol following bolus administration of intravenous contrast. RADIATION DOSE REDUCTION: This exam was performed according to the departmental dose-optimization program which includes automated exposure control, adjustment of the mA and/or kV according to patient size and/or use of iterative reconstruction technique. CONTRAST:  OMNIPAQUE IOHEXOL 300 MG/ML  SOLN COMPARISON:  07/16/2023 FINDINGS: Lower chest: No acute abnormality. Hepatobiliary: No focal liver abnormality is seen. No gallstones, gallbladder wall thickening, or biliary dilatation. Pancreas: Unremarkable. No pancreatic ductal dilatation or surrounding inflammatory changes. Spleen: Normal in size without focal abnormality. Adrenals/Urinary Tract: Adrenal glands are within normal limits. Kidneys demonstrate a normal enhancement pattern bilaterally. No renal calculi or obstructive changes are seen. Bladder is  partially distended. Stomach/Bowel: The appendix is within normal limits. No obstructive or inflammatory changes of the colon are seen. Postsurgical changes in the stomach consistent with prior gastric bypass are noted. Small hiatal hernia is noted. Small bowel is within normal limits. Vascular/Lymphatic: No significant vascular findings are present. No enlarged abdominal or pelvic lymph nodes. Reproductive: Uterus and bilateral adnexa are unremarkable. Cerclage ring is noted in place. Other: No abdominal wall hernia or abnormality. No abdominopelvic ascites. Musculoskeletal: No acute or significant osseous findings. IMPRESSION: No acute abnormality noted. No significant interval change from the prior study is noted. Electronically Signed   By: Alcide Clever M.D.   On: 08/18/2023 23:40   CT Head Wo Contrast Result Date: 08/18/2023 CLINICAL DATA:  Polytrauma, blunt c/o seizures. Pt reports she has been having a few seizures since TBI, usually is triggered by stress EXAM: CT HEAD WITHOUT CONTRAST TECHNIQUE: Contiguous axial images were obtained from the base of the skull through the vertex without intravenous contrast. RADIATION DOSE REDUCTION: This exam was performed according to the departmental dose-optimization program which includes automated exposure control, adjustment of the mA and/or kV according to patient size and/or use of iterative reconstruction technique. COMPARISON:  CT head 07/16/2023 FINDINGS: Brain: No evidence of large-territorial acute infarction. No parenchymal hemorrhage. No mass lesion. No extra-axial collection. No mass effect or midline shift. No hydrocephalus. Basilar cisterns are patent. Vascular: No hyperdense vessel. Skull: No acute fracture or focal lesion. Sinuses/Orbits: Left maxillary sinus mucosal thickening. Otherwise paranasal sinuses and mastoid  air cells are clear. The orbits are unremarkable. Other: None. IMPRESSION: No acute intracranial abnormality. Electronically Signed    By: Tish Frederickson M.D.   On: 08/18/2023 19:44    Pending Labs Unresulted Labs (From admission, onward)     Start     Ordered   08/21/23 0500  CBC  Tomorrow morning,   R        08/20/23 1144   08/21/23 0500  Basic metabolic panel  Tomorrow morning,   R        08/20/23 1144   08/19/23 1258  HIV Antibody (routine testing w rflx)  (HIV Antibody (Routine testing w reflex) panel)  Once,   R        08/19/23 1302            Vitals/Pain Today's Vitals   08/20/23 0615 08/20/23 0637 08/20/23 0700 08/20/23 1021  BP: 116/83  (!) 109/55 (!) 141/83  Pulse: 75  (!) 59 (!) 52  Resp: (!) 25  19 15   Temp:  99.3 F (37.4 C)  97.9 F (36.6 C)  TempSrc:  Temporal  Axillary  SpO2: 96%  96% 96%  Weight:      Height:      PainSc:        Isolation Precautions No active isolations  Medications Medications  sertraline (ZOLOFT) tablet 200 mg (200 mg Oral Given 08/19/23 2118)  gabapentin (NEURONTIN) capsule 400 mg (400 mg Oral Given 08/20/23 1227)  lamoTRIgine (LAMICTAL) tablet 100 mg (100 mg Oral Given 08/20/23 1227)  topiramate (TOPAMAX) tablet 100 mg (100 mg Oral Given 08/19/23 2117)  clonazePAM (KLONOPIN) tablet 0.5-1 mg (has no administration in time range)  enoxaparin (LOVENOX) injection 40 mg (40 mg Subcutaneous Given 08/19/23 2118)  hydrOXYzine (ATARAX) tablet 10 mg (10 mg Oral Given 08/20/23 0438)  oxyCODONE (Oxy IR/ROXICODONE) immediate release tablet 10 mg (has no administration in time range)  LORazepam (ATIVAN) injection 1 mg (1 mg Intravenous Not Given 08/20/23 0951)  acetaminophen (TYLENOL) tablet 650 mg (650 mg Oral Given 08/20/23 1227)  midazolam (VERSED) injection 2 mg (2 mg Intravenous Given 08/19/23 1039)  droperidol (INAPSINE) 2.5 MG/ML injection 1.25 mg (1.25 mg Intravenous Given 08/19/23 1046)  diphenhydrAMINE (BENADRYL) injection 25 mg (25 mg Intravenous Given 08/19/23 1042)  clonazePAM (KLONOPIN) tablet 0.5 mg (0.5 mg Oral Given 08/19/23 1937)  gadobutrol  (GADAVIST) 1 MMOL/ML injection 7.6 mL (7.6 mLs Intravenous Contrast Given 08/19/23 1345)  oxyCODONE (Oxy IR/ROXICODONE) immediate release tablet 5 mg (5 mg Oral Given 08/19/23 1937)  ondansetron (ZOFRAN-ODT) disintegrating tablet 4 mg (4 mg Oral Given 08/19/23 1828)  clonazePAM (KLONOPIN) tablet 0.5 mg (0.5 mg Oral Given 08/19/23 2118)  oxyCODONE (Oxy IR/ROXICODONE) immediate release tablet 5 mg (5 mg Oral Given 08/19/23 2214)  ketorolac (TORADOL) 30 MG/ML injection 30 mg (30 mg Intravenous Given 08/19/23 2355)  oxyCODONE (Oxy IR/ROXICODONE) immediate release tablet 5 mg (5 mg Oral Given 08/20/23 0439)  LORazepam (ATIVAN) injection 2 mg (2 mg Intravenous Given 08/20/23 0532)    Mobility walks     Focused Assessments     R Recommendations: See Admitting Provider Note  Report given to:   Additional Notes:

## 2023-08-20 NOTE — ED Notes (Signed)
MD at bedside. 

## 2023-08-20 NOTE — ED Notes (Signed)
Patient had pseudoseizure like episode 2 minutes in duration, no elevation in HR, fam med and neurology aware.

## 2023-08-20 NOTE — ED Notes (Signed)
Pt noted to be resting at present, zero noted seizing

## 2023-08-20 NOTE — Plan of Care (Signed)
FMTS Interim Progress Note  Paged by RN that patient was requesting to leave AMA.  Went to bedside.  Patient was experiencing an episode of shaking and husband was present supporting her.  Episode lasted about 2 additional minutes after entering the room and resolved spontaneously.  Patient conversant afterwards.  Patient expressed extreme frustration and not receiving Ativan and IV pain medication.  States she has Ativan and oxycodone at home and came here for answers.  Discussed neurology and psychiatry recommendations, patient strongly disagreed with diagnosis and states she will "walk to [facility]".  Reported she had multiple instances of "ridicule" by nursing and other staff.  Expressed frustration and not being treated with more respect.  Provided emphatic listening.  Patient ultimately decided to leave AMA, RN provided paperwork and patient removed the IV herself.  Elberta Fortis, MD 08/20/2023, 7:00 PM PGY-2, [facility] Family Medicine Service pager [phone]

## 2023-08-20 NOTE — Discharge Instructions (Signed)
[facility] [address], [place], [address] [phone] phone OUTPATIENT Walk-in information: Please note, all walk-ins are first come & first serve, with limited number of available spots. Therapist for therapy:  Monday (female) & Wednesdays-Thursdays (female): Please ARRIVE at 7:00 AM for registration Will START at 8:00 AM Every 1st & 2nd Friday of the month: Please ARRIVE at 7:00 AM for registration Will START at 1 PM - 5 PM (virtual) Psychiatrist for medication management: Monday - Friday:  Please ARRIVE at 7:00 AM for registration Will START at 8:00 AM   Regretfully, due to limited availability, please be aware that you may not been seen on the same day as walk-in. Please consider making an appointment or trying again. Thank you for your patience and understanding.

## 2023-08-20 NOTE — ED Notes (Signed)
Patient experiencing pseudoseizure activity 3 minutes in duration. MD Derry Lory notified and aware. Verbal order taken by this RN, 1mg  ativan IV to give now.

## 2023-08-20 NOTE — Progress Notes (Signed)
[facility] admission provider paged to notify of arrival to [facility] room 11.

## 2023-08-20 NOTE — ED Notes (Signed)
MD Miquel Dunn and MD Mliss Sax at bedside

## 2023-11-02 ENCOUNTER — Encounter (HOSPITAL_BASED_OUTPATIENT_CLINIC_OR_DEPARTMENT_OTHER): Payer: Self-pay | Admitting: Emergency Medicine

## 2023-11-02 ENCOUNTER — Emergency Department (HOSPITAL_BASED_OUTPATIENT_CLINIC_OR_DEPARTMENT_OTHER)
Admission: EM | Admit: 2023-11-02 | Discharge: 2023-11-02 | Disposition: A | Discharge status: Home / Self Care | Payer: Self-pay | Attending: Emergency Medicine | Admitting: Emergency Medicine

## 2023-11-02 DIAGNOSIS — H16133 Photokeratitis, bilateral: Secondary | ICD-10-CM | POA: Insufficient documentation

## 2023-11-02 DIAGNOSIS — Z79899 Other long term (current) drug therapy: Secondary | ICD-10-CM | POA: Insufficient documentation

## 2023-11-02 MED ORDER — ERYTHROMYCIN 5 MG/GM OP OINT
TOPICAL_OINTMENT | OPHTHALMIC | 0 refills | Status: AC
Start: 2023-11-02 — End: ?

## 2023-11-02 MED ORDER — TETRACAINE HCL 0.5 % OP SOLN
1.0000 [drp] | Freq: Once | OPHTHALMIC | Status: AC
  Administered 2023-11-02: 1 [drp] via OPHTHALMIC
  Filled 2023-11-02: qty 4

## 2023-11-02 MED ORDER — FLUORESCEIN SODIUM 1 MG OP STRP
1.0000 | ORAL_STRIP | Freq: Once | OPHTHALMIC | Status: AC
  Administered 2023-11-02: 1 via OPHTHALMIC
  Filled 2023-11-02: qty 1

## 2023-11-02 NOTE — ED Triage Notes (Signed)
 Patient reports she cannot see in bright lights, but is able to see in dim light. Sx started yesterday. Patient reports she recently built a "black light room" in her home and has been spending a lot of time in there.   Pupil size 4-5 mm bilaterally.

## 2023-11-02 NOTE — ED Provider Notes (Addendum)
 [place] [facility] Provider Note   CSN: [id number] Arrival date & time: 11/02/23  1601     History  Chief Complaint  Patient presents with   Eye Problem    [name] is a 33 y.o. female.  Patient has been building a black light room and is not pending a lot of time in there.  Starting yesterday she started to get some visual changes and discomfort in her eyes and light sensitivity.  Patient in the past has worn contacts but has not had any in for about a year.  The eyes do not itch.  But they do burn.  Patient is never had anything happen like this in the past.  Not associate with headache she does have a history of migraines and does have a history of some atypical type seizure activity that sometimes it takes her vision.  But this is not anything like that.  Past medical history significant for psychogenic nonepileptic seizures generalized anxiety disorder with panic attacks major depressive disorder.  Posttraumatic stress disorder status post postgastrectomy malabsorption dumping syndrome.       Home Medications Prior to Admission medications   Medication Sig Start Date End Date Taking? Authorizing Provider  erythromycin ophthalmic ointment Place a 1/2 inch ribbon of ointment into the lower eyelid of both eyes 3 times a day. 11/02/23  Yes Vanetta Mulders, MD  amphetamine-dextroamphetamine (ADDERALL XR) 25 MG 24 hr capsule Take 25 mg by mouth every morning.    [provider]  amphetamine-dextroamphetamine (ADDERALL) 20 MG tablet Take 20 mg by mouth daily. 10/04/22   [provider]  Ascorbic Acid (VITAMIN C) 1000 MG tablet Take 1,000 mg by mouth daily.    [provider]  baclofen (LIORESAL) 20 MG tablet Take 1 tablet by mouth at bedtime. 06/27/19   [provider]  Biotin 1000 MCG CHEW Chew 1 tablet by mouth daily.    [provider]  Calcium Citrate-Vitamin D 250-200 MG-UNIT TABS Take 1  tablet by mouth daily.    [provider]  clonazePAM (KLONOPIN) 1 MG tablet Take 1 mg by mouth at bedtime. 12/12/21   [provider]  etonogestrel-ethinyl estradiol (NUVARING) 0.12-0.015 MG/24HR vaginal ring Place 1 each vaginally every 28 (twenty-eight) days. Insert vaginally and leave in place for 3 consecutive weeks, then remove for 1 week.    [provider]  gabapentin (NEURONTIN) 300 MG capsule Take 1 capsule (300 mg total) by mouth 3 (three) times daily for 5 days. Patient taking differently: Take 600 mg by mouth at bedtime. 10/15/20 08/19/23  Tonia Brooms, MD  gabapentin (NEURONTIN) 400 MG capsule Take 400 mg by mouth 3 (three) times daily.    [provider]  lamoTRIgine (LAMICTAL) 25 MG tablet Take 75 mg by mouth daily.    [provider]  methocarbamol (ROBAXIN) 750 MG tablet Take 750 mg by mouth 4 (four) times daily.    [provider]  mineral oil enema Place 1 enema rectally once.    [provider]  Multiple Vitamin (MULTIVITAMIN WITH MINERALS) TABS tablet Take 1 tablet by mouth daily.    [provider]  omeprazole (PRILOSEC) 20 MG capsule Take 1 capsule (20 mg total) by mouth daily. Patient not taking: Reported on 08/19/2023 09/03/22 08/19/23  Maia Plan, MD  ondansetron (ZOFRAN) 8 MG tablet Take 8 mg by mouth every 8 (eight) hours as needed for nausea or vomiting.    [provider]  oxyCODONE-acetaminophen (PERCOCET) 10-325 MG tablet Take 1 tablet by mouth 2 (two) times daily.    [provider]  rizatriptan (MAXALT-MLT) 10 MG disintegrating tablet Take 10 mg by mouth as needed for migraine. May repeat in 2 hours if needed    [provider]  sertraline (ZOLOFT) 100 MG tablet Take 200 mg by mouth at bedtime.    [provider]  topiramate (TOPAMAX) 100 MG tablet Take 100 mg by mouth at bedtime. 08/22/22   [provider]  vitamin B-12 (CYANOCOBALAMIN) 100 MCG  tablet Take 100 mcg by mouth daily.    [provider]  albuterol (VENTOLIN HFA) 108 (90 Base) MCG/ACT inhaler Inhale 2 puffs into the lungs every 4 (four) hours as needed for wheezing or shortness of breath. Patient not taking: Reported on 11/02/2020 04/28/20 11/09/20  Horton, Mayer Masker, MD  levalbuterol [facility] HFA) 45 MCG/ACT inhaler Inhale 1-2 puffs into the lungs every 6 (six) hours as needed for wheezing or shortness of breath. Patient not taking: Reported on 11/02/2020  11/09/20  [provider]  potassium chloride (K-DUR) 10 MEQ tablet Take 1 tablet (10 mEq total) by mouth daily. Patient not taking: Reported on 08/16/2015 04/27/15 08/16/15  Alison Murray, MD  potassium chloride SA (KLOR-CON) 20 MEQ tablet Take 1 tablet (20 mEq total) by mouth 2 (two) times daily. Patient not taking: Reported on 11/02/2020 04/28/20 11/09/20  Shon Baton, MD      Allergies    Bee venom, Iodine, Tetanus-diphth-acell pertussis, Aripiprazole, Prazosin, Vaccinium angustifolium, and Morphine and codeine    Review of Systems   Review of Systems  Constitutional:  Negative for chills and fever.  HENT:  Negative for ear pain and sore throat.   Eyes:  Positive for photophobia, pain and visual disturbance.  Respiratory:  Negative for cough and shortness of breath.   Cardiovascular:  Negative for chest pain and palpitations.  Gastrointestinal:  Negative for abdominal pain and vomiting.  Genitourinary:  Negative for dysuria and hematuria.  Musculoskeletal:  Negative for arthralgias and back pain.  Skin:  Negative for color change and rash.  Neurological:  Negative for seizures and syncope.  All other systems reviewed and are negative.   Physical Exam Updated Vital Signs BP (!) 138/97   Pulse 64   Temp 98.1 F (36.7 C)   Resp 10   Ht 1.753 m (5\' 9" )   Wt 72.6 kg   LMP 10/15/2023 (Approximate)   SpO2 100%   BMI 23.63 kg/m  Physical Exam Vitals and nursing note reviewed.   Constitutional:      General: She is not in acute distress.    Appearance: Normal appearance. She is well-developed.  HENT:     Head: Normocephalic and atraumatic.  Eyes:     Extraocular Movements: Extraocular movements intact.     Conjunctiva/sclera: Conjunctivae normal.     Pupils: Pupils are equal, round, and reactive to light.     Comments: Anterior chambers.  Normal.  Pupils are equal and reactive.  Extract muscles intact.  Sclera clear.  Fluorescein staining shows significant corneal uptake.  Consistent with ultraviolet exposure keratitis.  Cardiovascular:     Rate and Rhythm: Normal rate and regular rhythm.     Heart sounds: No murmur heard. Pulmonary:     Effort: Pulmonary effort is normal. No respiratory distress.     Breath sounds: Normal breath sounds.  Abdominal:     Palpations: Abdomen is soft.     Tenderness:  There is no abdominal tenderness.  Musculoskeletal:        General: No swelling.     Cervical back: Neck supple.  Skin:    General: Skin is warm and dry.     Capillary Refill: Capillary refill takes less than 2 seconds.  Neurological:     General: No focal deficit present.     Mental Status: She is alert and oriented to person, place, and time.  Psychiatric:        Mood and Affect: Mood normal.     ED Results / Procedures / Treatments   Labs (all labs ordered are listed, but only abnormal results are displayed) Labs Reviewed  CBG MONITORING, ED    EKG EKG Interpretation Date/Time:  Friday November 02 2023 20:28:41 EDT Ventricular Rate:  60 PR Interval:  139 QRS Duration:  96 QT Interval:  421 QTC Calculation: 421 R Axis:   72  Text Interpretation: Sinus rhythm Confirmed by Vanetta Mulders [id number]) on 11/02/2023 8:33:19 PM  Radiology No results found.  Procedures Procedures    Medications Ordered in ED Medications  fluorescein ophthalmic strip 1 strip (1 strip Both Eyes Given 11/02/23 2022)  tetracaine (PONTOCAINE) 0.5 % ophthalmic solution  1 drop (1 drop Both Eyes Given 11/02/23 2023)    ED Course/ Medical Decision Making/ A&P                                 Medical Decision Making Risk Prescription drug management.  Eye exam consistent with ultraviolet keratitis.  Turn to black lights off in your room.  Use the antibiotic ointment.  Follow-up with ophthalmology.  Final Clinical Impression(s) / ED Diagnoses Final diagnoses:  UV keratitis, bilateral    Rx / DC Orders ED Discharge Orders          Ordered    erythromycin ophthalmic ointment        11/02/23 2056              Vanetta Mulders, MD 11/02/23 2022    Vanetta Mulders, MD 11/02/23 2057

## 2023-11-02 NOTE — Discharge Instructions (Addendum)
 Use the antibiotic ointment thin ribbon 3 times a day both eyes.  Continue your pain medication.  Follow-up with ophthalmology give them a call on Monday let him know you were seen here on Friday they will work you in for follow-up.  Return for any new or worse symptoms.  Make sure you turn the black lights off in your room.  Avoid any exposure to your eyes from black lights.  Also wear sunglasses when outside.

## 2023-11-02 NOTE — ED Notes (Signed)
 MD Deretha Emory made aware of patients presentation

## 2024-03-30 ENCOUNTER — Emergency Department (HOSPITAL_COMMUNITY)

## 2024-03-30 ENCOUNTER — Emergency Department (HOSPITAL_COMMUNITY)
Admission: EM | Admit: 2024-03-30 | Discharge: 2024-03-30 | Disposition: A | Discharge status: Home / Self Care | Attending: Emergency Medicine | Admitting: Emergency Medicine

## 2024-03-30 ENCOUNTER — Encounter (HOSPITAL_COMMUNITY): Payer: Self-pay | Admitting: Emergency Medicine

## 2024-03-30 ENCOUNTER — Other Ambulatory Visit: Payer: Self-pay

## 2024-03-30 DIAGNOSIS — S0990XA Unspecified injury of head, initial encounter: Secondary | ICD-10-CM | POA: Insufficient documentation

## 2024-03-30 DIAGNOSIS — Y92831 Amusement park as the place of occurrence of the external cause: Secondary | ICD-10-CM | POA: Insufficient documentation

## 2024-03-30 DIAGNOSIS — Z7951 Long term (current) use of inhaled steroids: Secondary | ICD-10-CM | POA: Diagnosis not present

## 2024-03-30 DIAGNOSIS — G40909 Epilepsy, unspecified, not intractable, without status epilepticus: Secondary | ICD-10-CM | POA: Insufficient documentation

## 2024-03-30 DIAGNOSIS — R569 Unspecified convulsions: Secondary | ICD-10-CM

## 2024-03-30 DIAGNOSIS — W16312A Fall into other water striking water surface causing other injury, initial encounter: Secondary | ICD-10-CM | POA: Diagnosis not present

## 2024-03-30 DIAGNOSIS — J45909 Unspecified asthma, uncomplicated: Secondary | ICD-10-CM | POA: Insufficient documentation

## 2024-03-30 DIAGNOSIS — I10 Essential (primary) hypertension: Secondary | ICD-10-CM | POA: Insufficient documentation

## 2024-03-30 DIAGNOSIS — Z87891 Personal history of nicotine dependence: Secondary | ICD-10-CM | POA: Insufficient documentation

## 2024-03-30 LAB — CBC WITH DIFFERENTIAL/PLATELET
Abs Immature Granulocytes: 0.01 K/uL (ref 0.00–0.07)
Basophils Absolute: 0.1 K/uL (ref 0.0–0.1)
Basophils Relative: 1 %
Eosinophils Absolute: 0.1 K/uL (ref 0.0–0.5)
Eosinophils Relative: 3 %
HCT: 36.7 % (ref 36.0–46.0)
Hemoglobin: 11.4 g/dL — ABNORMAL LOW (ref 12.0–15.0)
Immature Granulocytes: 0 %
Lymphocytes Relative: 50 %
Lymphs Abs: 2.4 K/uL (ref 0.7–4.0)
MCH: 26.6 pg (ref 26.0–34.0)
MCHC: 31.1 g/dL (ref 30.0–36.0)
MCV: 85.5 fL (ref 80.0–100.0)
Monocytes Absolute: 0.5 K/uL (ref 0.1–1.0)
Monocytes Relative: 10 %
Neutro Abs: 1.7 K/uL (ref 1.7–7.7)
Neutrophils Relative %: 36 %
Platelets: 182 K/uL (ref 150–400)
RBC: 4.29 MIL/uL (ref 3.87–5.11)
RDW: 13.1 % (ref 11.5–15.5)
WBC: 4.8 K/uL (ref 4.0–10.5)
nRBC: 0 % (ref 0.0–0.2)

## 2024-03-30 LAB — COMPREHENSIVE METABOLIC PANEL WITH GFR
ALT: 10 U/L (ref 0–44)
AST: 15 U/L (ref 15–41)
Albumin: 3.3 g/dL — ABNORMAL LOW (ref 3.5–5.0)
Alkaline Phosphatase: 71 U/L (ref 38–126)
Anion gap: 5 (ref 5–15)
BUN: 20 mg/dL (ref 6–20)
CO2: 22 mmol/L (ref 22–32)
Calcium: 8.6 mg/dL — ABNORMAL LOW (ref 8.9–10.3)
Chloride: 112 mmol/L — ABNORMAL HIGH (ref 98–111)
Creatinine, Ser: 0.77 mg/dL (ref 0.44–1.00)
GFR, Estimated: >60 mL/min (ref >60)
Glucose, Bld: 88 mg/dL (ref 70–99)
Potassium: 4.1 mmol/L (ref 3.5–5.1)
Sodium: 139 mmol/L (ref 135–145)
Total Bilirubin: 0.3 mg/dL (ref 0.0–1.2)
Total Protein: 6.5 g/dL (ref 6.5–8.1)

## 2024-03-30 LAB — MAGNESIUM: Magnesium: 2.3 mg/dL (ref 1.7–2.4)

## 2024-03-30 LAB — HCG, SERUM, QUALITATIVE: Preg, Serum: NEGATIVE

## 2024-03-30 MED ORDER — LORAZEPAM 2 MG/ML IJ SOLN
INTRAMUSCULAR | Status: AC
  Filled 2024-03-30: qty 1

## 2024-03-30 MED ORDER — LORAZEPAM 2 MG/ML IJ SOLN
2.0000 mg | Freq: Once | INTRAMUSCULAR | Status: AC
  Administered 2024-03-30: 2 mg via INTRAVENOUS

## 2024-03-30 NOTE — ED Provider Notes (Signed)
 [place] EMERGENCY DEPARTMENT AT [facility] Provider Note  CSN: [id number] Arrival date & time: 03/30/24 1756  Chief Complaint(s) Seizures  HPI [name] is a 33 y.o. female with nonepileptic seizures presenting with possible seizure.  [place] and bumped her head.  She reports that she got in the pool and then got out and started to feel like she was going to have a seizure and then think she had a seizure.  Paramedics report that she was having shaking activity when they arrived and they gave her 10 mg of Versed  however the patient suddenly stopped seizing and seemed normal and was able to answer questions appropriately.  The patient told the paramedics that Versed  does not work on me.  Patient then arrived in the ER and had some nonspecific shaking which stopped patient was spontaneously awake and asked answering questions.  Patient reports that these episodes are consistent with prior seizure-like activity.  She denies any fevers or chills.  No nausea or vomiting.  No other new symptoms.   Past Medical History Past Medical History:  Diagnosis Date   Animal bite of finger    a cat   Anxiety    Asthma    Bronchitis    Endometriosis    Insomnia    PCOS (polycystic ovarian syndrome)    Postgastrectomy malabsorption    PTSD (post-traumatic stress disorder)    Sleep apnea    Sleep disorder    Patient Active Problem List   Diagnosis Date Noted   Seizure-like activity (HCC) 08/19/2023   Abdominal pain 08/19/2023   Hypoglycemia 08/19/2023   Moderate recurrent major depression (HCC) 03/12/2023   GAD (generalized anxiety disorder) 05/19/2020   Asthma exacerbation 04/27/2015   Thrombocytopenia (HCC)    Shortness of breath    Essential hypertension    Asthma    Hypokalemia    Status post cesarean section 01/20/2014   Pregnancy 01/18/2014   Allergic rhinitis, seasonal 12/04/2013   Tobacco use  disorder affecting pregnancy, antepartum 07/11/2013   Rh negative state in antepartum period 06/13/2013   Obesity 06/11/2013   Smoker 06/11/2013   Home Medication(s) Prior to Admission medications   Medication Sig Start Date End Date Taking? Authorizing Provider  amphetamine-dextroamphetamine (ADDERALL XR) 25 MG 24 hr capsule Take 25 mg by mouth every morning.    [provider]  amphetamine-dextroamphetamine (ADDERALL) 20 MG tablet Take 20 mg by mouth daily. 10/04/22   [provider]  Ascorbic Acid (VITAMIN C) 1000 MG tablet Take 1,000 mg by mouth daily.    [provider]  baclofen  (LIORESAL ) 20 MG tablet Take 1 tablet by mouth at bedtime. 06/27/19   [provider]  Biotin 1000 MCG CHEW Chew 1 tablet by mouth daily.    [provider]  Calcium  Citrate-Vitamin D 250-200 MG-UNIT TABS Take 1 tablet by mouth daily.    [provider]  clonazePAM  (KLONOPIN ) 1 MG tablet Take 1 mg by mouth at bedtime. 12/12/21   [provider]  erythromycin  ophthalmic ointment Place a 1/2 inch ribbon of ointment into the lower eyelid of both eyes 3 times a day. 11/02/23   Zackowski, Scott, MD  etonogestrel-ethinyl estradiol (NUVARING) 0.12-0.015 MG/24HR vaginal ring Place 1 each vaginally every 28 (twenty-eight) days. Insert vaginally and leave in place for 3 consecutive weeks, then remove for 1 week.    [provider]  gabapentin  (NEURONTIN ) 300 MG  capsule Take 1 capsule (300 mg total) by mouth 3 (three) times daily for 5 days. Patient taking differently: Take 600 mg by mouth at bedtime. 10/15/20 08/19/23  Francis Gee, MD  gabapentin  (NEURONTIN ) 400 MG capsule Take 400 mg by mouth 3 (three) times daily.    [provider]  lamoTRIgine  (LAMICTAL ) 25 MG tablet Take 75 mg by mouth daily.    [provider]  methocarbamol (ROBAXIN) 750 MG tablet Take 750 mg by mouth 4 (four) times daily.    [provider]  mineral oil  enema Place 1 enema rectally once.    [provider]  Multiple Vitamin (MULTIVITAMIN WITH MINERALS) TABS tablet Take 1 tablet by mouth daily.    [provider]  omeprazole  (PRILOSEC) 20 MG capsule Take 1 capsule (20 mg total) by mouth daily. Patient not taking: Reported on 08/19/2023 09/03/22 08/19/23  Darra Fonda MATSU, MD  ondansetron  (ZOFRAN ) 8 MG tablet Take 8 mg by mouth every 8 (eight) hours as needed for nausea or vomiting.    [provider]  oxyCODONE -acetaminophen  (PERCOCET) 10-325 MG tablet Take 1 tablet by mouth 2 (two) times daily.    [provider]  rizatriptan (MAXALT-MLT) 10 MG disintegrating tablet Take 10 mg by mouth as needed for migraine. May repeat in 2 hours if needed    [provider]  sertraline  (ZOLOFT ) 100 MG tablet Take 200 mg by mouth at bedtime.    [provider]  topiramate  (TOPAMAX ) 100 MG tablet Take 100 mg by mouth at bedtime. 08/22/22   [provider]  vitamin B-12 (CYANOCOBALAMIN ) 100 MCG tablet Take 100 mcg by mouth daily.    [provider]  albuterol  (VENTOLIN  HFA) 108 (90 Base) MCG/ACT inhaler Inhale 2 puffs into the lungs every 4 (four) hours as needed for wheezing or shortness of breath. Patient not taking: Reported on 11/02/2020 04/28/20 11/09/20  Horton, Charmaine FALCON, MD  levalbuterol  (XOPENEX  HFA) 45 MCG/ACT inhaler Inhale 1-2 puffs into the lungs every 6 (six) hours as needed for wheezing or shortness of breath. Patient not taking: Reported on 11/02/2020  11/09/20  [provider]  potassium chloride  (K-DUR) 10 MEQ tablet Take 1 tablet (10 mEq total) by mouth daily. Patient not taking: Reported on 08/16/2015 04/27/15 08/16/15  Levern Beckey HERO, MD  potassium chloride  SA (KLOR-CON ) 20 MEQ tablet Take 1 tablet (20 mEq total) by mouth 2 (two) times daily. Patient not taking: Reported on 11/02/2020 04/28/20 11/09/20  Bari Charmaine FALCON, MD                                                                                                                                     Past Surgical History Past Surgical History:  Procedure Laterality Date   CESAREAN SECTION N/A 01/20/2014   Procedure: CESAREAN SECTION;  Surgeon: Carlin DELENA Centers, MD;  Location: [facility];  Service: Obstetrics;  Laterality: N/A;  GASTRIC BYPASS     MOUTH SURGERY     WISDOM TOOTH EXTRACTION     Family History Family History  Problem Relation Age of Onset   Asthma Mother    Diabetes Father    Heart attack Maternal Grandfather    Diabetes Paternal Grandmother    Heart attack Paternal Grandfather     Social History Social History   Tobacco Use   Smoking status: Former    Current packs/day: 0.00    Types: Cigarettes    Quit date: 09/22/2019    Years since quitting: 4.5   Smokeless tobacco: Never  Substance Use Topics   Alcohol  use: Not Currently   Drug use: No   Allergies Bee venom, Iodine, Tetanus-diphth-acell pertussis, Aripiprazole, Prazosin, Vaccinium angustifolium, and Morphine  and codeine  Review of Systems Review of Systems  All other systems reviewed and are negative.   Physical Exam Vital Signs  I have reviewed the triage vital signs BP 108/67   Pulse 73   Resp 20   Ht 5' 9 (1.753 m)   Wt 72.6 kg   SpO2 100%   BMI 23.63 kg/m  Physical Exam Vitals and nursing note reviewed.  Constitutional:      General: She is not in acute distress.    Appearance: She is well-developed.  HENT:     Head: Normocephalic and atraumatic.     Mouth/Throat:     Mouth: Mucous membranes are moist.  Eyes:     Pupils: Pupils are equal, round, and reactive to light.  Cardiovascular:     Rate and Rhythm: Normal rate and regular rhythm.     Heart sounds: No murmur heard. Pulmonary:     Effort: Pulmonary effort is normal. No respiratory distress.     Breath sounds: Normal breath sounds.  Abdominal:     General: Abdomen is flat.     Palpations: Abdomen is soft.     Tenderness: There is no abdominal  tenderness.  Musculoskeletal:        General: No tenderness.     Right lower leg: No edema.     Left lower leg: No edema.  Skin:    General: Skin is warm and dry.  Neurological:     General: No focal deficit present.     Mental Status: She is alert. Mental status is at baseline.  Psychiatric:        Mood and Affect: Mood normal.        Behavior: Behavior normal.     ED Results and Treatments Labs (all labs ordered are listed, but only abnormal results are displayed) Labs Reviewed  COMPREHENSIVE METABOLIC PANEL WITH GFR - Abnormal; Notable for the following components:      Result Value   Chloride 112 (*)    Calcium  8.6 (*)    Albumin 3.3 (*)    All other components within normal limits  CBC WITH DIFFERENTIAL/PLATELET - Abnormal; Notable for the following components:   Hemoglobin 11.4 (*)    All other components within normal limits  MAGNESIUM   HCG, SERUM, QUALITATIVE  Radiology CT Head Wo Contrast Result Date: 03/30/2024 CLINICAL DATA:  Head trauma, abnormal mental status (Age 85-64y); Neck trauma, impaired ROM (Age 30-64y). Head injury, seizure EXAM: CT HEAD WITHOUT CONTRAST CT CERVICAL SPINE WITHOUT CONTRAST TECHNIQUE: Multidetector CT imaging of the head and cervical spine was performed following the standard protocol without intravenous contrast. Multiplanar CT image reconstructions of the cervical spine were also generated. RADIATION DOSE REDUCTION: This exam was performed according to the departmental dose-optimization program which includes automated exposure control, adjustment of the mA and/or kV according to patient size and/or use of iterative reconstruction technique. COMPARISON:  None Available. FINDINGS: CT HEAD FINDINGS Brain: No evidence of acute infarction, hemorrhage, hydrocephalus, extra-axial collection or mass lesion/mass effect. Vascular: No  hyperdense vessel or unexpected calcification. Skull: Normal. Negative for fracture or focal lesion. Sinuses/Orbits: No acute finding. Other: Mastoid air cells and middle ear cavities are clear. CT CERVICAL SPINE FINDINGS Alignment: Normal. Skull base and vertebrae: No acute fracture. No primary bone lesion or focal pathologic process. Soft tissues and spinal canal: No prevertebral fluid or swelling. No visible canal hematoma. Disc levels: Intervertebral disc heights are preserved. Prevertebral soft tissues are not thickened on sagittal reformats. Spinal canal is widely patent. No significant neuroforaminal narrowing. Upper chest: Negative. Other: None IMPRESSION: 1. No acute intracranial abnormality. No calvarial fracture. 2. No acute fracture or listhesis of the cervical spine. Electronically Signed   By: Dorethia Molt M.D.   On: 03/30/2024 20:05   CT Cervical Spine Wo Contrast Result Date: 03/30/2024 CLINICAL DATA:  Head trauma, abnormal mental status (Age 51-64y); Neck trauma, impaired ROM (Age 74-64y). Head injury, seizure EXAM: CT HEAD WITHOUT CONTRAST CT CERVICAL SPINE WITHOUT CONTRAST TECHNIQUE: Multidetector CT imaging of the head and cervical spine was performed following the standard protocol without intravenous contrast. Multiplanar CT image reconstructions of the cervical spine were also generated. RADIATION DOSE REDUCTION: This exam was performed according to the departmental dose-optimization program which includes automated exposure control, adjustment of the mA and/or kV according to patient size and/or use of iterative reconstruction technique. COMPARISON:  None Available. FINDINGS: CT HEAD FINDINGS Brain: No evidence of acute infarction, hemorrhage, hydrocephalus, extra-axial collection or mass lesion/mass effect. Vascular: No hyperdense vessel or unexpected calcification. Skull: Normal. Negative for fracture or focal lesion. Sinuses/Orbits: No acute finding. Other: Mastoid air cells and  middle ear cavities are clear. CT CERVICAL SPINE FINDINGS Alignment: Normal. Skull base and vertebrae: No acute fracture. No primary bone lesion or focal pathologic process. Soft tissues and spinal canal: No prevertebral fluid or swelling. No visible canal hematoma. Disc levels: Intervertebral disc heights are preserved. Prevertebral soft tissues are not thickened on sagittal reformats. Spinal canal is widely patent. No significant neuroforaminal narrowing. Upper chest: Negative. Other: None IMPRESSION: 1. No acute intracranial abnormality. No calvarial fracture. 2. No acute fracture or listhesis of the cervical spine. Electronically Signed   By: Dorethia Molt M.D.   On: 03/30/2024 20:05   DG Chest Portable 1 View Result Date: 03/30/2024 CLINICAL DATA:  Shortness of breath. Hit head on water slide and had seizure. History of traumatic brain injury and seizures. EXAM: PORTABLE CHEST 1 VIEW COMPARISON:  10/15/2020. FINDINGS: The heart size and mediastinal contours are within normal limits. No consolidation, effusion, or pneumothorax is seen. No acute osseous abnormality. IMPRESSION: No active disease. Electronically Signed   By: Leita Birmingham M.D.   On: 03/30/2024 18:29    Pertinent labs & imaging results that were available during my care of the patient  were reviewed by me and considered in my medical decision making (see MDM for details).  Medications Ordered in ED Medications  LORazepam  (ATIVAN ) injection 2 mg (2 mg Intravenous Given 03/30/24 1952)                                                                                                                                     Procedures Procedures  (including critical care time)  Medical Decision Making / ED Course   MDM:  33 year old presenting with seizure-like activity.  Patient overall well-appearing, physical examination unremarkable.  No signs of head trauma or laceration.  Given possible head injury CT head was obtained as well  as CT cervical spine.  No evidence of any acute process.  Laboratory testing is overall reassuring.  I do not think the patient is actually having epileptic seizures.  She is not having any tongue biting or incontinence, immediately returns to baseline.  She has had no postictal period.  Reviewing chart she has numerous prior ER visits for nonepileptic type seizures.  She has even been admitted previously and had overnight EEG which showed nonepileptic seizure.  I do not think there is any indication for further workup in the emergency department.  Notably patient did have episode of shaking while in the CT scanner.  Nurse notified other provider who was not familiar with the patient and ordered Ativan .  I do not think that patient was actually having a seizure during this event. I don't think she needs to be observed any additional time in ER.       Additional history obtained: -Additional history obtained from ems and spouse -External records from outside source obtained and reviewed including: Chart review including previous notes, labs, imaging, consultation notes including prior visits    Lab Tests: -I ordered, reviewed, and interpreted labs.   The pertinent results include:   Labs Reviewed  COMPREHENSIVE METABOLIC PANEL WITH GFR - Abnormal; Notable for the following components:      Result Value   Chloride 112 (*)    Calcium  8.6 (*)    Albumin 3.3 (*)    All other components within normal limits  CBC WITH DIFFERENTIAL/PLATELET - Abnormal; Notable for the following components:   Hemoglobin 11.4 (*)    All other components within normal limits  MAGNESIUM   HCG, SERUM, QUALITATIVE    Notable for mild anemia   Imaging Studies ordered: I ordered imaging studies including CT head, CT cervical spine  On my interpretation imaging demonstrates no acute process I independently visualized and interpreted imaging. I agree with the radiologist interpretation   Medicines ordered and  prescription drug management: Meds ordered this encounter  Medications   LORazepam  (ATIVAN ) injection 2 mg   LORazepam  (ATIVAN ) 2 MG/ML injection    Joshua Garnette ORN: cabinet override    -I have reviewed the patients home medicines and have made adjustments as needed  Cardiac Monitoring: The  patient was maintained on a cardiac monitor.  I personally viewed and interpreted the cardiac monitored which showed an underlying rhythm of: NSR  Reevaluation: After the interventions noted above, I reevaluated the patient and found that their symptoms have resolved  Co morbidities that complicate the patient evaluation  Past Medical History:  Diagnosis Date   Animal bite of finger    a cat   Anxiety    Asthma    Bronchitis    Endometriosis    Insomnia    PCOS (polycystic ovarian syndrome)    Postgastrectomy malabsorption    PTSD (post-traumatic stress disorder)    Sleep apnea    Sleep disorder       Dispostion: Disposition decision including need for hospitalization was considered, and patient discharged from emergency department.    Final Clinical Impression(s) / ED Diagnoses Final diagnoses:  Seizure-like activity (HCC)     This chart was dictated using voice recognition software.  Despite best efforts to proofread,  errors can occur which can change the documentation meaning.    Francesca Elsie CROME, MD 03/30/24 [phone]

## 2024-03-30 NOTE — Discharge Instructions (Addendum)
 We evaluated you for your head injury and possible seizure.  Your testing in the emergency department is reassuring including the CT scan of your head.  We feel that it is safe to go home.  Please follow-up with your primary doctor and with neurology as scheduled.  Per [place]  DMV statutes, patients with seizures are not allowed to drive until  they have been seizure-free for six months. Use caution when using heavy equipment or power tools. Avoid working on ladders or at heights. Take showers instead of baths. Ensure the water temperature is not too high on the home water heater. Do not go swimming alone. When caring for infants or small children, sit down when holding, feeding, or changing them to minimize risk of injury to the child in the event you have a seizure.

## 2024-03-30 NOTE — ED Notes (Signed)
 Pt returned from CT.  CT tech states pt is having seizure.  Pt noted to be having slight head shaking, drooling.  EDP notified and 2mg  ativan  administered.

## 2024-03-30 NOTE — ED Notes (Signed)
 Seizure pads placed on bed

## 2024-03-30 NOTE — ED Notes (Signed)
 Patient transported to CT

## 2024-03-30 NOTE — ED Triage Notes (Signed)
 Pt bib EMS for seizures. Pt hit head on water slide was alert and then started seizing. Pt has history of TBI and seizures. Normally has multiple runs of seizures. EMS gave 10 of versed . BP 100/60 HR 56 O2 100% CBG 86

## 2024-04-15 NOTE — ED Triage Notes (Signed)
 Pt arrives with complaint of coughing up blood and feeling like her soft palate is collapsing so she can't breathe.  Pt states when she holds her throat she can talk but when she doesn't then the soft palate collapses and she cannot breathe.  Pt states she is anemic, pancytopenic, and has low feritin levels.  Pt is holding her breath and her throat in triage.  Pt states that something hanging down in the back of her throat and the other thing visible is the uvula. VSS

## 2024-04-15 NOTE — Progress Notes (Signed)
 Case Management Discharge Note        CSN: [id number] DOB: [birth year] Service: Emergency Medicine Location: [place] [facility]  Patient Class: Emergency  DC Disposition: : Home or Self Care  Discharge DC Disposition: : Home or Self Care Discharge Transport: Cab Discharge Transport Agency Chosen: Tollie Crystal  [phone])  Discharge Referrals Case closed, patient/family agree with disposition plan: Yes    Case Management Coordination Status: Coordination Complete    Avis Essex  ED Social Worker MSW, LCSWA

## 2024-04-15 NOTE — ED Notes (Signed)
 RT called for nebulized lidocaine , Afrin and viscous lidocaine  at nurses station, Story MD aware    Jon Vinie Purchase, RN 04/15/24 1306

## 2024-04-15 NOTE — ED Notes (Signed)
 Story MD and Zenaida PA at bedside    [facility], RN 04/15/24 [phone]

## 2024-04-15 NOTE — ED Provider Notes (Signed)
 ------------------------------------------------------------------------------- Attestation signed by Alm Lynwood Curtis, MD at 04/17/2024 11:31 AM Tue 04/15/2024 1:22 PM   I am the attending physician of record for this patient. Shared visit with APP   Pt seen and examined. Please see documentation by Zenaida PA for complete H&P.   I have reviewed the APP's note and agree with the pertinent history and physical exam findings except where specifically noted below. I have personally reviewed all the labs and imaging performed on the patient during this visit. I have reviewed the nursing documentation. I have reviewed past medical history, social history, and current medication list.   89F with episodes of choking, shortness of breath. Reports episodes where it feels as if something obstructs here airway. Symptoms began this morning. She endorses spitting up blood after these episodes as well. I have not witnessed any hematemesis/hemoptysis in the ED. She also endorses abd pain. She was evaluated at an OSH last night for the abd pain, and underwent a lab evaluation and CT a/p. CT did not find any etiology for her pain. Labs were at baseline. She has known Fe-def anemia. She reports being sent to the ED for iron infusion, but that was not done.    On exam, she does have some erythema and edema to the soft palate annd uvula. Posterior pharynx appears normal. No obvious FB seen. Plan endoscopy of upper airway to evaluate for FB or etiology responsible for sensation of airway obstruction. Giving nebulized lidocaine , oxymetazoline and viscous lidocaine .   Endoscopy showed normal anatomy at the base of tongue and supraglottic region. No significant edema, erythema. No exudates appreciated. No FB seen.    Antispasmodics and PPI recommended. Ok for d/c.  I supervised the procedure performed by [facility] PA. I was present for the procedure in its entirety. Please see the procedure note documented by the resident  for specific details. PROCEDURE(S): UPPER AIRWAY EVALUATION VIA ENDOSCOPY  -------------------------------------------------------------------------------   Chief Complaint  Patient presents with   Throat Pain       HPI Patient is a 33 year old female PMH GERD, chronic cervical spine pain, PNES, asthma, hx sleeve gastrectomy, PTSD, who presents to the ED for throat pain and difficulty breathing.  Patient states this morning shortly after she woke up, she experienced a sensation of something stuck in the back of her throat, preventing her from able to breathe.  She states she was able to cough and reverse gag to dislodge the obstruction and was able to breathe again.  States this happened multiple times during the day where she feels like there is a part of my flesh stuck in her airway.  Notably, she was seen and evaluated at [facility] ED yesterday for abdominal pain.  Had full workup including labs and CT scans, ultimately discharged.  She is still endorsing generalized abdominal pain, difficulty with p.o. intake.  Denies worsening pain, fever/chills, hematemesis, abnormal BMs, dysuria, hematuria, vaginal bleeding/discharge.       Patient History Medical History[1] Surgical History[2] Family History[3] Social History[4]    Review of Systems Review of Systems  Constitutional:  Negative for chills and fever.  HENT:  Positive for sore throat and trouble swallowing.   Eyes:  Negative for visual disturbance.  Respiratory:  Positive for choking and shortness of breath. Negative for cough.   Cardiovascular:  Negative for chest pain, palpitations and leg swelling.  Gastrointestinal:  Positive for abdominal pain. Negative for blood in stool, diarrhea, nausea and vomiting.  Genitourinary:  Negative for dysuria, flank pain and  frequency.  Musculoskeletal:  Negative for arthralgias, back pain, myalgias and neck pain.  Skin:  Negative for rash and wound.  Neurological:  Negative for  weakness, light-headedness and headaches.  Psychiatric/Behavioral:  The patient is not nervous/anxious.       Physical Exam ED Triage Vitals [04/15/24 1057]  Temp 98.1 F (36.7 C)  Heart Rate 100  Resp 20  BP (!) 166/94  MAP (mmHg) 109  SpO2 100 %  O2 Device None (Room air)  O2 Flow Rate (L/min)   Weight 68 kg (150 lb)   Physical Exam Vitals and nursing note reviewed.  Constitutional:      General: She is not in acute distress.    Appearance: She is well-developed. She is not ill-appearing.  HENT:     Head: Normocephalic and atraumatic.     Jaw: No trismus or tenderness.     Mouth/Throat:     Lips: Pink.     Mouth: Mucous membranes are moist.     Dentition: Normal dentition.     Tongue: No lesions. Tongue does not deviate from midline.     Palate: No mass and lesions.     Pharynx: Uvula midline. Posterior oropharyngeal erythema present. No pharyngeal swelling or oropharyngeal exudate.     Tonsils: No tonsillar exudate.     Comments: Uvula slightly swollen with some translucence. Patient had apx 10 second episode of apnea where oropharynx was closed, she was minimally able to open her mouth, facial flushing and distress followed by large gasp of air and return to baseline. Eyes:     Extraocular Movements: Extraocular movements intact.     Conjunctiva/sclera: Conjunctivae normal.  Cardiovascular:     Rate and Rhythm: Normal rate and regular rhythm.     Pulses:          Radial pulses are 2+ on the right side and 2+ on the left side.       Dorsalis pedis pulses are 2+ on the right side and 2+ on the left side.       Posterior tibial pulses are 2+ on the right side and 2+ on the left side.     Heart sounds: S1 normal and S2 normal. No murmur heard.    No friction rub. No gallop.  Pulmonary:     Effort: Pulmonary effort is normal. No respiratory distress.     Breath sounds: No wheezing, rhonchi or rales.  Abdominal:     General: There is no distension.     Palpations:  Abdomen is soft.     Tenderness: There is no abdominal tenderness. There is no guarding or rebound.  Musculoskeletal:     Cervical back: Normal range of motion.  Skin:    General: Skin is warm and dry.     Capillary Refill: Capillary refill takes less than 2 seconds.  Neurological:     General: No focal deficit present.     Mental Status: She is alert and oriented to person, place, and time. Mental status is at baseline.        CHA2DS2-VASc Score: N/A  Glasgow Coma Scale Score: 15                  General  Date/Time: 04/15/2024 10:41 AM  Performed by: Arthea Debby Brownie, PA-C Authorized by: Alm Lynwood Curtis, MD   Consent:    Consent obtained:  Verbal   Consent given by:  Patient   Risks, benefits, and alternatives were discussed: yes  Risks discussed:  Bleeding and pain   Alternatives discussed:  No treatment Universal protocol:    Procedure explained and questions answered to patient or proxy's satisfaction: yes     Relevant documents present and verified: yes     Immediately prior to procedure, a time out was called: yes     Patient identity confirmed:  Verbally with patient and arm band Indications:    Indications:  Concern for airway obstruction; laryngospasm; oropharyngeal pathology Sedation:    Sedation type:  None Anesthesia:    Anesthesia method:  Topical application (Nebulized lidocaine ; oxymetazoline in nares) Procedure specific details:     Fiberoptic laryngoscopy was performed to evaluate for airway abnormalities.  Airway was patent, secretions minimal, no swelling of the epiglottitis, no masses, erythema, or abnormalities of the tongue base.  Vocal cords and arytenoids were well-visualized without swelling, foreign body, obstruction, or significant erythema.  No acute findings were observed specifically no airway concerns. Post-procedure details:    Procedure completion:  Tolerated well, no immediate complications                        ED Course &  MDM   Medical Decision Making Problems Addressed: Abdominal pain, unspecified abdominal location: complicated acute illness or injury Throat pain: complicated acute illness or injury  Amount and/or Complexity of Data Reviewed External Data Reviewed: labs, radiology and notes.  Risk OTC drugs. Prescription drug management.  See H&P above. 33 year old female PMH GERD, chronic cervical spine pain, PNES, asthma, hx sleeve gastrectomy, PTSD, who presents to the ED for throat pain and concern for airway obstruction.  Symptom onset earlier this morning.  She arrived to the ED hemodynamically stable, afebrile.  No acute distress.  Initially sleeping in bed upon my initial exam.  Her airway was largely patent, had mild translucence of the uvula and mild erythema of the oropharynx.  No tonsillar exudates, lesions of the mouth, and she was tolerating her secretions with no change in phonation.  During my exam she experienced a brief moment of what appeared to be choking for approximately 10 seconds.  She quickly returned to baseline without intervention, and no further airway concerns.  Reexamination of airway did not show any acute findings.  Flexible laryngoscope was used to further assess the airway and there were no acute findings (see procedure note).  Patient was still endorsing abdominal pain, and on exam did not have any significant tenderness in her abdomen soft.  She had a thorough evaluation at OSH ED [facility] yesterday with laboratory workup unremarkable and CT abdomen unremarkable.  Thus further abdominal workup is not warranted in the ED today.  I discussed the findings of the flexible laryngoscope procedure with patient and reassured that there were no airway abnormalities. She had no further episodes of apnea or choking while in the ED. I advised as needed Robaxin for spasms, and placed an ambulatory referral to ENT. Reviewed return precautions, symptom management, follow-up and discharge  instructions. All questions answered. Patient discharged stable from the ED. Discussed this case with my supervising physician, Dr. Lum who independently assessed the patient and agreed with the plan.  Patient's presentation is most consistent with acute presentation with potential threat to life or bodily function.  DDX: viral URI, influenza, bacterial pharyngitis, PTA, RPA, Ludwing's angina, Lemierre syndrome, thyroidistis, FB, tumor.  DDX: MI, CHF, myocarditis, pericarditis, tamponade, pneumonia, sepsis, acidosis, anemia, anaphylaxis, inhalation injury, asthma, COPD, PE, pneumothorax, pleural effusion, URI DDx: appendicitis,  bowel obstruction, AAA, UTI, pyelonephritis, nephrolithiasis, pancreatitis, cholecystitis, shingles, perforated bowel or ulcer, diverticulitis, mesenteric ischemia, inflammatory bowel disease, or strangulated/incarcerated hernia.     ED Disposition:  Discharge Final diagnoses:  Throat pain  Abdominal pain, unspecified abdominal location    ED Prescriptions   None           [1] No past medical history on file. [2] No past surgical history on file. [3] No family history on file. [4] Social History Tobacco Use   Smoking status: Some Days    Types: Cigarettes   Smokeless tobacco: Never  Vaping Use   Vaping status: Never Used  Substance Use Topics   Alcohol  use: Never   Drug use: Never

## 2024-04-15 NOTE — ED Notes (Signed)
 Pt remains in room sitting on bed, no respiratory distress noted. Respirations even and unlabored    Jon Vinie Purchase, RN 04/15/24 [phone]

## 2024-09-04 ENCOUNTER — Ambulatory Visit: Admitting: Dermatology
